# Patient Record
Sex: Female | Born: 1945 | Race: White | Hispanic: No | Smoking: Never smoker
Health system: Southern US, Community
[De-identification: ages and names within clinical notes are randomized; demographics above are authoritative.]

## PROBLEM LIST (undated history)

## (undated) DIAGNOSIS — E7800 Pure hypercholesterolemia, unspecified: Secondary | ICD-10-CM

## (undated) DIAGNOSIS — N63 Unspecified lump in unspecified breast: Secondary | ICD-10-CM

## (undated) DIAGNOSIS — R591 Generalized enlarged lymph nodes: Secondary | ICD-10-CM

## (undated) DIAGNOSIS — E876 Hypokalemia: Secondary | ICD-10-CM

## (undated) DIAGNOSIS — E538 Deficiency of other specified B group vitamins: Secondary | ICD-10-CM

## (undated) DIAGNOSIS — F419 Anxiety disorder, unspecified: Secondary | ICD-10-CM

## (undated) DIAGNOSIS — M25569 Pain in unspecified knee: Secondary | ICD-10-CM

## (undated) DIAGNOSIS — D649 Anemia, unspecified: Secondary | ICD-10-CM

## (undated) DIAGNOSIS — E041 Nontoxic single thyroid nodule: Secondary | ICD-10-CM

## (undated) DIAGNOSIS — E039 Hypothyroidism, unspecified: Secondary | ICD-10-CM

## (undated) DIAGNOSIS — R946 Abnormal results of thyroid function studies: Secondary | ICD-10-CM

## (undated) DIAGNOSIS — N6320 Unspecified lump in the left breast, unspecified quadrant: Secondary | ICD-10-CM

## (undated) DIAGNOSIS — L309 Dermatitis, unspecified: Secondary | ICD-10-CM

## (undated) DIAGNOSIS — M1990 Unspecified osteoarthritis, unspecified site: Secondary | ICD-10-CM

## (undated) DIAGNOSIS — Z780 Asymptomatic menopausal state: Secondary | ICD-10-CM

## (undated) DIAGNOSIS — K7469 Other cirrhosis of liver: Secondary | ICD-10-CM

## (undated) DIAGNOSIS — Z853 Personal history of malignant neoplasm of breast: Secondary | ICD-10-CM

## (undated) DIAGNOSIS — I10 Essential (primary) hypertension: Secondary | ICD-10-CM

## (undated) DIAGNOSIS — C8800 Waldenstrom macroglobulinemia not having achieved remission: Secondary | ICD-10-CM

## (undated) DIAGNOSIS — T4145XA Adverse effect of unspecified anesthetic, initial encounter: Secondary | ICD-10-CM

## (undated) DIAGNOSIS — G4700 Insomnia, unspecified: Secondary | ICD-10-CM

## (undated) DIAGNOSIS — R188 Other ascites: Secondary | ICD-10-CM

## (undated) DIAGNOSIS — M1711 Unilateral primary osteoarthritis, right knee: Secondary | ICD-10-CM

## (undated) DIAGNOSIS — M545 Low back pain, unspecified: Secondary | ICD-10-CM

## (undated) DIAGNOSIS — D472 Monoclonal gammopathy: Secondary | ICD-10-CM

## (undated) DIAGNOSIS — M75120 Complete rotator cuff tear or rupture of unspecified shoulder, not specified as traumatic: Secondary | ICD-10-CM

## (undated) DIAGNOSIS — K8020 Calculus of gallbladder without cholecystitis without obstruction: Secondary | ICD-10-CM

## (undated) DIAGNOSIS — D72829 Elevated white blood cell count, unspecified: Secondary | ICD-10-CM

## (undated) DIAGNOSIS — G8929 Other chronic pain: Secondary | ICD-10-CM

## (undated) DIAGNOSIS — E875 Hyperkalemia: Secondary | ICD-10-CM

## (undated) DIAGNOSIS — R413 Other amnesia: Secondary | ICD-10-CM

## (undated) DIAGNOSIS — R0689 Other abnormalities of breathing: Secondary | ICD-10-CM

## (undated) DIAGNOSIS — R7989 Other specified abnormal findings of blood chemistry: Secondary | ICD-10-CM

## (undated) DIAGNOSIS — R9431 Abnormal electrocardiogram [ECG] [EKG]: Secondary | ICD-10-CM

## (undated) DIAGNOSIS — S32040A Wedge compression fracture of fourth lumbar vertebra, initial encounter for closed fracture: Secondary | ICD-10-CM

## (undated) DIAGNOSIS — T8859XA Other complications of anesthesia, initial encounter: Secondary | ICD-10-CM

## (undated) DIAGNOSIS — N289 Disorder of kidney and ureter, unspecified: Secondary | ICD-10-CM

## (undated) DIAGNOSIS — M549 Dorsalgia, unspecified: Secondary | ICD-10-CM

## (undated) DIAGNOSIS — D7589 Other specified diseases of blood and blood-forming organs: Secondary | ICD-10-CM

## (undated) DIAGNOSIS — R945 Abnormal results of liver function studies: Secondary | ICD-10-CM

## (undated) DIAGNOSIS — R609 Edema, unspecified: Secondary | ICD-10-CM

## (undated) DIAGNOSIS — K7030 Alcoholic cirrhosis of liver without ascites: Secondary | ICD-10-CM

## (undated) HISTORY — DX: Other amnesia: R41.3

## (undated) HISTORY — DX: Other cirrhosis of liver: K74.69

## (undated) HISTORY — DX: Calculus of gallbladder without cholecystitis without obstruction: K80.20

## (undated) HISTORY — DX: Elevated white blood cell count, unspecified: D72.829

## (undated) HISTORY — DX: Insomnia, unspecified: G47.00

## (undated) HISTORY — DX: Anemia, unspecified: D64.9

## (undated) HISTORY — DX: Essential (primary) hypertension: I10

## (undated) HISTORY — DX: Dermatitis, unspecified: L30.9

## (undated) HISTORY — DX: Hyperkalemia: E87.5

## (undated) HISTORY — PX: JOINT REPLACEMENT: SHX530

## (undated) HISTORY — DX: Complete rotator cuff tear or rupture of unspecified shoulder, not specified as traumatic: M75.120

## (undated) HISTORY — PX: TONSILLECTOMY: SUR1361

## (undated) HISTORY — PX: SKIN CANCER EXCISION: SHX779

## (undated) HISTORY — DX: Disorder of kidney and ureter, unspecified: N28.9

## (undated) HISTORY — DX: Low back pain, unspecified: M54.50

## (undated) HISTORY — DX: Waldenstrom macroglobulinemia: C88.0

## (undated) HISTORY — DX: Unspecified lump in the left breast, unspecified quadrant: N63.20

## (undated) HISTORY — DX: Unspecified lump in unspecified breast: N63.0

## (undated) HISTORY — DX: Monoclonal gammopathy: D47.2

## (undated) HISTORY — DX: Asymptomatic menopausal state: Z78.0

## (undated) HISTORY — DX: Unspecified osteoarthritis, unspecified site: M19.90

## (undated) HISTORY — DX: Abnormal electrocardiogram (ECG) (EKG): R94.31

## (undated) HISTORY — DX: Wedge compression fracture of fourth lumbar vertebra, initial encounter for closed fracture: S32.040A

## (undated) HISTORY — DX: Pure hypercholesterolemia, unspecified: E78.00

## (undated) HISTORY — DX: Hypothyroidism, unspecified: E03.9

## (undated) HISTORY — DX: Deficiency of other specified B group vitamins: E53.8

## (undated) HISTORY — DX: Other chronic pain: G89.29

## (undated) HISTORY — DX: Abnormal results of liver function studies: R94.5

## (undated) HISTORY — DX: Alcoholic cirrhosis of liver without ascites: K70.30

## (undated) HISTORY — DX: Pain in unspecified knee: M25.569

## (undated) HISTORY — DX: Abnormal results of thyroid function studies: R94.6

## (undated) HISTORY — DX: Generalized enlarged lymph nodes: R59.1

## (undated) HISTORY — DX: Other ascites: R18.8

## (undated) HISTORY — DX: Other abnormalities of breathing: R06.89

## (undated) HISTORY — DX: Nontoxic single thyroid nodule: E04.1

## (undated) HISTORY — DX: Unilateral primary osteoarthritis, right knee: M17.11

## (undated) HISTORY — DX: Low back pain: M54.5

## (undated) HISTORY — PX: OTHER SURGICAL HISTORY: SHX169

## (undated) HISTORY — DX: Waldenstrom macroglobulinemia not having achieved remission: C88.00

## (undated) HISTORY — DX: Edema, unspecified: R60.9

## (undated) HISTORY — DX: Other specified abnormal findings of blood chemistry: R79.89

## (undated) HISTORY — DX: Other specified diseases of blood and blood-forming organs: D75.89

## (undated) HISTORY — DX: Dorsalgia, unspecified: M54.9

## (undated) HISTORY — DX: Anxiety disorder, unspecified: F41.9

## (undated) HISTORY — DX: Hypokalemia: E87.6

## (undated) HISTORY — DX: Personal history of malignant neoplasm of breast: Z85.3

---

## 2004-12-24 HISTORY — PX: EYE SURGERY: SHX253

## 2012-12-24 HISTORY — PX: BREAST SURGERY: SHX581

## 2015-09-24 HISTORY — PX: LESION REMOVAL: SHX5196

## 2015-09-27 LAB — CBC AND DIFFERENTIAL
HEMATOCRIT: 34 % — AB (ref 36–46)
HEMOGLOBIN: 11.4 g/dL — AB (ref 12.0–16.0)
Platelets: 176 10*3/uL (ref 150–399)
WBC: 6.5 10^3/mL

## 2015-09-27 LAB — HEPATIC FUNCTION PANEL
ALT: 22 U/L (ref 7–35)
AST: 41 U/L — AB (ref 13–35)
Alkaline Phosphatase: 94 U/L (ref 25–125)
Bilirubin, Total: 1 mg/dL

## 2015-09-27 LAB — BASIC METABOLIC PANEL
BUN: 8 mg/dL (ref 4–21)
Creatinine: 0.5 mg/dL (ref 0.5–1.1)
Glucose: 98 mg/dL
POTASSIUM: 4 mmol/L (ref 3.4–5.3)
SODIUM: 134 mmol/L — AB (ref 137–147)

## 2015-09-27 LAB — POCT ERYTHROCYTE SEDIMENTATION RATE, NON-AUTOMATED: Sed Rate: 97 mm

## 2015-11-29 LAB — BASIC METABOLIC PANEL
BUN: 35 mg/dL — AB (ref 4–21)
Creatinine: 0.6 mg/dL (ref 0.5–1.1)
Glucose: 104 mg/dL
Potassium: 4.4 mmol/L (ref 3.4–5.3)
Sodium: 134 mmol/L — AB (ref 137–147)

## 2015-11-29 LAB — HEPATIC FUNCTION PANEL
ALT: 21 U/L (ref 7–35)
AST: 27 U/L (ref 13–35)
Alkaline Phosphatase: 107 U/L (ref 25–125)
Bilirubin, Total: 1.1 mg/dL

## 2015-11-29 LAB — CBC AND DIFFERENTIAL
HCT: 41 % (ref 36–46)
HEMOGLOBIN: 13.5 g/dL (ref 12.0–16.0)
PLATELETS: 143 10*3/uL — AB (ref 150–399)
WBC: 7.6 10*3/mL

## 2016-01-17 DIAGNOSIS — M545 Low back pain: Secondary | ICD-10-CM | POA: Diagnosis not present

## 2016-01-23 DIAGNOSIS — M545 Low back pain: Secondary | ICD-10-CM | POA: Diagnosis not present

## 2016-01-23 DIAGNOSIS — G8929 Other chronic pain: Secondary | ICD-10-CM | POA: Diagnosis not present

## 2016-01-23 DIAGNOSIS — E039 Hypothyroidism, unspecified: Secondary | ICD-10-CM | POA: Diagnosis not present

## 2016-01-23 LAB — TSH: TSH: 0.1 u[IU]/mL — AB (ref 0.41–5.90)

## 2016-01-25 DIAGNOSIS — M545 Low back pain: Secondary | ICD-10-CM | POA: Diagnosis not present

## 2016-01-25 DIAGNOSIS — D519 Vitamin B12 deficiency anemia, unspecified: Secondary | ICD-10-CM | POA: Diagnosis not present

## 2016-01-25 DIAGNOSIS — K7581 Nonalcoholic steatohepatitis (NASH): Secondary | ICD-10-CM | POA: Diagnosis not present

## 2016-01-25 DIAGNOSIS — Z853 Personal history of malignant neoplasm of breast: Secondary | ICD-10-CM | POA: Diagnosis not present

## 2016-01-27 DIAGNOSIS — D519 Vitamin B12 deficiency anemia, unspecified: Secondary | ICD-10-CM | POA: Diagnosis not present

## 2016-01-27 DIAGNOSIS — M545 Low back pain: Secondary | ICD-10-CM | POA: Diagnosis not present

## 2016-01-27 DIAGNOSIS — K7581 Nonalcoholic steatohepatitis (NASH): Secondary | ICD-10-CM | POA: Diagnosis not present

## 2016-01-27 DIAGNOSIS — Z853 Personal history of malignant neoplasm of breast: Secondary | ICD-10-CM | POA: Diagnosis not present

## 2016-01-30 DIAGNOSIS — D519 Vitamin B12 deficiency anemia, unspecified: Secondary | ICD-10-CM | POA: Diagnosis not present

## 2016-01-30 DIAGNOSIS — K7581 Nonalcoholic steatohepatitis (NASH): Secondary | ICD-10-CM | POA: Diagnosis not present

## 2016-01-30 DIAGNOSIS — Z853 Personal history of malignant neoplasm of breast: Secondary | ICD-10-CM | POA: Diagnosis not present

## 2016-01-30 DIAGNOSIS — M545 Low back pain: Secondary | ICD-10-CM | POA: Diagnosis not present

## 2016-02-01 DIAGNOSIS — D519 Vitamin B12 deficiency anemia, unspecified: Secondary | ICD-10-CM | POA: Diagnosis not present

## 2016-02-01 DIAGNOSIS — M545 Low back pain: Secondary | ICD-10-CM | POA: Diagnosis not present

## 2016-02-01 DIAGNOSIS — K7581 Nonalcoholic steatohepatitis (NASH): Secondary | ICD-10-CM | POA: Diagnosis not present

## 2016-02-01 DIAGNOSIS — Z853 Personal history of malignant neoplasm of breast: Secondary | ICD-10-CM | POA: Diagnosis not present

## 2016-02-10 DIAGNOSIS — M545 Low back pain: Secondary | ICD-10-CM | POA: Diagnosis not present

## 2016-02-10 DIAGNOSIS — K7581 Nonalcoholic steatohepatitis (NASH): Secondary | ICD-10-CM | POA: Diagnosis not present

## 2016-02-10 DIAGNOSIS — Z853 Personal history of malignant neoplasm of breast: Secondary | ICD-10-CM | POA: Diagnosis not present

## 2016-02-10 DIAGNOSIS — D519 Vitamin B12 deficiency anemia, unspecified: Secondary | ICD-10-CM | POA: Diagnosis not present

## 2016-02-15 DIAGNOSIS — D519 Vitamin B12 deficiency anemia, unspecified: Secondary | ICD-10-CM | POA: Diagnosis not present

## 2016-02-15 DIAGNOSIS — M545 Low back pain: Secondary | ICD-10-CM | POA: Diagnosis not present

## 2016-02-15 DIAGNOSIS — Z853 Personal history of malignant neoplasm of breast: Secondary | ICD-10-CM | POA: Diagnosis not present

## 2016-02-15 DIAGNOSIS — K7581 Nonalcoholic steatohepatitis (NASH): Secondary | ICD-10-CM | POA: Diagnosis not present

## 2016-02-17 DIAGNOSIS — D519 Vitamin B12 deficiency anemia, unspecified: Secondary | ICD-10-CM | POA: Diagnosis not present

## 2016-02-17 DIAGNOSIS — Z853 Personal history of malignant neoplasm of breast: Secondary | ICD-10-CM | POA: Diagnosis not present

## 2016-02-17 DIAGNOSIS — K7581 Nonalcoholic steatohepatitis (NASH): Secondary | ICD-10-CM | POA: Diagnosis not present

## 2016-02-17 DIAGNOSIS — M545 Low back pain: Secondary | ICD-10-CM | POA: Diagnosis not present

## 2016-02-20 DIAGNOSIS — K7581 Nonalcoholic steatohepatitis (NASH): Secondary | ICD-10-CM | POA: Diagnosis not present

## 2016-02-20 DIAGNOSIS — D519 Vitamin B12 deficiency anemia, unspecified: Secondary | ICD-10-CM | POA: Diagnosis not present

## 2016-02-20 DIAGNOSIS — M545 Low back pain: Secondary | ICD-10-CM | POA: Diagnosis not present

## 2016-02-20 DIAGNOSIS — Z853 Personal history of malignant neoplasm of breast: Secondary | ICD-10-CM | POA: Diagnosis not present

## 2016-02-22 DIAGNOSIS — M545 Low back pain: Secondary | ICD-10-CM | POA: Diagnosis not present

## 2016-02-22 DIAGNOSIS — D519 Vitamin B12 deficiency anemia, unspecified: Secondary | ICD-10-CM | POA: Diagnosis not present

## 2016-02-22 DIAGNOSIS — K7581 Nonalcoholic steatohepatitis (NASH): Secondary | ICD-10-CM | POA: Diagnosis not present

## 2016-02-22 DIAGNOSIS — Z853 Personal history of malignant neoplasm of breast: Secondary | ICD-10-CM | POA: Diagnosis not present

## 2016-02-27 DIAGNOSIS — E039 Hypothyroidism, unspecified: Secondary | ICD-10-CM | POA: Diagnosis not present

## 2016-02-27 DIAGNOSIS — R413 Other amnesia: Secondary | ICD-10-CM | POA: Diagnosis not present

## 2016-02-29 DIAGNOSIS — K7581 Nonalcoholic steatohepatitis (NASH): Secondary | ICD-10-CM | POA: Diagnosis not present

## 2016-02-29 DIAGNOSIS — D519 Vitamin B12 deficiency anemia, unspecified: Secondary | ICD-10-CM | POA: Diagnosis not present

## 2016-02-29 DIAGNOSIS — M545 Low back pain: Secondary | ICD-10-CM | POA: Diagnosis not present

## 2016-02-29 DIAGNOSIS — Z853 Personal history of malignant neoplasm of breast: Secondary | ICD-10-CM | POA: Diagnosis not present

## 2016-03-12 DIAGNOSIS — K766 Portal hypertension: Secondary | ICD-10-CM | POA: Diagnosis not present

## 2016-03-12 DIAGNOSIS — K7031 Alcoholic cirrhosis of liver with ascites: Secondary | ICD-10-CM | POA: Diagnosis not present

## 2016-04-03 DIAGNOSIS — K824 Cholesterolosis of gallbladder: Secondary | ICD-10-CM | POA: Diagnosis not present

## 2016-04-03 DIAGNOSIS — K7469 Other cirrhosis of liver: Secondary | ICD-10-CM | POA: Diagnosis not present

## 2016-04-03 DIAGNOSIS — K8020 Calculus of gallbladder without cholecystitis without obstruction: Secondary | ICD-10-CM | POA: Diagnosis not present

## 2016-04-03 DIAGNOSIS — R188 Other ascites: Secondary | ICD-10-CM | POA: Diagnosis not present

## 2016-04-10 DIAGNOSIS — Z853 Personal history of malignant neoplasm of breast: Secondary | ICD-10-CM | POA: Diagnosis not present

## 2016-04-10 DIAGNOSIS — C880 Waldenstrom macroglobulinemia: Secondary | ICD-10-CM | POA: Diagnosis not present

## 2016-04-10 DIAGNOSIS — E039 Hypothyroidism, unspecified: Secondary | ICD-10-CM | POA: Diagnosis not present

## 2016-04-10 DIAGNOSIS — R188 Other ascites: Secondary | ICD-10-CM | POA: Diagnosis not present

## 2016-04-10 LAB — BASIC METABOLIC PANEL
BUN: 23 mg/dL — AB (ref 4–21)
Creatinine: 0.7 mg/dL (ref 0.5–1.1)
GLUCOSE: 98 mg/dL
POTASSIUM: 4.2 mmol/L (ref 3.4–5.3)
Sodium: 137 mmol/L (ref 137–147)

## 2016-04-10 LAB — CBC AND DIFFERENTIAL
HEMATOCRIT: 38 % (ref 36–46)
Hemoglobin: 12.8 g/dL (ref 12.0–16.0)
Platelets: 157 10*3/uL (ref 150–399)
WBC: 6.1 10*3/mL

## 2016-04-10 LAB — POCT ERYTHROCYTE SEDIMENTATION RATE, NON-AUTOMATED: SED RATE: 81 mm

## 2016-04-30 DIAGNOSIS — N63 Unspecified lump in breast: Secondary | ICD-10-CM | POA: Diagnosis not present

## 2016-04-30 DIAGNOSIS — Z853 Personal history of malignant neoplasm of breast: Secondary | ICD-10-CM | POA: Diagnosis not present

## 2016-04-30 DIAGNOSIS — Z9889 Other specified postprocedural states: Secondary | ICD-10-CM | POA: Diagnosis not present

## 2016-04-30 DIAGNOSIS — Z923 Personal history of irradiation: Secondary | ICD-10-CM | POA: Diagnosis not present

## 2016-04-30 LAB — HM MAMMOGRAPHY

## 2016-07-02 DIAGNOSIS — R609 Edema, unspecified: Secondary | ICD-10-CM | POA: Diagnosis not present

## 2016-07-02 DIAGNOSIS — R188 Other ascites: Secondary | ICD-10-CM | POA: Diagnosis not present

## 2016-07-02 DIAGNOSIS — K766 Portal hypertension: Secondary | ICD-10-CM | POA: Diagnosis not present

## 2016-07-02 DIAGNOSIS — K7030 Alcoholic cirrhosis of liver without ascites: Secondary | ICD-10-CM | POA: Diagnosis not present

## 2016-07-02 HISTORY — DX: Alcoholic cirrhosis of liver without ascites: K70.30

## 2016-07-10 DIAGNOSIS — C880 Waldenstrom macroglobulinemia: Secondary | ICD-10-CM | POA: Diagnosis not present

## 2016-07-10 DIAGNOSIS — D472 Monoclonal gammopathy: Secondary | ICD-10-CM | POA: Diagnosis not present

## 2016-07-10 DIAGNOSIS — R188 Other ascites: Secondary | ICD-10-CM | POA: Diagnosis not present

## 2016-07-10 DIAGNOSIS — M549 Dorsalgia, unspecified: Secondary | ICD-10-CM | POA: Diagnosis not present

## 2016-07-10 DIAGNOSIS — Z853 Personal history of malignant neoplasm of breast: Secondary | ICD-10-CM | POA: Diagnosis not present

## 2016-07-10 LAB — POCT ERYTHROCYTE SEDIMENTATION RATE, NON-AUTOMATED: Sed Rate: 83 mm

## 2016-07-10 LAB — CBC AND DIFFERENTIAL
HCT: 38 % (ref 36–46)
Hemoglobin: 13 g/dL (ref 12.0–16.0)
Platelets: 162 10*3/uL (ref 150–399)
WBC: 7.3 10*3/mL

## 2016-08-01 DIAGNOSIS — E538 Deficiency of other specified B group vitamins: Secondary | ICD-10-CM | POA: Diagnosis not present

## 2016-08-01 DIAGNOSIS — M25511 Pain in right shoulder: Secondary | ICD-10-CM | POA: Diagnosis not present

## 2016-08-07 DIAGNOSIS — Z780 Asymptomatic menopausal state: Secondary | ICD-10-CM | POA: Diagnosis not present

## 2016-08-07 LAB — HM DEXA SCAN

## 2016-08-09 DIAGNOSIS — M25511 Pain in right shoulder: Secondary | ICD-10-CM | POA: Diagnosis not present

## 2016-08-09 DIAGNOSIS — M75121 Complete rotator cuff tear or rupture of right shoulder, not specified as traumatic: Secondary | ICD-10-CM | POA: Diagnosis not present

## 2016-08-09 DIAGNOSIS — M1711 Unilateral primary osteoarthritis, right knee: Secondary | ICD-10-CM | POA: Diagnosis not present

## 2016-08-30 DIAGNOSIS — M1711 Unilateral primary osteoarthritis, right knee: Secondary | ICD-10-CM | POA: Diagnosis not present

## 2016-09-05 DIAGNOSIS — M1711 Unilateral primary osteoarthritis, right knee: Secondary | ICD-10-CM | POA: Diagnosis present

## 2016-09-05 DIAGNOSIS — M25561 Pain in right knee: Secondary | ICD-10-CM | POA: Diagnosis not present

## 2016-09-05 DIAGNOSIS — G8918 Other acute postprocedural pain: Secondary | ICD-10-CM | POA: Diagnosis not present

## 2016-09-05 DIAGNOSIS — Z23 Encounter for immunization: Secondary | ICD-10-CM | POA: Diagnosis not present

## 2016-09-05 HISTORY — PX: TOTAL KNEE ARTHROPLASTY: SHX125

## 2016-09-08 DIAGNOSIS — Z471 Aftercare following joint replacement surgery: Secondary | ICD-10-CM | POA: Diagnosis not present

## 2016-09-08 DIAGNOSIS — M1711 Unilateral primary osteoarthritis, right knee: Secondary | ICD-10-CM | POA: Diagnosis not present

## 2016-09-08 DIAGNOSIS — D519 Vitamin B12 deficiency anemia, unspecified: Secondary | ICD-10-CM | POA: Diagnosis not present

## 2016-09-08 DIAGNOSIS — Z96651 Presence of right artificial knee joint: Secondary | ICD-10-CM | POA: Diagnosis not present

## 2016-09-08 DIAGNOSIS — Z853 Personal history of malignant neoplasm of breast: Secondary | ICD-10-CM | POA: Diagnosis not present

## 2016-09-08 DIAGNOSIS — K7581 Nonalcoholic steatohepatitis (NASH): Secondary | ICD-10-CM | POA: Diagnosis not present

## 2016-09-10 DIAGNOSIS — M1711 Unilateral primary osteoarthritis, right knee: Secondary | ICD-10-CM | POA: Diagnosis not present

## 2016-09-10 DIAGNOSIS — Z471 Aftercare following joint replacement surgery: Secondary | ICD-10-CM | POA: Diagnosis not present

## 2016-09-10 DIAGNOSIS — Z853 Personal history of malignant neoplasm of breast: Secondary | ICD-10-CM | POA: Diagnosis not present

## 2016-09-10 DIAGNOSIS — D519 Vitamin B12 deficiency anemia, unspecified: Secondary | ICD-10-CM | POA: Diagnosis not present

## 2016-09-10 DIAGNOSIS — K7581 Nonalcoholic steatohepatitis (NASH): Secondary | ICD-10-CM | POA: Diagnosis not present

## 2016-09-10 DIAGNOSIS — Z96651 Presence of right artificial knee joint: Secondary | ICD-10-CM | POA: Diagnosis not present

## 2016-09-12 DIAGNOSIS — D519 Vitamin B12 deficiency anemia, unspecified: Secondary | ICD-10-CM | POA: Diagnosis not present

## 2016-09-12 DIAGNOSIS — Z471 Aftercare following joint replacement surgery: Secondary | ICD-10-CM | POA: Diagnosis not present

## 2016-09-12 DIAGNOSIS — Z96651 Presence of right artificial knee joint: Secondary | ICD-10-CM | POA: Diagnosis not present

## 2016-09-12 DIAGNOSIS — K7581 Nonalcoholic steatohepatitis (NASH): Secondary | ICD-10-CM | POA: Diagnosis not present

## 2016-09-12 DIAGNOSIS — M1711 Unilateral primary osteoarthritis, right knee: Secondary | ICD-10-CM | POA: Diagnosis not present

## 2016-09-12 DIAGNOSIS — Z853 Personal history of malignant neoplasm of breast: Secondary | ICD-10-CM | POA: Diagnosis not present

## 2016-09-14 DIAGNOSIS — K7581 Nonalcoholic steatohepatitis (NASH): Secondary | ICD-10-CM | POA: Diagnosis not present

## 2016-09-14 DIAGNOSIS — D519 Vitamin B12 deficiency anemia, unspecified: Secondary | ICD-10-CM | POA: Diagnosis not present

## 2016-09-14 DIAGNOSIS — Z96651 Presence of right artificial knee joint: Secondary | ICD-10-CM | POA: Diagnosis not present

## 2016-09-14 DIAGNOSIS — Z853 Personal history of malignant neoplasm of breast: Secondary | ICD-10-CM | POA: Diagnosis not present

## 2016-09-14 DIAGNOSIS — M1711 Unilateral primary osteoarthritis, right knee: Secondary | ICD-10-CM | POA: Diagnosis not present

## 2016-09-14 DIAGNOSIS — Z471 Aftercare following joint replacement surgery: Secondary | ICD-10-CM | POA: Diagnosis not present

## 2016-09-17 DIAGNOSIS — Z96651 Presence of right artificial knee joint: Secondary | ICD-10-CM | POA: Diagnosis not present

## 2016-09-17 DIAGNOSIS — Z471 Aftercare following joint replacement surgery: Secondary | ICD-10-CM | POA: Diagnosis not present

## 2016-09-17 DIAGNOSIS — K7581 Nonalcoholic steatohepatitis (NASH): Secondary | ICD-10-CM | POA: Diagnosis not present

## 2016-09-17 DIAGNOSIS — M1711 Unilateral primary osteoarthritis, right knee: Secondary | ICD-10-CM | POA: Diagnosis not present

## 2016-09-17 DIAGNOSIS — Z853 Personal history of malignant neoplasm of breast: Secondary | ICD-10-CM | POA: Diagnosis not present

## 2016-09-17 DIAGNOSIS — D519 Vitamin B12 deficiency anemia, unspecified: Secondary | ICD-10-CM | POA: Diagnosis not present

## 2016-09-19 DIAGNOSIS — D519 Vitamin B12 deficiency anemia, unspecified: Secondary | ICD-10-CM | POA: Diagnosis not present

## 2016-09-19 DIAGNOSIS — K7581 Nonalcoholic steatohepatitis (NASH): Secondary | ICD-10-CM | POA: Diagnosis not present

## 2016-09-19 DIAGNOSIS — Z471 Aftercare following joint replacement surgery: Secondary | ICD-10-CM | POA: Diagnosis not present

## 2016-09-19 DIAGNOSIS — Z96651 Presence of right artificial knee joint: Secondary | ICD-10-CM | POA: Diagnosis not present

## 2016-09-19 DIAGNOSIS — M1711 Unilateral primary osteoarthritis, right knee: Secondary | ICD-10-CM | POA: Diagnosis not present

## 2016-09-19 DIAGNOSIS — Z853 Personal history of malignant neoplasm of breast: Secondary | ICD-10-CM | POA: Diagnosis not present

## 2016-09-20 DIAGNOSIS — Z853 Personal history of malignant neoplasm of breast: Secondary | ICD-10-CM | POA: Diagnosis not present

## 2016-09-20 DIAGNOSIS — Z471 Aftercare following joint replacement surgery: Secondary | ICD-10-CM | POA: Diagnosis not present

## 2016-09-20 DIAGNOSIS — M1711 Unilateral primary osteoarthritis, right knee: Secondary | ICD-10-CM | POA: Diagnosis not present

## 2016-09-20 DIAGNOSIS — Z96651 Presence of right artificial knee joint: Secondary | ICD-10-CM | POA: Diagnosis not present

## 2016-09-20 DIAGNOSIS — D519 Vitamin B12 deficiency anemia, unspecified: Secondary | ICD-10-CM | POA: Diagnosis not present

## 2016-09-20 DIAGNOSIS — K7581 Nonalcoholic steatohepatitis (NASH): Secondary | ICD-10-CM | POA: Diagnosis not present

## 2016-09-24 DIAGNOSIS — D519 Vitamin B12 deficiency anemia, unspecified: Secondary | ICD-10-CM | POA: Diagnosis not present

## 2016-09-24 DIAGNOSIS — K7581 Nonalcoholic steatohepatitis (NASH): Secondary | ICD-10-CM | POA: Diagnosis not present

## 2016-09-24 DIAGNOSIS — M1711 Unilateral primary osteoarthritis, right knee: Secondary | ICD-10-CM | POA: Diagnosis not present

## 2016-09-24 DIAGNOSIS — Z471 Aftercare following joint replacement surgery: Secondary | ICD-10-CM | POA: Diagnosis not present

## 2016-09-24 DIAGNOSIS — Z96651 Presence of right artificial knee joint: Secondary | ICD-10-CM | POA: Diagnosis not present

## 2016-09-24 DIAGNOSIS — Z853 Personal history of malignant neoplasm of breast: Secondary | ICD-10-CM | POA: Diagnosis not present

## 2016-09-26 DIAGNOSIS — M1711 Unilateral primary osteoarthritis, right knee: Secondary | ICD-10-CM | POA: Diagnosis not present

## 2016-09-26 DIAGNOSIS — Z471 Aftercare following joint replacement surgery: Secondary | ICD-10-CM | POA: Diagnosis not present

## 2016-09-26 DIAGNOSIS — D519 Vitamin B12 deficiency anemia, unspecified: Secondary | ICD-10-CM | POA: Diagnosis not present

## 2016-09-26 DIAGNOSIS — Z96651 Presence of right artificial knee joint: Secondary | ICD-10-CM | POA: Diagnosis not present

## 2016-09-26 DIAGNOSIS — Z853 Personal history of malignant neoplasm of breast: Secondary | ICD-10-CM | POA: Diagnosis not present

## 2016-09-26 DIAGNOSIS — K7581 Nonalcoholic steatohepatitis (NASH): Secondary | ICD-10-CM | POA: Diagnosis not present

## 2016-09-28 DIAGNOSIS — K7581 Nonalcoholic steatohepatitis (NASH): Secondary | ICD-10-CM | POA: Diagnosis not present

## 2016-09-28 DIAGNOSIS — Z471 Aftercare following joint replacement surgery: Secondary | ICD-10-CM | POA: Diagnosis not present

## 2016-09-28 DIAGNOSIS — Z853 Personal history of malignant neoplasm of breast: Secondary | ICD-10-CM | POA: Diagnosis not present

## 2016-09-28 DIAGNOSIS — D519 Vitamin B12 deficiency anemia, unspecified: Secondary | ICD-10-CM | POA: Diagnosis not present

## 2016-09-28 DIAGNOSIS — Z96651 Presence of right artificial knee joint: Secondary | ICD-10-CM | POA: Diagnosis not present

## 2016-09-28 DIAGNOSIS — M1711 Unilateral primary osteoarthritis, right knee: Secondary | ICD-10-CM | POA: Diagnosis not present

## 2016-10-01 DIAGNOSIS — Z96651 Presence of right artificial knee joint: Secondary | ICD-10-CM | POA: Diagnosis not present

## 2016-10-01 DIAGNOSIS — D519 Vitamin B12 deficiency anemia, unspecified: Secondary | ICD-10-CM | POA: Diagnosis not present

## 2016-10-01 DIAGNOSIS — Z471 Aftercare following joint replacement surgery: Secondary | ICD-10-CM | POA: Diagnosis not present

## 2016-10-01 DIAGNOSIS — M1711 Unilateral primary osteoarthritis, right knee: Secondary | ICD-10-CM | POA: Diagnosis not present

## 2016-10-01 DIAGNOSIS — Z853 Personal history of malignant neoplasm of breast: Secondary | ICD-10-CM | POA: Diagnosis not present

## 2016-10-01 DIAGNOSIS — K7581 Nonalcoholic steatohepatitis (NASH): Secondary | ICD-10-CM | POA: Diagnosis not present

## 2016-10-03 DIAGNOSIS — Z96651 Presence of right artificial knee joint: Secondary | ICD-10-CM | POA: Diagnosis not present

## 2016-10-03 DIAGNOSIS — D519 Vitamin B12 deficiency anemia, unspecified: Secondary | ICD-10-CM | POA: Diagnosis not present

## 2016-10-03 DIAGNOSIS — K7581 Nonalcoholic steatohepatitis (NASH): Secondary | ICD-10-CM | POA: Diagnosis not present

## 2016-10-03 DIAGNOSIS — Z853 Personal history of malignant neoplasm of breast: Secondary | ICD-10-CM | POA: Diagnosis not present

## 2016-10-03 DIAGNOSIS — M1711 Unilateral primary osteoarthritis, right knee: Secondary | ICD-10-CM | POA: Diagnosis not present

## 2016-10-03 DIAGNOSIS — Z471 Aftercare following joint replacement surgery: Secondary | ICD-10-CM | POA: Diagnosis not present

## 2016-10-05 DIAGNOSIS — Z471 Aftercare following joint replacement surgery: Secondary | ICD-10-CM | POA: Diagnosis not present

## 2016-10-05 DIAGNOSIS — D519 Vitamin B12 deficiency anemia, unspecified: Secondary | ICD-10-CM | POA: Diagnosis not present

## 2016-10-05 DIAGNOSIS — Z853 Personal history of malignant neoplasm of breast: Secondary | ICD-10-CM | POA: Diagnosis not present

## 2016-10-05 DIAGNOSIS — K7581 Nonalcoholic steatohepatitis (NASH): Secondary | ICD-10-CM | POA: Diagnosis not present

## 2016-10-05 DIAGNOSIS — M1711 Unilateral primary osteoarthritis, right knee: Secondary | ICD-10-CM | POA: Diagnosis not present

## 2016-10-05 DIAGNOSIS — Z96651 Presence of right artificial knee joint: Secondary | ICD-10-CM | POA: Diagnosis not present

## 2016-10-11 DIAGNOSIS — M1711 Unilateral primary osteoarthritis, right knee: Secondary | ICD-10-CM | POA: Diagnosis not present

## 2016-10-11 DIAGNOSIS — D519 Vitamin B12 deficiency anemia, unspecified: Secondary | ICD-10-CM | POA: Diagnosis not present

## 2016-10-11 DIAGNOSIS — K7581 Nonalcoholic steatohepatitis (NASH): Secondary | ICD-10-CM | POA: Diagnosis not present

## 2016-10-11 DIAGNOSIS — Z96651 Presence of right artificial knee joint: Secondary | ICD-10-CM | POA: Diagnosis not present

## 2016-10-11 DIAGNOSIS — Z853 Personal history of malignant neoplasm of breast: Secondary | ICD-10-CM | POA: Diagnosis not present

## 2016-10-11 DIAGNOSIS — Z471 Aftercare following joint replacement surgery: Secondary | ICD-10-CM | POA: Diagnosis not present

## 2016-10-12 DIAGNOSIS — M1711 Unilateral primary osteoarthritis, right knee: Secondary | ICD-10-CM | POA: Diagnosis not present

## 2016-10-12 DIAGNOSIS — Z853 Personal history of malignant neoplasm of breast: Secondary | ICD-10-CM | POA: Diagnosis not present

## 2016-10-12 DIAGNOSIS — K7581 Nonalcoholic steatohepatitis (NASH): Secondary | ICD-10-CM | POA: Diagnosis not present

## 2016-10-12 DIAGNOSIS — D519 Vitamin B12 deficiency anemia, unspecified: Secondary | ICD-10-CM | POA: Diagnosis not present

## 2016-10-12 DIAGNOSIS — Z471 Aftercare following joint replacement surgery: Secondary | ICD-10-CM | POA: Diagnosis not present

## 2016-10-12 DIAGNOSIS — Z96651 Presence of right artificial knee joint: Secondary | ICD-10-CM | POA: Diagnosis not present

## 2016-10-15 DIAGNOSIS — D519 Vitamin B12 deficiency anemia, unspecified: Secondary | ICD-10-CM | POA: Diagnosis not present

## 2016-10-15 DIAGNOSIS — Z96651 Presence of right artificial knee joint: Secondary | ICD-10-CM | POA: Diagnosis not present

## 2016-10-15 DIAGNOSIS — M1711 Unilateral primary osteoarthritis, right knee: Secondary | ICD-10-CM | POA: Diagnosis not present

## 2016-10-15 DIAGNOSIS — K7581 Nonalcoholic steatohepatitis (NASH): Secondary | ICD-10-CM | POA: Diagnosis not present

## 2016-10-15 DIAGNOSIS — Z471 Aftercare following joint replacement surgery: Secondary | ICD-10-CM | POA: Diagnosis not present

## 2016-10-15 DIAGNOSIS — Z853 Personal history of malignant neoplasm of breast: Secondary | ICD-10-CM | POA: Diagnosis not present

## 2016-10-19 DIAGNOSIS — K7581 Nonalcoholic steatohepatitis (NASH): Secondary | ICD-10-CM | POA: Diagnosis not present

## 2016-10-19 DIAGNOSIS — Z96651 Presence of right artificial knee joint: Secondary | ICD-10-CM | POA: Diagnosis not present

## 2016-10-19 DIAGNOSIS — M1711 Unilateral primary osteoarthritis, right knee: Secondary | ICD-10-CM | POA: Diagnosis not present

## 2016-10-19 DIAGNOSIS — Z853 Personal history of malignant neoplasm of breast: Secondary | ICD-10-CM | POA: Diagnosis not present

## 2016-10-19 DIAGNOSIS — Z471 Aftercare following joint replacement surgery: Secondary | ICD-10-CM | POA: Diagnosis not present

## 2016-10-19 DIAGNOSIS — D519 Vitamin B12 deficiency anemia, unspecified: Secondary | ICD-10-CM | POA: Diagnosis not present

## 2016-11-09 DIAGNOSIS — K7030 Alcoholic cirrhosis of liver without ascites: Secondary | ICD-10-CM | POA: Diagnosis not present

## 2016-11-09 DIAGNOSIS — N200 Calculus of kidney: Secondary | ICD-10-CM | POA: Diagnosis not present

## 2016-11-23 DIAGNOSIS — E039 Hypothyroidism, unspecified: Secondary | ICD-10-CM | POA: Diagnosis not present

## 2016-11-30 DIAGNOSIS — Z96651 Presence of right artificial knee joint: Secondary | ICD-10-CM | POA: Diagnosis not present

## 2016-11-30 DIAGNOSIS — M25511 Pain in right shoulder: Secondary | ICD-10-CM | POA: Diagnosis not present

## 2016-12-07 DIAGNOSIS — D7589 Other specified diseases of blood and blood-forming organs: Secondary | ICD-10-CM | POA: Diagnosis not present

## 2016-12-07 DIAGNOSIS — C880 Waldenstrom macroglobulinemia: Secondary | ICD-10-CM | POA: Diagnosis not present

## 2016-12-07 DIAGNOSIS — K7469 Other cirrhosis of liver: Secondary | ICD-10-CM | POA: Diagnosis not present

## 2016-12-07 DIAGNOSIS — D72829 Elevated white blood cell count, unspecified: Secondary | ICD-10-CM | POA: Diagnosis not present

## 2016-12-07 DIAGNOSIS — Z853 Personal history of malignant neoplasm of breast: Secondary | ICD-10-CM | POA: Diagnosis not present

## 2016-12-07 LAB — HEPATIC FUNCTION PANEL
ALK PHOS: 103 U/L (ref 25–125)
ALT: 51 U/L — AB (ref 7–35)
AST: 33 U/L (ref 13–35)
Bilirubin, Total: 0.5 mg/dL

## 2016-12-07 LAB — CBC AND DIFFERENTIAL
HCT: 44 % (ref 36–46)
Hemoglobin: 14.7 g/dL (ref 12.0–16.0)
Platelets: 276 10*3/uL (ref 150–399)
WBC: 15.4 10^3/mL

## 2016-12-07 LAB — BASIC METABOLIC PANEL
BUN: 18 mg/dL (ref 4–21)
CREATININE: 0.7 mg/dL (ref 0.5–1.1)
Glucose: 111 mg/dL
Potassium: 5 mmol/L (ref 3.4–5.3)
SODIUM: 134 mmol/L — AB (ref 137–147)

## 2016-12-07 LAB — TSH: TSH: 1.5 u[IU]/mL (ref 0.41–5.90)

## 2016-12-10 DIAGNOSIS — Z0000 Encounter for general adult medical examination without abnormal findings: Secondary | ICD-10-CM | POA: Diagnosis not present

## 2017-01-04 DIAGNOSIS — K7030 Alcoholic cirrhosis of liver without ascites: Secondary | ICD-10-CM | POA: Diagnosis not present

## 2017-01-04 DIAGNOSIS — R188 Other ascites: Secondary | ICD-10-CM | POA: Diagnosis not present

## 2017-01-04 DIAGNOSIS — R609 Edema, unspecified: Secondary | ICD-10-CM | POA: Diagnosis not present

## 2017-01-04 DIAGNOSIS — K766 Portal hypertension: Secondary | ICD-10-CM | POA: Diagnosis not present

## 2017-01-14 DIAGNOSIS — M1711 Unilateral primary osteoarthritis, right knee: Secondary | ICD-10-CM | POA: Diagnosis not present

## 2017-01-14 DIAGNOSIS — M25661 Stiffness of right knee, not elsewhere classified: Secondary | ICD-10-CM | POA: Diagnosis not present

## 2017-01-14 DIAGNOSIS — Z9889 Other specified postprocedural states: Secondary | ICD-10-CM | POA: Diagnosis not present

## 2017-01-14 DIAGNOSIS — R262 Difficulty in walking, not elsewhere classified: Secondary | ICD-10-CM | POA: Diagnosis not present

## 2017-01-18 DIAGNOSIS — M1711 Unilateral primary osteoarthritis, right knee: Secondary | ICD-10-CM | POA: Diagnosis not present

## 2017-01-18 DIAGNOSIS — M75121 Complete rotator cuff tear or rupture of right shoulder, not specified as traumatic: Secondary | ICD-10-CM | POA: Diagnosis not present

## 2017-01-18 DIAGNOSIS — Z96651 Presence of right artificial knee joint: Secondary | ICD-10-CM | POA: Diagnosis not present

## 2017-04-22 ENCOUNTER — Encounter: Payer: Self-pay | Admitting: Gastroenterology

## 2017-05-06 ENCOUNTER — Encounter: Payer: Self-pay | Admitting: Gastroenterology

## 2017-05-06 ENCOUNTER — Ambulatory Visit (INDEPENDENT_AMBULATORY_CARE_PROVIDER_SITE_OTHER): Payer: Medicare Other | Admitting: Gastroenterology

## 2017-05-06 ENCOUNTER — Encounter (INDEPENDENT_AMBULATORY_CARE_PROVIDER_SITE_OTHER): Payer: Self-pay

## 2017-05-06 ENCOUNTER — Other Ambulatory Visit (INDEPENDENT_AMBULATORY_CARE_PROVIDER_SITE_OTHER): Payer: Medicare Other

## 2017-05-06 VITALS — BP 102/86 | HR 80 | Ht <= 58 in | Wt 189.4 lb

## 2017-05-06 DIAGNOSIS — K7031 Alcoholic cirrhosis of liver with ascites: Secondary | ICD-10-CM | POA: Diagnosis not present

## 2017-05-06 DIAGNOSIS — I8500 Esophageal varices without bleeding: Secondary | ICD-10-CM

## 2017-05-06 LAB — COMPREHENSIVE METABOLIC PANEL
ALT: 26 U/L (ref 0–35)
AST: 30 U/L (ref 0–37)
Albumin: 3.8 g/dL (ref 3.5–5.2)
Alkaline Phosphatase: 90 U/L (ref 39–117)
BUN: 26 mg/dL — AB (ref 6–23)
CO2: 29 mEq/L (ref 19–32)
Calcium: 9.8 mg/dL (ref 8.4–10.5)
Chloride: 99 mEq/L (ref 96–112)
Creatinine, Ser: 0.93 mg/dL (ref 0.40–1.20)
GFR: 62.92 mL/min (ref >60.00)
GLUCOSE: 91 mg/dL (ref 70–99)
POTASSIUM: 4.7 meq/L (ref 3.5–5.1)
Sodium: 135 mEq/L (ref 135–145)
Total Bilirubin: 0.7 mg/dL (ref 0.2–1.2)
Total Protein: 8.1 g/dL (ref 6.0–8.3)

## 2017-05-06 LAB — CBC WITH DIFFERENTIAL/PLATELET
BASOS PCT: 1 % (ref 0.0–3.0)
Basophils Absolute: 0.1 10*3/uL (ref 0.0–0.1)
EOS PCT: 1.3 % (ref 0.0–5.0)
Eosinophils Absolute: 0.1 10*3/uL (ref 0.0–0.7)
HEMATOCRIT: 38.9 % (ref 36.0–46.0)
HEMOGLOBIN: 13.3 g/dL (ref 12.0–15.0)
Lymphocytes Relative: 30.2 % (ref 12.0–46.0)
Lymphs Abs: 3.1 10*3/uL (ref 0.7–4.0)
MCHC: 34.3 g/dL (ref 30.0–36.0)
MCV: 90.1 fl (ref 78.0–100.0)
MONOS PCT: 10.4 % (ref 3.0–12.0)
Monocytes Absolute: 1.1 10*3/uL — ABNORMAL HIGH (ref 0.1–1.0)
Neutro Abs: 5.9 10*3/uL (ref 1.4–7.7)
Neutrophils Relative %: 57.1 % (ref 43.0–77.0)
PLATELETS: 258 10*3/uL (ref 150.0–400.0)
RBC: 4.32 Mil/uL (ref 3.87–5.11)
RDW: 12.6 % (ref 11.5–15.5)
WBC: 10.2 10*3/uL (ref 4.0–10.5)

## 2017-05-06 LAB — PROTIME-INR
INR: 1.1 ratio — ABNORMAL HIGH (ref 0.8–1.0)
Prothrombin Time: 12.3 s (ref 9.6–13.1)

## 2017-05-06 NOTE — Patient Instructions (Signed)
If you are age 73 or older, your body mass index should be between 23-30. Your Body mass index is 39.75 kg/m. If this is out of the aforementioned range listed, please consider follow up with your Primary Care Provider.  If you are age 54 or younger, your body mass index should be between 19-25. Your Body mass index is 39.75 kg/m. If this is out of the aformentioned range listed, please consider follow up with your Primary Care Provider.   Your physician has requested that you go to the basement for the following lab work before leaving today:  CBC, CMET, AFP, INR  You have been scheduled for an abdominal ultrasound at [facility] (1st floor of hospital) on Wednesday, May 23rd at 9:00am. Please arrive 15 minutes prior to your appointment for registration. Make certain not to have anything to eat or drink after midnight the night of your appointment. Should you need to reschedule your appointment, please contact radiology at [phone]. This test typically takes about 30 minutes to perform.  Thank you.

## 2017-05-06 NOTE — Progress Notes (Signed)
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  She reports being diagnosed in [place] about 2 years ago or so. She was told she has fatty liver related cirrhosis, likely related to alcohol use - she reported a history of scotch, 4-5 drinks / day, she thinks ongoing for several years. She does not drink alcohol any longer. She thinks she may have had a prior liver biopsy but is not sure. She endorses having an extensive lab evaluation, we have no records to review today.  She thinks her mother had liver disease but she is not sure. No FH of [place].   History of ascites requiring paracentesis, last paracentesis was several months ago but endorses some weight gain over several months, she thinks due to retained fluid. She is taking lasix 50m q day / aldactone 1035mq day. She thinks perhaps some LE edema. She reports a history of varices taking propranolol, but only once per day. No history of jaundice. She is taking lactulose taking once daily and titrating as needed. Son present for this history / exam today, states mental status seems normal. She does take sleep aids periodically and occasional narcotics for chronic pain.  She's had a colonoscopy / endoscopy within the past year, not sure of the results..   She's had [facility] in November 2017 from what she can remember with [place]  Past Medical History:  Diagnosis Date  . Abnormal EKG   . Abnormal finding on thyroid function test   . Abnormal liver function test   . Acute edema   . 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 Anemia   . Anxiety   . Arthritis   . Ascites   . Back pain   . Breast mass   . Chronic low back pain   . Complete right rotator cuff tear   . Compression fracture of L4 lumbar vertebra (HCC)   . Dermatitis, eczematoid   . Difficulty breathing   . Gallstones   . Hepatitis   .  History of adenocarcinoma of breast   . Hypercholesterolemia   . Hyperkalemia   . Hypertension   . Hypokalemia   . Hypothyroidism   . Insomnia   . Knee pain   . Leukocytosis   . Lymphadenopathy   . Macrocytosis   . Memory loss or impairment   . Menopause   . MGUS (monoclonal gammopathy of unknown significance)   . Osteoarthritis   . Osteoarthritis of right knee   . Other cirrhosis of liver ([place]  . Renal insufficiency syndrome   . Right knee DJD   . Solitary thyroid nodule   . Unspecified lump in the left breast, unspecified quadrant   . Vitamin B 12 deficiency   . 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[facility]ation     Past Surgical History:  Procedure Laterality Date  . BREAST SURGERY  2014   Removed lymphnodes  . LESION REMOVAL  09/2015   tubular adenoma-4 subcentimeter lesions  . TOTAL KNEE ARTHROPLASTY Right 09/05/2016   Family History  Problem Relation Age of Onset  . Colon cancer Neg Hx   . Stomach cancer Neg Hx   . Rectal cancer Neg Hx   . Liver cancer Neg Hx   . Esophageal cancer Neg Hx    Social History  Substance Use Topics  . Smoking status: Never Smoker  .  Smokeless tobacco: Never Used  . Alcohol use No   No current outpatient prescriptions on file.   No current facility-administered medications for this visit.    No Known Allergies   Review of Systems: All systems reviewed and negative except where noted in HPI.   Lab Results  Component Value Date   WBC 15.4 12/07/2016   HGB 14.7 12/07/2016   HCT 44 12/07/2016   PLT 276 12/07/2016   Lab Results  Component Value Date   CREATININE 0.7 12/07/2016   BUN 18 12/07/2016   NA 134 (A) 12/07/2016   K 5.0 12/07/2016    Lab Results  Component Value Date   ALT 51 (A) 12/07/2016   AST 33 12/07/2016   ALKPHOS 103 12/07/2016     Physical Exam: BP 102/86   Pulse 80   Ht 4' 9.87" (1.47 m)   Wt 189 lb 6 oz (85.9 kg)   BMI 39.75 kg/m  Constitutional: Pleasant,well-developed, female in no acute  distress. HEENT: Normocephalic and atraumatic. Conjunctivae are normal. No scleral icterus. Neck supple.  Cardiovascular: Normal rate, regular rhythm. 2/6 SEM Pulmonary/chest: Effort normal and breath sounds normal. No wheezing, rales or rhonchi. Abdominal: Soft, protuberant / obese abdomen, nondistended, nontender. There are no masses palpable. No hepatomegaly. Extremities: no significant edema Lymphadenopathy: No cervical adenopathy noted. Neurological: Alert and oriented to person place and time. No asterixis Skin: Skin is warm and dry. No rashes noted. Psychiatric: Normal mood and affect. Behavior is normal.   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 Overall she appears stable at this time symptomatically although concerned about slowly progressive ascites. She has a soft abdomen and does not warrant paracentesis at this time. I discussed cirrhosis, natural history and risks of decompensation and HCC with the patient and her son at length today. We discussed etiologies for cirrhosis, based on her alcohol history I suspect this is very likely cause, however we will obtain records from her prior workup to determine what has been done and provide vaccinations if needed. Of note she's on a low dose of propranolol, not sure if this is due to her ascites, may adjust this over time as tolerated.  At this time recommend the following: - obtain records from her GI in [place], Dr. Driscilla Moats - in include notes, labs, and prior endoscopies - basic labs - CBC, CMET, INR - [place] screening with [place] and AFP level - pending labs may increase diuretics as tolerated for ascites - may increase propranolol dose over time if management of ascites successful - continue alcohol cessation - follow up in 6 months or sooner with changes in her status  Dupont Cellar, MD [facility]  Gastroenterology Pager [phone]

## 2017-05-07 LAB — AFP TUMOR MARKER: AFP-Tumor Marker: 2.8 ng/mL (ref <6.1)

## 2017-05-09 NOTE — Addendum Note (Signed)
Addended by: Denyse Amass on: 05/09/2017 04:41 PM   Modules accepted: Orders

## 2017-05-15 ENCOUNTER — Ambulatory Visit (HOSPITAL_COMMUNITY)
Admission: RE | Admit: 2017-05-15 | Discharge: 2017-05-15 | Disposition: A | Discharge status: Home / Self Care | Payer: Medicare Other | Source: Ambulatory Visit | Attending: Gastroenterology | Admitting: Gastroenterology

## 2017-05-15 DIAGNOSIS — K7031 Alcoholic cirrhosis of liver with ascites: Secondary | ICD-10-CM

## 2017-05-15 DIAGNOSIS — I8500 Esophageal varices without bleeding: Secondary | ICD-10-CM

## 2017-05-15 DIAGNOSIS — K769 Liver disease, unspecified: Secondary | ICD-10-CM | POA: Diagnosis not present

## 2017-05-15 DIAGNOSIS — N2889 Other specified disorders of kidney and ureter: Secondary | ICD-10-CM | POA: Insufficient documentation

## 2017-05-15 DIAGNOSIS — K8020 Calculus of gallbladder without cholecystitis without obstruction: Secondary | ICD-10-CM | POA: Insufficient documentation

## 2017-05-17 ENCOUNTER — Telehealth: Payer: Self-pay | Admitting: *Deleted

## 2017-05-17 ENCOUNTER — Encounter: Payer: Self-pay | Admitting: Internal Medicine

## 2017-05-17 ENCOUNTER — Ambulatory Visit (INDEPENDENT_AMBULATORY_CARE_PROVIDER_SITE_OTHER): Payer: Medicare Other | Admitting: Internal Medicine

## 2017-05-17 VITALS — BP 120/70 | HR 74 | Temp 97.9°F | Ht <= 58 in | Wt 189.0 lb

## 2017-05-17 DIAGNOSIS — K7030 Alcoholic cirrhosis of liver without ascites: Secondary | ICD-10-CM

## 2017-05-17 DIAGNOSIS — M7501 Adhesive capsulitis of right shoulder: Secondary | ICD-10-CM

## 2017-05-17 DIAGNOSIS — E538 Deficiency of other specified B group vitamins: Secondary | ICD-10-CM

## 2017-05-17 DIAGNOSIS — I8510 Secondary esophageal varices without bleeding: Secondary | ICD-10-CM

## 2017-05-17 DIAGNOSIS — E890 Postprocedural hypothyroidism: Secondary | ICD-10-CM

## 2017-05-17 DIAGNOSIS — F320 Major depressive disorder, single episode, mild: Secondary | ICD-10-CM | POA: Diagnosis not present

## 2017-05-17 DIAGNOSIS — F5104 Psychophysiologic insomnia: Secondary | ICD-10-CM | POA: Diagnosis not present

## 2017-05-17 DIAGNOSIS — E785 Hyperlipidemia, unspecified: Secondary | ICD-10-CM

## 2017-05-17 DIAGNOSIS — C880 Waldenstrom macroglobulinemia: Secondary | ICD-10-CM | POA: Diagnosis not present

## 2017-05-17 DIAGNOSIS — L219 Seborrheic dermatitis, unspecified: Secondary | ICD-10-CM | POA: Diagnosis not present

## 2017-05-17 DIAGNOSIS — C50912 Malignant neoplasm of unspecified site of left female breast: Secondary | ICD-10-CM

## 2017-05-17 DIAGNOSIS — Z170 Estrogen receptor positive status [ER+]: Secondary | ICD-10-CM

## 2017-05-17 DIAGNOSIS — S32040A Wedge compression fracture of fourth lumbar vertebra, initial encounter for closed fracture: Secondary | ICD-10-CM

## 2017-05-17 DIAGNOSIS — K7031 Alcoholic cirrhosis of liver with ascites: Secondary | ICD-10-CM

## 2017-05-17 LAB — TSH: TSH: 4.16 mIU/L

## 2017-05-17 MED ORDER — BUPROPION HCL ER (XL) 150 MG PO TB24: 150.0000 mg | ORAL_TABLET | Freq: Every day | ORAL | 3 refills | Status: DC

## 2017-05-17 MED ORDER — PROPRANOLOL HCL 20 MG PO TABS: 20.0000 mg | ORAL_TABLET | ORAL | 3 refills | Status: DC

## 2017-05-17 NOTE — Telephone Encounter (Signed)
Sharyon Cable, daughter called and stated that patient had an appointment this morning with you and you were going to order PT for her.  Daughter has a preference of Home care agency and wants [facility]. I did not see anything in the note or an order, so when the order is placed please request [facility].

## 2017-05-17 NOTE — Progress Notes (Signed)
Provider:  Rexene Edison. Mariea Clonts, D.O., C.M.D. Location:   [facility]  Place of Service:   clinic  Previous PCP: System, Provider Not In Patient Care Team: System, Provider Not In as PCP - General Armbruster, Renelda Loma, MD as Consulting Physician (Gastroenterology)  Extended Emergency Contact Information Primary Emergency Contact: Packman,Jason Address: [address]          [place], [place] [address] [name] of [place] Phone: [phone] Relation: Son Secondary Emergency Contact: Chittenden,Katherine Address: [address]          [place], [place] [address] [name] of [place] Phone: [phone] Mobile Phone: [phone] Relation: Spouse  Code Status: DNR, MOST forms done today and will be scanned Goals of Care: Advanced Directive information Advanced Directives 05/17/2017  Does Patient Have a Medical Advance Directive? Yes  Type of Paramedic of [place];Living will  Copy of [place] in Chart? Yes   Chief Complaint  Patient presents with  . Establish Care    new patient    HPI: Patient is a 71 y.o. female seen today to establish with [facility].  Records have been received from [place], [place] and reviewed.  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  She is moving here where her children live, Sharyon Cable and Hadessah Grennan.      She saw the GI physician and had an ultrasound Wednesday there.    Right arm loss of flexion.  She is righthanded.  Had an xray that showed she had a rotator cuff tear.  It is thought it was from using her arm more when she had her total knee replacement.  Some days she takes oxycontin 1 per day and never takes if driving.  She is getting chauffeured here.  Has not had therapy with the shoulder.   Using a bengay type rub.  She has interest in a rotator cuff surgery if an option.    Uses the muscle relaxer for her hip and back.  When walks with a cane, she has more pain.  Has a waddling gain like a duck.  Does not notice a benefit of the flexeril.    Insomnia:  On temazepam since knee surgery September of last year or just a little before.  Actually got it after the bone marrow biopsy.  Hasn't slept well to begin with--brain does not shut off.    Breast cancer left breast--lumpectomy and radiation therapy.  Due for another mammogram.  Only took anastrazole short term.  She had a partial thyroidectomy (had thyroid nodule which was benign) in between.    Waldenstrom's:  Had chemo for just a month.  Then was no longer on it after that. Things have been fairly stable.  No active treatments for at least a year.  Says she'd like to forego any further bone marrow biopsies.  MGUS was being monitored, too.    Some back pain also along with her gait issues, balance.  Had a recent haircut--getting tar shampoo and selsun blue.  On citalopram happy pill.  Two caused laissez faire issue.  She's not real motivated to get up and move, but Corene Cornea doesn't really think she is depressed.  Discussed wellbutrin option.  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  We discussed advance care planning--they requested her DNR and MOST be completed:  DNR, limited interventions, abx  to be determined, IVFs to be determined and even tube feeding to be determined, but she also said that she would not want the tube feeding if she's unable to participate in the decision to get it at that time.  She only wants to keep intervening if her mentation is good.  These plans have also been discussed with her daughter and husband (husband has dementia though).  Past Medical History:  Diagnosis Date  . Abnormal EKG   . Abnormal finding on thyroid function test   . Abnormal liver function test   . Acute edema   . Alcoholic  cirrhosis ([place]) 07/02/2016   Possible NASH overlap (MELD 13)  . Anemia   . Anxiety   . Arthritis   . Ascites   . Back pain   . Breast mass   . Chronic low back pain   . Complete right rotator cuff tear   . Compression fracture of L4 lumbar vertebra (HCC)   . Dermatitis, eczematoid   . Difficulty breathing   . Gallstones   . Hepatitis   . History of adenocarcinoma of breast   . Hypercholesterolemia   . Hyperkalemia   . Hypertension   . Hypokalemia   . Hypothyroidism   . Insomnia   . Knee pain   . Leukocytosis   . Lymphadenopathy   . Macrocytosis   . Memory loss or impairment   . Menopause   . MGUS (monoclonal gammopathy of unknown significance)   . Osteoarthritis   . Osteoarthritis of right knee   . Other cirrhosis of liver ([place])   . Renal insufficiency syndrome   . Right knee DJD   . Solitary thyroid nodule   . Unspecified lump in the left breast, unspecified quadrant   . Vitamin B 12 deficiency   . 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[place])    Past Surgical History:  Procedure Laterality Date  . BREAST SURGERY  2014   Removed lymphnodes  . LESION REMOVAL  09/2015   tubular adenoma-4 subcentimeter lesions  . TOTAL KNEE ARTHROPLASTY Right 09/05/2016    Social History   Social History  . Marital status: Married    Spouse name: N/A  . Number of children: 6  . Years of education: N/A   Occupational History  . retired    Social History Main Topics  . Smoking status: Never Smoker  . Smokeless tobacco: Never Used  . Alcohol use No     Comment: 6 or more per day in the past.   . Drug use: No  . Sexual activity: Not Currently   Other Topics Concern  . Not on file   Social History Narrative   Social History       Diet? Regular      Do you drink/eat things with caffeine? 1 cup      Marital status?                        married            What year were you married?      Do you live in a house, apartment, assisted living, condo, trailer, etc.? townhouse       Is it one or more stories? no      How many persons live in your home? two      Do you have any pets in your home? (please list) no      Highest level of education completed? High school  Current or past profession: realtor      Do you exercise?      no                                Type & how often?      Advanced Directives      Do you have a living will? yes      Do you have a DNR form?      yes                            If not, do you want to discuss one?      Do you have signed POA/HPOA for forms? yes      Functional Status      Do you have difficulty bathing or dressing yourself? no      Do you have difficulty preparing food or eating? no      Do you have difficulty managing your medications? no      Do you have difficulty managing your finances? no      Do you have difficulty affording your medications? no       reports that she has never smoked. She has never used smokeless tobacco. She reports that she does not drink alcohol or use drugs.  Functional Status Survey:  was independent before move, but now children are stepping in to help with meds and appts  Family History  Problem Relation Age of Onset  . Colon cancer Neg Hx   . Stomach cancer Neg Hx   . Rectal cancer Neg Hx   . Liver cancer Neg Hx   . Esophageal cancer Neg Hx     Health Maintenance  Topic Date Due  . Hepatitis C Screening  [birth year]  . COLONOSCOPY  12/18/1994  . DEXA SCAN  12/18/2009  . PNA vac Low Risk Adult (2 of 2 - PCV13) 06/25/2014  . INFLUENZA VACCINE  07/24/2017  . MAMMOGRAM  04/30/2018  . TETANUS/TDAP  09/01/2025    No Known Allergies  Allergies as of 05/17/2017   No Known Allergies     Medication List       Accurate as of 05/17/17  9:05 AM. Always use your most recent med list.          citalopram 10 MG tablet Commonly known as:  CELEXA Take 10 mg by mouth daily.   cyclobenzaprine 10 MG tablet Commonly known as:  FLEXERIL Take 10 mg by mouth at  bedtime. prn   furosemide 40 MG tablet Commonly known as:  LASIX Take 40 mg by mouth every morning.   LACTULOSE PO Take 15 mLs by mouth every morning.   levothyroxine 75 MCG tablet Commonly known as:  SYNTHROID, LEVOTHROID Take 75 mcg by mouth daily before breakfast.   multivitamin tablet Take 1 tablet by mouth daily.   oxyCODONE 5 MG immediate release tablet Commonly known as:  Oxy IR/ROXICODONE Take 5 mg by mouth as needed for severe pain.   potassium citrate 10 MEQ (1080 MG) SR tablet Commonly known as:  UROCIT-K Take 10 mEq by mouth every morning.   propranolol 20 MG tablet Commonly known as:  INDERAL Take 20 mg by mouth every morning.   spironolactone 100 MG tablet Commonly known as:  ALDACTONE Take 100 mg by mouth every morning.   temazepam 30 MG capsule Commonly known as:  RESTORIL  Take 30 mg by mouth at bedtime as needed for sleep.   vitamin B-12 1000 MCG tablet Commonly known as:  CYANOCOBALAMIN Take 1,000 mcg by mouth every morning.   Vitamin D3 2000 units Tabs Take 1 tablet by mouth every morning.       Review of Systems  Constitutional: Positive for malaise/fatigue and weight loss. Negative for chills and fever.       Loss of appetite  HENT: Positive for congestion. Negative for hearing loss.   Eyes:       Has glasses  Respiratory: Positive for shortness of breath. Negative for cough, sputum production and wheezing.   Cardiovascular: Negative for chest pain, palpitations and leg swelling.  Gastrointestinal: Negative for abdominal pain, blood in stool, constipation, diarrhea, heartburn, melena, nausea and vomiting.       Some dysphagia  Genitourinary: Negative for dysuria.  Musculoskeletal: Positive for joint pain and myalgias.       Osteoporosis  Skin: Positive for itching and rash.       scalp  Neurological: Positive for tingling and weakness. Negative for dizziness and loss of consciousness.       Hands  Endo/Heme/Allergies: Bruises/bleeds  easily.       H/o breast cancer, waldenstrom's and mgus  Psychiatric/Behavioral: Positive for depression. Negative for hallucinations and memory loss. The patient has insomnia. The patient is not nervous/anxious.     Vitals:   05/17/17 0856  Weight: 189 lb (85.7 kg)  Height: 4' 10"  (1.473 m)   Body mass index is 39.5 kg/m. Physical Exam  Constitutional: She is oriented to person, place, and time. She appears well-developed and well-nourished. No distress.  obese  HENT:  Head: Normocephalic and atraumatic.  Right Ear: External ear normal.  Left Ear: External ear normal.  Nose: Nose normal.  Mouth/Throat: Oropharynx is clear and moist. No oropharyngeal exudate.  Eyes: Conjunctivae and EOM are normal. Pupils are equal, round, and reactive to light.  Neck: Normal range of motion. Neck supple. No JVD present.  Cardiovascular: Normal rate, regular rhythm, normal heart sounds and intact distal pulses.   Pulmonary/Chest: Effort normal and breath sounds normal. No respiratory distress.  Abdominal: Soft. Bowel sounds are normal. She exhibits no distension and no mass. There is no tenderness. There is no rebound and no guarding. No hernia.  Some ascites, but not tense, nontender  Musculoskeletal: Normal range of motion. She exhibits no edema or tenderness.  Lymphadenopathy:    She has no cervical adenopathy.  Neurological: She is alert and oriented to person, place, and time. She displays normal reflexes. No cranial nerve deficit. Coordination normal.  Skin: Skin is warm and dry. Capillary refill takes less than 2 seconds.  Few petechiae  Psychiatric: She has a normal mood and affect. Her behavior is normal. Judgment and thought content normal.    Labs reviewed: Basic Metabolic Panel:  Recent Labs  12/07/16 05/06/17 1604  NA 134* 135  K 5.0 4.7  CL  --  99  CO2  --  29  GLUCOSE  --  91  BUN 18 26*  CREATININE 0.7 0.93  CALCIUM  --  9.8   Liver Function Tests:  Recent Labs   12/07/16 05/06/17 1604  AST 33 30  ALT 51* 26  ALKPHOS 103 90  BILITOT  --  0.7  PROT  --  8.1  ALBUMIN  --  3.8   No results for input(s): LIPASE, AMYLASE in the last 8760 hours. No results for input(s): AMMONIA in  the last 8760 hours. CBC:  Recent Labs  07/10/16 12/07/16 05/06/17 1604  WBC 7.3 15.4 10.2  NEUTROABS  --   --  5.9  HGB 13.0 14.7 13.3  HCT 38 44 38.9  MCV  --   --  90.1  PLT 162 276 258.0   Cardiac Enzymes: No results for input(s): CKTOTAL, CKMB, CKMBINDEX, TROPONINI in the last 8760 hours. BNP: Invalid input(s): POCBNP No results found for: HGBA1C Lab Results  Component Value Date   TSH 1.50 12/07/2016   No results found for: VITAMINB12 No results found for: FOLATE No results found for: IRON, TIBC, FERRITIN  Imaging and Procedures noted on new patient packet: Reviewed and abstracted from records  Assessment/Plan 1. Waldenstrom's macroglobulinemia ([place]) - will check labs, has been stable recently, she and her son in law would like to avoid hematology referral if possible -pt does not want anymore bone marrows done - Protein electrophoresis, serum - Beta 2 microglobulin, serum - Protein Electrophoresis,Random Urn  2. Esophageal varices in alcoholic cirrhosis (HCC) - cont propranolol, no bleeding issues and no longer drinking - propranolol (INDERAL) 20 MG tablet; Take 1 tablet (20 mg total) by mouth every morning.  Dispense: 90 tablet; Refill: 3  3. Hyperlipidemia, unspecified hyperlipidemia type - not on meds, check for a one time value--not historically very high - Lipid panel  4. Postoperative hypothyroidism - had an adjustment in medication at one of her last appts so will check TSH  5. B12 deficiency -takes oral b12, f/u lab: - Vitamin B12  6. Malignant neoplasm of left breast in female, estrogen receptor positive, unspecified site of breast ([place]) - due for mammo, ordered - MM Digital Diagnostic Bilat; Future  7. Alcoholic cirrhosis  of liver with ascites ([place]) - stopped drinking, felt to be from mix of nash and alcohol - has had 3-4 paracenteses last with 5L removal, already seeing GI as this was most active issue -taking meds as directed  8. Depression, major, single episode, mild ([place]) - will add wellbutrin to see if it gives her more get up and go--if she does well,might taper off celexa -wellbutrin is hepatic metabolized so will monitor carefully  9. Seborrheic dermatitis of scalp - cont with selsun blue and tar shampoo--big problem is pt cannot reach the area herself with her frozen shoulder so her daughter has been helping  10. Psychophysiological insomnia -ongoing, may improve if depression treated, but can also come from elevated ammonia from cirrhosis (no signs of asterixis or cognitive effects today though)  11. Closed compression fracture of fourth lumbar vertebra, initial encounter ([place]) - has some chronic pain from this also, will need to address osteoporosis at future visit  12. Adhesive capsulitis of right shoulder - not a good surgical candidate with her liver and hematologic disease, but this is her biggest complaint so recommendation is therapy for it, tylenol, topicals, heat for pain - Ambulatory referral to [place] for PT, OT due to this and her poor balance and unsteady gait - AMB referral to orthopedics  Labs/tests ordered:  Orders Placed This Encounter  Procedures  . HM DEXA SCAN    This external order was created through the Results Console.  Marland Kitchen MM Digital Diagnostic Bilat    Standing Status:   Future    Standing Expiration Date:   07/17/2018    Order Specific Question:   Reason for Exam (SYMPTOM  OR DIAGNOSIS REQUIRED)    Answer:   h/o left breast cancer s/p lumpectomy and  XRT    Order Specific Question:   Preferred imaging location?    Answer:   [facility]  . Protein electrophoresis, serum  . Beta 2 microglobulin, serum  . Lipid panel  . TSH  . Vitamin B12  . Protein  Electrophoresis,Random Urn  . Ambulatory referral to Home Health    Referral Priority:   Routine    Referral Type:   Home Health Care    Referral Reason:   Specialty Services Required    Requested Specialty:   [place]    Number of Visits Requested:   1  . AMB referral to orthopedics    Referral Priority:   Routine    Referral Type:   Consultation    Number of Visits Requested:   1    3 mos med mgt  Fabian Coca L. Chayne Baumgart, D.O. [facility] [address] [name], [place] [address] Cell Phone (Mon-Fri 8am-5pm):  [phone] On Call:  [phone] & follow prompts after 5pm & weekends Office Phone:  [phone] Office Fax:  [phone]

## 2017-05-18 LAB — LIPID PANEL
Cholesterol: 190 mg/dL (ref <200)
HDL: 74 mg/dL (ref >50)
LDL Cholesterol: 100 mg/dL — ABNORMAL HIGH (ref <100)
Total CHOL/HDL Ratio: 2.6 Ratio (ref <5.0)
Triglycerides: 81 mg/dL (ref <150)
VLDL: 16 mg/dL (ref <30)

## 2017-05-18 LAB — VITAMIN B12: Vitamin B-12: 1000 pg/mL (ref 200–1100)

## 2017-05-21 ENCOUNTER — Other Ambulatory Visit: Payer: Self-pay

## 2017-05-21 DIAGNOSIS — K7030 Alcoholic cirrhosis of liver without ascites: Secondary | ICD-10-CM

## 2017-05-21 NOTE — Telephone Encounter (Signed)
I think you couldn't see it b/c note wasn't closed, but it is now and I sent a note to Earlie Server b/c I don't know how to change the referral after I place it.

## 2017-05-21 NOTE — Progress Notes (Signed)
done

## 2017-05-22 LAB — PROTEIN ELECTROPHORESIS, SERUM
Abnormal Protein Band1: 1.2 g/dL
Albumin ELP: 3.6 g/dL — ABNORMAL LOW (ref 3.8–4.8)
Alpha-1-Globulin: 0.3 g/dL (ref 0.2–0.3)
Alpha-2-Globulin: 0.9 g/dL (ref 0.5–0.9)
Beta 2: 0.3 g/dL (ref 0.2–0.5)
Beta Globulin: 0.4 g/dL (ref 0.4–0.6)
Gamma Globulin: 1.9 g/dL — ABNORMAL HIGH (ref 0.8–1.7)
Total Protein, Serum Electrophoresis: 7.4 g/dL (ref 6.1–8.1)

## 2017-05-23 ENCOUNTER — Encounter: Payer: Self-pay | Admitting: *Deleted

## 2017-05-23 DIAGNOSIS — C50912 Malignant neoplasm of unspecified site of left female breast: Secondary | ICD-10-CM | POA: Diagnosis not present

## 2017-05-23 DIAGNOSIS — M545 Low back pain: Secondary | ICD-10-CM | POA: Diagnosis not present

## 2017-05-23 DIAGNOSIS — M7501 Adhesive capsulitis of right shoulder: Secondary | ICD-10-CM | POA: Diagnosis not present

## 2017-05-23 DIAGNOSIS — R2689 Other abnormalities of gait and mobility: Secondary | ICD-10-CM | POA: Diagnosis not present

## 2017-05-23 DIAGNOSIS — E890 Postprocedural hypothyroidism: Secondary | ICD-10-CM | POA: Diagnosis not present

## 2017-05-23 DIAGNOSIS — F320 Major depressive disorder, single episode, mild: Secondary | ICD-10-CM | POA: Diagnosis not present

## 2017-05-23 DIAGNOSIS — Z170 Estrogen receptor positive status [ER+]: Secondary | ICD-10-CM | POA: Diagnosis not present

## 2017-05-23 DIAGNOSIS — K7030 Alcoholic cirrhosis of liver without ascites: Secondary | ICD-10-CM | POA: Diagnosis not present

## 2017-05-23 DIAGNOSIS — C880 Waldenstrom macroglobulinemia: Secondary | ICD-10-CM | POA: Diagnosis not present

## 2017-05-23 DIAGNOSIS — Z9181 History of falling: Secondary | ICD-10-CM | POA: Diagnosis not present

## 2017-05-24 LAB — PROTEIN ELECTROPHORESIS,RANDOM URN
Creatinine, Urine: 108 mg/dL (ref 20–320)
Protein Creatinine Ratio: 74 mg/g creat (ref 21–161)
Total Protein, Urine: 8 mg/dL (ref 5–24)

## 2017-05-27 DIAGNOSIS — R2689 Other abnormalities of gait and mobility: Secondary | ICD-10-CM | POA: Diagnosis not present

## 2017-05-27 DIAGNOSIS — F320 Major depressive disorder, single episode, mild: Secondary | ICD-10-CM | POA: Diagnosis not present

## 2017-05-27 DIAGNOSIS — K7030 Alcoholic cirrhosis of liver without ascites: Secondary | ICD-10-CM | POA: Diagnosis not present

## 2017-05-27 DIAGNOSIS — C880 Waldenstrom macroglobulinemia: Secondary | ICD-10-CM | POA: Diagnosis not present

## 2017-05-27 DIAGNOSIS — M7501 Adhesive capsulitis of right shoulder: Secondary | ICD-10-CM | POA: Diagnosis not present

## 2017-05-27 DIAGNOSIS — M545 Low back pain: Secondary | ICD-10-CM | POA: Diagnosis not present

## 2017-05-29 ENCOUNTER — Other Ambulatory Visit: Payer: Self-pay | Admitting: *Deleted

## 2017-05-29 DIAGNOSIS — M7501 Adhesive capsulitis of right shoulder: Secondary | ICD-10-CM | POA: Diagnosis not present

## 2017-05-29 DIAGNOSIS — K7030 Alcoholic cirrhosis of liver without ascites: Secondary | ICD-10-CM | POA: Diagnosis not present

## 2017-05-29 DIAGNOSIS — F320 Major depressive disorder, single episode, mild: Secondary | ICD-10-CM | POA: Diagnosis not present

## 2017-05-29 DIAGNOSIS — R2689 Other abnormalities of gait and mobility: Secondary | ICD-10-CM | POA: Diagnosis not present

## 2017-05-29 DIAGNOSIS — C880 Waldenstrom macroglobulinemia: Secondary | ICD-10-CM | POA: Diagnosis not present

## 2017-05-29 DIAGNOSIS — M545 Low back pain: Secondary | ICD-10-CM | POA: Diagnosis not present

## 2017-05-29 MED ORDER — ZOSTER VAC RECOMB ADJUVANTED 50 MCG/0.5ML IM SUSR: 0.5000 mL | Freq: Once | INTRAMUSCULAR | 1 refills | Status: AC

## 2017-05-29 NOTE — Telephone Encounter (Signed)
Daughter, Sharyon Cable requested to be faxed to pharmacy.

## 2017-05-30 LAB — BETA 2 MICROGLOBULIN, SERUM: Beta-2 Microglobulin: 3.09 mg/L — ABNORMAL HIGH (ref <=2.51)

## 2017-06-03 ENCOUNTER — Encounter: Payer: Self-pay | Admitting: *Deleted

## 2017-06-03 DIAGNOSIS — F320 Major depressive disorder, single episode, mild: Secondary | ICD-10-CM | POA: Diagnosis not present

## 2017-06-03 DIAGNOSIS — M545 Low back pain: Secondary | ICD-10-CM | POA: Diagnosis not present

## 2017-06-03 DIAGNOSIS — M7501 Adhesive capsulitis of right shoulder: Secondary | ICD-10-CM | POA: Diagnosis not present

## 2017-06-03 DIAGNOSIS — R2689 Other abnormalities of gait and mobility: Secondary | ICD-10-CM | POA: Diagnosis not present

## 2017-06-03 DIAGNOSIS — C880 Waldenstrom macroglobulinemia: Secondary | ICD-10-CM | POA: Diagnosis not present

## 2017-06-03 DIAGNOSIS — K7030 Alcoholic cirrhosis of liver without ascites: Secondary | ICD-10-CM | POA: Diagnosis not present

## 2017-06-04 DIAGNOSIS — F320 Major depressive disorder, single episode, mild: Secondary | ICD-10-CM | POA: Diagnosis not present

## 2017-06-04 DIAGNOSIS — M7501 Adhesive capsulitis of right shoulder: Secondary | ICD-10-CM | POA: Diagnosis not present

## 2017-06-04 DIAGNOSIS — M545 Low back pain: Secondary | ICD-10-CM | POA: Diagnosis not present

## 2017-06-04 DIAGNOSIS — K7030 Alcoholic cirrhosis of liver without ascites: Secondary | ICD-10-CM | POA: Diagnosis not present

## 2017-06-04 DIAGNOSIS — C880 Waldenstrom macroglobulinemia: Secondary | ICD-10-CM | POA: Diagnosis not present

## 2017-06-04 DIAGNOSIS — R2689 Other abnormalities of gait and mobility: Secondary | ICD-10-CM | POA: Diagnosis not present

## 2017-06-07 DIAGNOSIS — F320 Major depressive disorder, single episode, mild: Secondary | ICD-10-CM | POA: Diagnosis not present

## 2017-06-07 DIAGNOSIS — K7030 Alcoholic cirrhosis of liver without ascites: Secondary | ICD-10-CM | POA: Diagnosis not present

## 2017-06-07 DIAGNOSIS — R2689 Other abnormalities of gait and mobility: Secondary | ICD-10-CM | POA: Diagnosis not present

## 2017-06-07 DIAGNOSIS — M545 Low back pain: Secondary | ICD-10-CM | POA: Diagnosis not present

## 2017-06-07 DIAGNOSIS — C880 Waldenstrom macroglobulinemia: Secondary | ICD-10-CM | POA: Diagnosis not present

## 2017-06-07 DIAGNOSIS — M7501 Adhesive capsulitis of right shoulder: Secondary | ICD-10-CM | POA: Diagnosis not present

## 2017-06-10 DIAGNOSIS — M545 Low back pain: Secondary | ICD-10-CM | POA: Diagnosis not present

## 2017-06-10 DIAGNOSIS — M7501 Adhesive capsulitis of right shoulder: Secondary | ICD-10-CM | POA: Diagnosis not present

## 2017-06-10 DIAGNOSIS — C880 Waldenstrom macroglobulinemia: Secondary | ICD-10-CM | POA: Diagnosis not present

## 2017-06-10 DIAGNOSIS — F320 Major depressive disorder, single episode, mild: Secondary | ICD-10-CM | POA: Diagnosis not present

## 2017-06-10 DIAGNOSIS — R2689 Other abnormalities of gait and mobility: Secondary | ICD-10-CM | POA: Diagnosis not present

## 2017-06-10 DIAGNOSIS — K7030 Alcoholic cirrhosis of liver without ascites: Secondary | ICD-10-CM | POA: Diagnosis not present

## 2017-06-11 DIAGNOSIS — R2689 Other abnormalities of gait and mobility: Secondary | ICD-10-CM | POA: Diagnosis not present

## 2017-06-11 DIAGNOSIS — M545 Low back pain: Secondary | ICD-10-CM | POA: Diagnosis not present

## 2017-06-11 DIAGNOSIS — M7501 Adhesive capsulitis of right shoulder: Secondary | ICD-10-CM | POA: Diagnosis not present

## 2017-06-11 DIAGNOSIS — K7030 Alcoholic cirrhosis of liver without ascites: Secondary | ICD-10-CM | POA: Diagnosis not present

## 2017-06-11 DIAGNOSIS — F320 Major depressive disorder, single episode, mild: Secondary | ICD-10-CM | POA: Diagnosis not present

## 2017-06-11 DIAGNOSIS — C880 Waldenstrom macroglobulinemia: Secondary | ICD-10-CM | POA: Diagnosis not present

## 2017-06-12 DIAGNOSIS — R2689 Other abnormalities of gait and mobility: Secondary | ICD-10-CM | POA: Diagnosis not present

## 2017-06-12 DIAGNOSIS — M545 Low back pain: Secondary | ICD-10-CM | POA: Diagnosis not present

## 2017-06-12 DIAGNOSIS — M7501 Adhesive capsulitis of right shoulder: Secondary | ICD-10-CM | POA: Diagnosis not present

## 2017-06-12 DIAGNOSIS — F320 Major depressive disorder, single episode, mild: Secondary | ICD-10-CM | POA: Diagnosis not present

## 2017-06-12 DIAGNOSIS — K7030 Alcoholic cirrhosis of liver without ascites: Secondary | ICD-10-CM | POA: Diagnosis not present

## 2017-06-12 DIAGNOSIS — C880 Waldenstrom macroglobulinemia: Secondary | ICD-10-CM | POA: Diagnosis not present

## 2017-06-13 DIAGNOSIS — M545 Low back pain: Secondary | ICD-10-CM | POA: Diagnosis not present

## 2017-06-13 DIAGNOSIS — M7501 Adhesive capsulitis of right shoulder: Secondary | ICD-10-CM | POA: Diagnosis not present

## 2017-06-13 DIAGNOSIS — K7030 Alcoholic cirrhosis of liver without ascites: Secondary | ICD-10-CM | POA: Diagnosis not present

## 2017-06-13 DIAGNOSIS — R2689 Other abnormalities of gait and mobility: Secondary | ICD-10-CM | POA: Diagnosis not present

## 2017-06-13 DIAGNOSIS — C880 Waldenstrom macroglobulinemia: Secondary | ICD-10-CM | POA: Diagnosis not present

## 2017-06-13 DIAGNOSIS — F320 Major depressive disorder, single episode, mild: Secondary | ICD-10-CM | POA: Diagnosis not present

## 2017-06-17 DIAGNOSIS — M7501 Adhesive capsulitis of right shoulder: Secondary | ICD-10-CM | POA: Diagnosis not present

## 2017-06-17 DIAGNOSIS — M545 Low back pain: Secondary | ICD-10-CM | POA: Diagnosis not present

## 2017-06-17 DIAGNOSIS — K7030 Alcoholic cirrhosis of liver without ascites: Secondary | ICD-10-CM | POA: Diagnosis not present

## 2017-06-17 DIAGNOSIS — F320 Major depressive disorder, single episode, mild: Secondary | ICD-10-CM | POA: Diagnosis not present

## 2017-06-17 DIAGNOSIS — C880 Waldenstrom macroglobulinemia: Secondary | ICD-10-CM | POA: Diagnosis not present

## 2017-06-17 DIAGNOSIS — R2689 Other abnormalities of gait and mobility: Secondary | ICD-10-CM | POA: Diagnosis not present

## 2017-06-18 DIAGNOSIS — R2689 Other abnormalities of gait and mobility: Secondary | ICD-10-CM | POA: Diagnosis not present

## 2017-06-18 DIAGNOSIS — M545 Low back pain: Secondary | ICD-10-CM | POA: Diagnosis not present

## 2017-06-18 DIAGNOSIS — C880 Waldenstrom macroglobulinemia: Secondary | ICD-10-CM | POA: Diagnosis not present

## 2017-06-18 DIAGNOSIS — F320 Major depressive disorder, single episode, mild: Secondary | ICD-10-CM | POA: Diagnosis not present

## 2017-06-18 DIAGNOSIS — K7030 Alcoholic cirrhosis of liver without ascites: Secondary | ICD-10-CM | POA: Diagnosis not present

## 2017-06-18 DIAGNOSIS — M7501 Adhesive capsulitis of right shoulder: Secondary | ICD-10-CM | POA: Diagnosis not present

## 2017-06-19 ENCOUNTER — Telehealth: Payer: Self-pay | Admitting: *Deleted

## 2017-06-19 ENCOUNTER — Other Ambulatory Visit: Payer: Self-pay | Admitting: Internal Medicine

## 2017-06-19 DIAGNOSIS — C880 Waldenstrom macroglobulinemia: Secondary | ICD-10-CM | POA: Diagnosis not present

## 2017-06-19 DIAGNOSIS — M7501 Adhesive capsulitis of right shoulder: Secondary | ICD-10-CM | POA: Diagnosis not present

## 2017-06-19 DIAGNOSIS — R2689 Other abnormalities of gait and mobility: Secondary | ICD-10-CM | POA: Diagnosis not present

## 2017-06-19 DIAGNOSIS — K7030 Alcoholic cirrhosis of liver without ascites: Secondary | ICD-10-CM | POA: Diagnosis not present

## 2017-06-19 DIAGNOSIS — M545 Low back pain: Secondary | ICD-10-CM | POA: Diagnosis not present

## 2017-06-19 DIAGNOSIS — F320 Major depressive disorder, single episode, mild: Secondary | ICD-10-CM | POA: Diagnosis not present

## 2017-06-19 NOTE — Telephone Encounter (Signed)
Christina [name], daughter called and stated that you had told patient to finish up her sleeping pill and pain medication and they are almost finished with it. Wants to know what you want her to take now due to the fatty liver disease.  Daughter is wondering for the sleeping pill if patient could try Melatonin first before prescribing something. Please Advise.

## 2017-06-19 NOTE — Telephone Encounter (Signed)
What do you want to prescribe for the pain? Please Advise.

## 2017-06-19 NOTE — Telephone Encounter (Signed)
She is limited in choices for pain and we cannot use high doses with her liver disease.  Has she ever used tramadol?  I'd suggest that next 31m po bid prn pain.

## 2017-06-19 NOTE — Telephone Encounter (Signed)
I agree with melatonin.  She can start with 53m and take 15mat the most about 1 hr before she wants to be asleep each night.

## 2017-06-20 DIAGNOSIS — F320 Major depressive disorder, single episode, mild: Secondary | ICD-10-CM | POA: Diagnosis not present

## 2017-06-20 DIAGNOSIS — M545 Low back pain: Secondary | ICD-10-CM | POA: Diagnosis not present

## 2017-06-20 DIAGNOSIS — K7030 Alcoholic cirrhosis of liver without ascites: Secondary | ICD-10-CM | POA: Diagnosis not present

## 2017-06-20 DIAGNOSIS — R2689 Other abnormalities of gait and mobility: Secondary | ICD-10-CM | POA: Diagnosis not present

## 2017-06-20 DIAGNOSIS — C880 Waldenstrom macroglobulinemia: Secondary | ICD-10-CM | POA: Diagnosis not present

## 2017-06-20 DIAGNOSIS — M7501 Adhesive capsulitis of right shoulder: Secondary | ICD-10-CM | POA: Diagnosis not present

## 2017-06-20 MED ORDER — TRAMADOL HCL 50 MG PO TABS: ORAL_TABLET | ORAL | 1 refills | Status: DC

## 2017-06-20 MED ORDER — MELATONIN 10 MG PO TABS: ORAL_TABLET | ORAL | 0 refills | Status: DC

## 2017-06-20 NOTE — Telephone Encounter (Signed)
Patient daughter, Jackelyn Poling notified and agreed. Rx faxed to pharmacy and medication list updated.

## 2017-06-23 DIAGNOSIS — K7030 Alcoholic cirrhosis of liver without ascites: Secondary | ICD-10-CM | POA: Diagnosis not present

## 2017-06-23 DIAGNOSIS — C880 Waldenstrom macroglobulinemia: Secondary | ICD-10-CM | POA: Diagnosis not present

## 2017-06-23 DIAGNOSIS — M7501 Adhesive capsulitis of right shoulder: Secondary | ICD-10-CM | POA: Diagnosis not present

## 2017-06-23 DIAGNOSIS — R2689 Other abnormalities of gait and mobility: Secondary | ICD-10-CM | POA: Diagnosis not present

## 2017-06-23 DIAGNOSIS — F320 Major depressive disorder, single episode, mild: Secondary | ICD-10-CM | POA: Diagnosis not present

## 2017-06-23 DIAGNOSIS — M545 Low back pain: Secondary | ICD-10-CM | POA: Diagnosis not present

## 2017-06-24 DIAGNOSIS — K7030 Alcoholic cirrhosis of liver without ascites: Secondary | ICD-10-CM | POA: Diagnosis not present

## 2017-06-24 DIAGNOSIS — R2689 Other abnormalities of gait and mobility: Secondary | ICD-10-CM | POA: Diagnosis not present

## 2017-06-24 DIAGNOSIS — M545 Low back pain: Secondary | ICD-10-CM | POA: Diagnosis not present

## 2017-06-24 DIAGNOSIS — M7501 Adhesive capsulitis of right shoulder: Secondary | ICD-10-CM | POA: Diagnosis not present

## 2017-06-24 DIAGNOSIS — F320 Major depressive disorder, single episode, mild: Secondary | ICD-10-CM | POA: Diagnosis not present

## 2017-06-24 DIAGNOSIS — C880 Waldenstrom macroglobulinemia: Secondary | ICD-10-CM | POA: Diagnosis not present

## 2017-06-28 DIAGNOSIS — M7501 Adhesive capsulitis of right shoulder: Secondary | ICD-10-CM | POA: Diagnosis not present

## 2017-06-28 DIAGNOSIS — C880 Waldenstrom macroglobulinemia: Secondary | ICD-10-CM | POA: Diagnosis not present

## 2017-06-28 DIAGNOSIS — K7030 Alcoholic cirrhosis of liver without ascites: Secondary | ICD-10-CM | POA: Diagnosis not present

## 2017-06-28 DIAGNOSIS — M545 Low back pain: Secondary | ICD-10-CM | POA: Diagnosis not present

## 2017-06-28 DIAGNOSIS — F320 Major depressive disorder, single episode, mild: Secondary | ICD-10-CM | POA: Diagnosis not present

## 2017-06-28 DIAGNOSIS — R2689 Other abnormalities of gait and mobility: Secondary | ICD-10-CM | POA: Diagnosis not present

## 2017-07-02 DIAGNOSIS — F320 Major depressive disorder, single episode, mild: Secondary | ICD-10-CM | POA: Diagnosis not present

## 2017-07-02 DIAGNOSIS — M545 Low back pain: Secondary | ICD-10-CM | POA: Diagnosis not present

## 2017-07-02 DIAGNOSIS — R2689 Other abnormalities of gait and mobility: Secondary | ICD-10-CM | POA: Diagnosis not present

## 2017-07-02 DIAGNOSIS — C880 Waldenstrom macroglobulinemia: Secondary | ICD-10-CM | POA: Diagnosis not present

## 2017-07-02 DIAGNOSIS — M7501 Adhesive capsulitis of right shoulder: Secondary | ICD-10-CM | POA: Diagnosis not present

## 2017-07-02 DIAGNOSIS — K7030 Alcoholic cirrhosis of liver without ascites: Secondary | ICD-10-CM | POA: Diagnosis not present

## 2017-07-05 DIAGNOSIS — M7501 Adhesive capsulitis of right shoulder: Secondary | ICD-10-CM | POA: Diagnosis not present

## 2017-07-05 DIAGNOSIS — M545 Low back pain: Secondary | ICD-10-CM | POA: Diagnosis not present

## 2017-07-05 DIAGNOSIS — Z961 Presence of intraocular lens: Secondary | ICD-10-CM | POA: Diagnosis not present

## 2017-07-05 DIAGNOSIS — R2689 Other abnormalities of gait and mobility: Secondary | ICD-10-CM | POA: Diagnosis not present

## 2017-07-05 DIAGNOSIS — F320 Major depressive disorder, single episode, mild: Secondary | ICD-10-CM | POA: Diagnosis not present

## 2017-07-05 DIAGNOSIS — C880 Waldenstrom macroglobulinemia: Secondary | ICD-10-CM | POA: Diagnosis not present

## 2017-07-05 DIAGNOSIS — K7030 Alcoholic cirrhosis of liver without ascites: Secondary | ICD-10-CM | POA: Diagnosis not present

## 2017-07-05 DIAGNOSIS — H5203 Hypermetropia, bilateral: Secondary | ICD-10-CM | POA: Diagnosis not present

## 2017-07-09 DIAGNOSIS — R2689 Other abnormalities of gait and mobility: Secondary | ICD-10-CM | POA: Diagnosis not present

## 2017-07-09 DIAGNOSIS — M545 Low back pain: Secondary | ICD-10-CM | POA: Diagnosis not present

## 2017-07-09 DIAGNOSIS — F320 Major depressive disorder, single episode, mild: Secondary | ICD-10-CM | POA: Diagnosis not present

## 2017-07-09 DIAGNOSIS — C880 Waldenstrom macroglobulinemia: Secondary | ICD-10-CM | POA: Diagnosis not present

## 2017-07-09 DIAGNOSIS — K7030 Alcoholic cirrhosis of liver without ascites: Secondary | ICD-10-CM | POA: Diagnosis not present

## 2017-07-09 DIAGNOSIS — M7501 Adhesive capsulitis of right shoulder: Secondary | ICD-10-CM | POA: Diagnosis not present

## 2017-07-11 DIAGNOSIS — C880 Waldenstrom macroglobulinemia: Secondary | ICD-10-CM | POA: Diagnosis not present

## 2017-07-11 DIAGNOSIS — R2689 Other abnormalities of gait and mobility: Secondary | ICD-10-CM | POA: Diagnosis not present

## 2017-07-11 DIAGNOSIS — F320 Major depressive disorder, single episode, mild: Secondary | ICD-10-CM | POA: Diagnosis not present

## 2017-07-11 DIAGNOSIS — M545 Low back pain: Secondary | ICD-10-CM | POA: Diagnosis not present

## 2017-07-11 DIAGNOSIS — K7030 Alcoholic cirrhosis of liver without ascites: Secondary | ICD-10-CM | POA: Diagnosis not present

## 2017-07-11 DIAGNOSIS — M7501 Adhesive capsulitis of right shoulder: Secondary | ICD-10-CM | POA: Diagnosis not present

## 2017-07-15 DIAGNOSIS — C880 Waldenstrom macroglobulinemia: Secondary | ICD-10-CM | POA: Diagnosis not present

## 2017-07-15 DIAGNOSIS — R2689 Other abnormalities of gait and mobility: Secondary | ICD-10-CM | POA: Diagnosis not present

## 2017-07-15 DIAGNOSIS — M7501 Adhesive capsulitis of right shoulder: Secondary | ICD-10-CM | POA: Diagnosis not present

## 2017-07-15 DIAGNOSIS — M545 Low back pain: Secondary | ICD-10-CM | POA: Diagnosis not present

## 2017-07-15 DIAGNOSIS — K7030 Alcoholic cirrhosis of liver without ascites: Secondary | ICD-10-CM | POA: Diagnosis not present

## 2017-07-15 DIAGNOSIS — F320 Major depressive disorder, single episode, mild: Secondary | ICD-10-CM | POA: Diagnosis not present

## 2017-07-18 DIAGNOSIS — C880 Waldenstrom macroglobulinemia: Secondary | ICD-10-CM | POA: Diagnosis not present

## 2017-07-18 DIAGNOSIS — R2689 Other abnormalities of gait and mobility: Secondary | ICD-10-CM | POA: Diagnosis not present

## 2017-07-18 DIAGNOSIS — F320 Major depressive disorder, single episode, mild: Secondary | ICD-10-CM | POA: Diagnosis not present

## 2017-07-18 DIAGNOSIS — M545 Low back pain: Secondary | ICD-10-CM | POA: Diagnosis not present

## 2017-07-18 DIAGNOSIS — M7501 Adhesive capsulitis of right shoulder: Secondary | ICD-10-CM | POA: Diagnosis not present

## 2017-07-18 DIAGNOSIS — K7030 Alcoholic cirrhosis of liver without ascites: Secondary | ICD-10-CM | POA: Diagnosis not present

## 2017-07-22 DIAGNOSIS — M545 Low back pain: Secondary | ICD-10-CM | POA: Diagnosis not present

## 2017-07-22 DIAGNOSIS — R2689 Other abnormalities of gait and mobility: Secondary | ICD-10-CM | POA: Diagnosis not present

## 2017-07-22 DIAGNOSIS — Z9181 History of falling: Secondary | ICD-10-CM | POA: Diagnosis not present

## 2017-07-22 DIAGNOSIS — K7030 Alcoholic cirrhosis of liver without ascites: Secondary | ICD-10-CM | POA: Diagnosis not present

## 2017-07-22 DIAGNOSIS — F320 Major depressive disorder, single episode, mild: Secondary | ICD-10-CM | POA: Diagnosis not present

## 2017-07-22 DIAGNOSIS — C880 Waldenstrom macroglobulinemia: Secondary | ICD-10-CM | POA: Diagnosis not present

## 2017-07-22 DIAGNOSIS — M7501 Adhesive capsulitis of right shoulder: Secondary | ICD-10-CM | POA: Diagnosis not present

## 2017-07-22 DIAGNOSIS — Z853 Personal history of malignant neoplasm of breast: Secondary | ICD-10-CM | POA: Diagnosis not present

## 2017-07-23 DIAGNOSIS — R2689 Other abnormalities of gait and mobility: Secondary | ICD-10-CM | POA: Diagnosis not present

## 2017-07-23 DIAGNOSIS — F320 Major depressive disorder, single episode, mild: Secondary | ICD-10-CM | POA: Diagnosis not present

## 2017-07-23 DIAGNOSIS — C880 Waldenstrom macroglobulinemia: Secondary | ICD-10-CM | POA: Diagnosis not present

## 2017-07-23 DIAGNOSIS — M7501 Adhesive capsulitis of right shoulder: Secondary | ICD-10-CM | POA: Diagnosis not present

## 2017-07-23 DIAGNOSIS — K7030 Alcoholic cirrhosis of liver without ascites: Secondary | ICD-10-CM | POA: Diagnosis not present

## 2017-07-23 DIAGNOSIS — M545 Low back pain: Secondary | ICD-10-CM | POA: Diagnosis not present

## 2017-07-26 DIAGNOSIS — M545 Low back pain: Secondary | ICD-10-CM | POA: Diagnosis not present

## 2017-07-26 DIAGNOSIS — K7030 Alcoholic cirrhosis of liver without ascites: Secondary | ICD-10-CM | POA: Diagnosis not present

## 2017-07-26 DIAGNOSIS — C880 Waldenstrom macroglobulinemia: Secondary | ICD-10-CM | POA: Diagnosis not present

## 2017-07-26 DIAGNOSIS — F320 Major depressive disorder, single episode, mild: Secondary | ICD-10-CM | POA: Diagnosis not present

## 2017-07-26 DIAGNOSIS — M7501 Adhesive capsulitis of right shoulder: Secondary | ICD-10-CM | POA: Diagnosis not present

## 2017-07-26 DIAGNOSIS — R2689 Other abnormalities of gait and mobility: Secondary | ICD-10-CM | POA: Diagnosis not present

## 2017-07-30 DIAGNOSIS — M545 Low back pain: Secondary | ICD-10-CM | POA: Diagnosis not present

## 2017-07-30 DIAGNOSIS — R2689 Other abnormalities of gait and mobility: Secondary | ICD-10-CM | POA: Diagnosis not present

## 2017-07-30 DIAGNOSIS — C880 Waldenstrom macroglobulinemia: Secondary | ICD-10-CM | POA: Diagnosis not present

## 2017-07-30 DIAGNOSIS — M7501 Adhesive capsulitis of right shoulder: Secondary | ICD-10-CM | POA: Diagnosis not present

## 2017-07-30 DIAGNOSIS — K7030 Alcoholic cirrhosis of liver without ascites: Secondary | ICD-10-CM | POA: Diagnosis not present

## 2017-07-30 DIAGNOSIS — F320 Major depressive disorder, single episode, mild: Secondary | ICD-10-CM | POA: Diagnosis not present

## 2017-08-02 DIAGNOSIS — M545 Low back pain: Secondary | ICD-10-CM | POA: Diagnosis not present

## 2017-08-02 DIAGNOSIS — R2689 Other abnormalities of gait and mobility: Secondary | ICD-10-CM | POA: Diagnosis not present

## 2017-08-02 DIAGNOSIS — M7501 Adhesive capsulitis of right shoulder: Secondary | ICD-10-CM | POA: Diagnosis not present

## 2017-08-02 DIAGNOSIS — C880 Waldenstrom macroglobulinemia: Secondary | ICD-10-CM | POA: Diagnosis not present

## 2017-08-02 DIAGNOSIS — K7030 Alcoholic cirrhosis of liver without ascites: Secondary | ICD-10-CM | POA: Diagnosis not present

## 2017-08-02 DIAGNOSIS — F320 Major depressive disorder, single episode, mild: Secondary | ICD-10-CM | POA: Diagnosis not present

## 2017-08-07 DIAGNOSIS — C880 Waldenstrom macroglobulinemia: Secondary | ICD-10-CM | POA: Diagnosis not present

## 2017-08-07 DIAGNOSIS — M545 Low back pain: Secondary | ICD-10-CM | POA: Diagnosis not present

## 2017-08-07 DIAGNOSIS — R2689 Other abnormalities of gait and mobility: Secondary | ICD-10-CM | POA: Diagnosis not present

## 2017-08-07 DIAGNOSIS — F320 Major depressive disorder, single episode, mild: Secondary | ICD-10-CM | POA: Diagnosis not present

## 2017-08-07 DIAGNOSIS — K7030 Alcoholic cirrhosis of liver without ascites: Secondary | ICD-10-CM | POA: Diagnosis not present

## 2017-08-07 DIAGNOSIS — M7501 Adhesive capsulitis of right shoulder: Secondary | ICD-10-CM | POA: Diagnosis not present

## 2017-08-16 ENCOUNTER — Other Ambulatory Visit: Payer: Self-pay | Admitting: Internal Medicine

## 2017-09-09 ENCOUNTER — Ambulatory Visit (INDEPENDENT_AMBULATORY_CARE_PROVIDER_SITE_OTHER): Payer: Medicare Other | Admitting: Internal Medicine

## 2017-09-09 ENCOUNTER — Encounter: Payer: Self-pay | Admitting: Internal Medicine

## 2017-09-09 VITALS — BP 120/70 | HR 72 | Temp 97.8°F | Ht <= 58 in | Wt 178.0 lb

## 2017-09-09 DIAGNOSIS — F5104 Psychophysiologic insomnia: Secondary | ICD-10-CM | POA: Diagnosis not present

## 2017-09-09 DIAGNOSIS — M25511 Pain in right shoulder: Secondary | ICD-10-CM

## 2017-09-09 DIAGNOSIS — Z853 Personal history of malignant neoplasm of breast: Secondary | ICD-10-CM | POA: Diagnosis not present

## 2017-09-09 DIAGNOSIS — I8510 Secondary esophageal varices without bleeding: Secondary | ICD-10-CM | POA: Insufficient documentation

## 2017-09-09 DIAGNOSIS — G8929 Other chronic pain: Secondary | ICD-10-CM

## 2017-09-09 DIAGNOSIS — S32040A Wedge compression fracture of fourth lumbar vertebra, initial encounter for closed fracture: Secondary | ICD-10-CM

## 2017-09-09 DIAGNOSIS — K7031 Alcoholic cirrhosis of liver with ascites: Secondary | ICD-10-CM | POA: Insufficient documentation

## 2017-09-09 DIAGNOSIS — C880 Waldenstrom macroglobulinemia: Secondary | ICD-10-CM | POA: Diagnosis not present

## 2017-09-09 DIAGNOSIS — L219 Seborrheic dermatitis, unspecified: Secondary | ICD-10-CM | POA: Diagnosis not present

## 2017-09-09 DIAGNOSIS — E538 Deficiency of other specified B group vitamins: Secondary | ICD-10-CM | POA: Diagnosis not present

## 2017-09-09 DIAGNOSIS — E785 Hyperlipidemia, unspecified: Secondary | ICD-10-CM

## 2017-09-09 DIAGNOSIS — E890 Postprocedural hypothyroidism: Secondary | ICD-10-CM | POA: Diagnosis not present

## 2017-09-09 DIAGNOSIS — Z23 Encounter for immunization: Secondary | ICD-10-CM | POA: Diagnosis not present

## 2017-09-09 DIAGNOSIS — K7030 Alcoholic cirrhosis of liver without ascites: Secondary | ICD-10-CM

## 2017-09-09 DIAGNOSIS — F320 Major depressive disorder, single episode, mild: Secondary | ICD-10-CM | POA: Diagnosis not present

## 2017-09-09 LAB — CBC WITH DIFFERENTIAL/PLATELET
Basophils Absolute: 48 cells/uL (ref 0–200)
Basophils Relative: 0.5 %
Eosinophils Absolute: 114 cells/uL (ref 15–500)
Eosinophils Relative: 1.2 %
HCT: 39.4 % (ref 35.0–45.0)
Hemoglobin: 13.4 g/dL (ref 11.7–15.5)
Lymphs Abs: 2632 cells/uL (ref 850–3900)
MCH: 30 pg (ref 27.0–33.0)
MCHC: 34 g/dL (ref 32.0–36.0)
MCV: 88.3 fL (ref 80.0–100.0)
MPV: 10.4 fL (ref 7.5–12.5)
Monocytes Relative: 14.3 %
Neutro Abs: 5349 cells/uL (ref 1500–7800)
Neutrophils Relative %: 56.3 %
Platelets: 264 10*3/uL (ref 140–400)
RBC: 4.46 10*6/uL (ref 3.80–5.10)
RDW: 12.8 % (ref 11.0–15.0)
Total Lymphocyte: 27.7 %
WBC mixed population: 1359 cells/uL — ABNORMAL HIGH (ref 200–950)
WBC: 9.5 10*3/uL (ref 3.8–10.8)

## 2017-09-09 LAB — BASIC METABOLIC PANEL
BUN/Creatinine Ratio: 18 (calc) (ref 6–22)
BUN: 20 mg/dL (ref 7–25)
CO2: 30 mmol/L (ref 20–32)
Calcium: 9.5 mg/dL (ref 8.6–10.4)
Chloride: 99 mmol/L (ref 98–110)
Creat: 1.09 mg/dL — ABNORMAL HIGH (ref 0.60–0.93)
Glucose, Bld: 91 mg/dL (ref 65–139)
Potassium: 4.2 mmol/L (ref 3.5–5.3)
Sodium: 136 mmol/L (ref 135–146)

## 2017-09-09 LAB — HEPATIC FUNCTION PANEL
AG Ratio: 1.1 (calc) (ref 1.0–2.5)
ALT: 17 U/L (ref 6–29)
AST: 22 U/L (ref 10–35)
Albumin: 3.7 g/dL (ref 3.6–5.1)
Alkaline phosphatase (APISO): 81 U/L (ref 33–130)
Bilirubin, Direct: 0.2 mg/dL (ref 0.0–0.2)
Globulin: 3.5 g/dL (calc) (ref 1.9–3.7)
Indirect Bilirubin: 0.3 mg/dL (calc) (ref 0.2–1.2)
Total Bilirubin: 0.5 mg/dL (ref 0.2–1.2)
Total Protein: 7.2 g/dL (ref 6.1–8.1)

## 2017-09-09 LAB — TSH: TSH: 5.93 mIU/L — ABNORMAL HIGH (ref 0.40–4.50)

## 2017-09-09 NOTE — Progress Notes (Signed)
Location:  [facility] clinic Provider:  Solana Coggin L. Mariea Clonts, D.O., C.M.D.  Code Status: DNR Goals of Care:  Advanced Directives 09/09/2017  Does Patient Have a Medical Advance Directive? Yes  Type of Advance Directive Out of facility DNR (pink MOST or yellow form)  Copy of [place] in Chart? -  Pre-existing out of facility DNR order (yellow form or pink MOST form) Yellow form placed in chart (order not valid for inpatient use);Pink MOST form placed in chart (order not valid for inpatient use)   Chief Complaint  Patient presents with  . Medical Management of Chronic Issues    52mh follow-up    HPI: Patient is a 71y.o. female seen today for medical management of chronic diseases.    Has lost a little bit of weight.  Only thing still bothers her is her shoulder.  Uses tramadol for pain.  Hurts when she rolls on it at night.    1.5oz lactulose three times a day was "too much".  She was taking just one dose daily.  Is taking 1oz once a day.  Still having a bm at least 3x per day.  She is requiring a lot of reminders and sometimes is forgetful.  DJackelyn Polingkeeps her on track.    She's unable to tell if she has more energy.  She is getting up and doing more.  JCorene Corneasays she's doing more, but it's not clear if she's forced, but pt is in a better state.  She has good and bad days as far as handling her husband's illness.    Past Medical History:  Diagnosis Date  . Abnormal EKG   . Abnormal finding on thyroid function test   . Abnormal liver function test   . Acute edema   . Alcoholic cirrhosis ([place] 096/43/8381  Possible NASH overlap (MELD 13)  . Anemia   . Anxiety   . Arthritis   . Ascites   . Back pain   . Breast mass   . Chronic low back pain   . Complete right rotator cuff tear   . Compression fracture of L4 lumbar vertebra (HCC)   . Dermatitis, eczematoid   . Difficulty breathing   . Gallstones   . Hepatitis   . History of adenocarcinoma of breast   .  Hypercholesterolemia   . Hyperkalemia   . Hypertension   . Hypokalemia   . Hypothyroidism   . Insomnia   . Knee pain   . Leukocytosis   . Lymphadenopathy   . Macrocytosis   . Memory loss or impairment   . Menopause   . MGUS (monoclonal gammopathy of unknown significance)   . Osteoarthritis   . Osteoarthritis of right knee   . Other cirrhosis of liver ([place]   . Renal insufficiency syndrome   . Right knee DJD   . Solitary thyroid nodule   . Unspecified lump in the left breast, unspecified quadrant   . Vitamin B 12 deficiency   . 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[place]     Past Surgical History:  Procedure Laterality Date  . BREAST SURGERY  2014   Removed lymphnodes  . LESION REMOVAL  09/2015   tubular adenoma-4 subcentimeter lesions  . TOTAL KNEE ARTHROPLASTY Right 09/05/2016    No Known Allergies  Outpatient Encounter Prescriptions as of 09/09/2017  Medication Sig  . buPROPion (WELLBUTRIN XL) 150 MG 24 hr tablet TAKE ONE TABLET BY MOUTH DAILY  . Cholecalciferol (VITAMIN D3) 2000 units TABS Take 1  tablet by mouth every morning.  . citalopram (CELEXA) 10 MG tablet Take 10 mg by mouth daily.  . furosemide (LASIX) 40 MG tablet Take 40 mg by mouth every morning.  Marland Kitchen LACTULOSE PO Take 15 mLs by mouth every morning.  Marland Kitchen levothyroxine (SYNTHROID, LEVOTHROID) 75 MCG tablet Take 75 mcg by mouth daily before breakfast.  . Melatonin 10 MG TABS Take one tablet by mouth once daily at bedtime for rest  . Multiple Vitamin (MULTIVITAMIN) tablet Take 1 tablet by mouth daily.  . potassium citrate (UROCIT-K) 10 MEQ (1080 MG) SR tablet Take 10 mEq by mouth every morning.  . propranolol (INDERAL) 20 MG tablet Take 1 tablet (20 mg total) by mouth every morning.  Marland Kitchen spironolactone (ALDACTONE) 100 MG tablet Take 100 mg by mouth every morning.  . traMADol (ULTRAM) 50 MG tablet Take one tablet by mouth twice daily as needed for pain  . vitamin B-12 (CYANOCOBALAMIN) 1000 MCG tablet Take 1,000 mcg by  mouth every morning.   No facility-administered encounter medications on file as of 09/09/2017.     Review of Systems:  Review of Systems  Constitutional: Positive for malaise/fatigue. Negative for chills and fever.  HENT: Negative for congestion.   Eyes: Negative for blurred vision.  Respiratory: Negative for shortness of breath.   Cardiovascular: Negative for chest pain, palpitations and leg swelling.  Gastrointestinal: Positive for diarrhea. Negative for abdominal pain, blood in stool, constipation and melena.  Genitourinary: Negative for dysuria.  Musculoskeletal: Positive for back pain and joint pain. Negative for falls.       Right shoulder pain  Skin: Negative for itching and rash.  Neurological: Negative for dizziness, loss of consciousness and weakness.  Psychiatric/Behavioral: Positive for depression and memory loss. The patient is nervous/anxious.     Health Maintenance  Topic Date Due  . Hepatitis C Screening  Jan 25, 1946  . PNA vac Low Risk Adult (2 of 2 - PCV13) 06/25/2014  . INFLUENZA VACCINE  07/24/2017  . MAMMOGRAM  04/30/2018  . TETANUS/TDAP  09/01/2025  . COLONOSCOPY  10/19/2025  . DEXA SCAN  Completed    Physical Exam: Vitals:   09/09/17 1442  BP: 120/70  Pulse: 72  Temp: 97.8 F (36.6 C)  TempSrc: Oral  SpO2: 98%  Weight: 178 lb (80.7 kg)  Height: 4' 10"  (1.473 m)   Body mass index is 37.2 kg/m. Physical Exam  Constitutional: She is oriented to person, place, and time. She appears well-developed. No distress.  HENT:  Head: Normocephalic and atraumatic.  Eyes: Pupils are equal, round, and reactive to light. EOM are normal.  Neck: Neck supple. No JVD present.  Cardiovascular: Normal rate, regular rhythm, normal heart sounds and intact distal pulses.   Pulmonary/Chest: Effort normal and breath sounds normal. No respiratory distress. She exhibits tenderness. She exhibits no mass, no laceration, no deformity and no retraction. Right breast exhibits  tenderness. Right breast exhibits no inverted nipple, no mass, no nipple discharge and no skin change. Left breast exhibits no inverted nipple, no mass, no nipple discharge, no skin change and no tenderness.  Scarring on left from lumpectomy, XRT for breast cancer; tenderness is on right upper chest wall toward axilla and clavicle, but no palpable masses on the right side  Abdominal: Soft. Bowel sounds are normal. She exhibits no distension. There is no tenderness.  No ascites  Musculoskeletal: She exhibits tenderness.  Decreased ROM and pain of right shoulder  Lymphadenopathy:    She has no cervical adenopathy.  Neurological:  She is alert and oriented to person, place, and time.  Skin: Capillary refill takes less than 2 seconds. There is pallor.  Psychiatric:  Tearful when talking about her husband    Labs reviewed: Basic Metabolic Panel:  Recent Labs  12/07/16 05/06/17 1604 05/17/17 1033  NA 134* 135  --   K 5.0 4.7  --   CL  --  99  --   CO2  --  29  --   GLUCOSE  --  91  --   BUN 18 26*  --   CREATININE 0.7 0.93  --   CALCIUM  --  9.8  --   TSH 1.50  --  4.16   Liver Function Tests:  Recent Labs  12/07/16 05/06/17 1604  AST 33 30  ALT 51* 26  ALKPHOS 103 90  BILITOT  --  0.7  PROT  --  8.1  ALBUMIN  --  3.8   No results for input(s): LIPASE, AMYLASE in the last 8760 hours. No results for input(s): AMMONIA in the last 8760 hours. CBC:  Recent Labs  12/07/16 05/06/17 1604  WBC 15.4 10.2  NEUTROABS  --  5.9  HGB 14.7 13.3  HCT 44 38.9  MCV  --  90.1  PLT 276 258.0   Lipid Panel:  Recent Labs  05/17/17 1033  CHOL 190  HDL 74  LDLCALC 100*  TRIG 81  CHOLHDL 2.6   Assessment/Plan 1. Waldenstrom's macroglobulinemia ([place]) - f/u labs for stability - CBC with Differential/Platelet  2. Esophageal varices in alcoholic cirrhosis ([place]) -noted, continue propranolol and aldactone for portal htn mgt  3. Hyperlipidemia, unspecified hyperlipidemia  type -not currently on medication for this, monitor -last LDL was 100  4. Postoperative hypothyroidism -cont levothyroxine 46mg daily and f/u tsh - TSH  5. B12 deficiency -cont B12 supplementation daily and monitor  6. History of breast cancer -now with right upper chest tenderness so will obtain diagnostic mammogram b/l (prior left breast ca s/p lumpectomy and XRT) - MM Digital Diagnostic Bilat; Future  7. Alcoholic cirrhosis of liver with ascites ([place] - f/u labs to determine dose of lactulose--pt says she's taking 1oz daily and still having 3 loose bms per day (there's question about her adherence) - Basic metabolic panel - Hepatic function panel - Ammonia  8. Depression, major, single episode, mild (HCC) -cont celexa 163mpo daily, but add wellbutrin therapy to hopefully help with motivation and energy as she is dealing with her husband's severe illness and progressive dementia  9. Seborrheic dermatitis of scalp -cont with tgel and tar shampoo, but must use faithfully and apparently she is not per family  10. Psychophysiological insomnia -due to depression, but sometimes can be due to some degree of encephalopathy, so will check ammonia level -cont melatonin therapy  11. Closed compression fracture of fourth lumbar vertebra, [place] encounter ([place]-with chronic low back pain, using tramadol when severe -also can use heat or topicals for pain like salonpas  12. Chronic right shoulder pain - historically, she had a similar pain in her upper pectoralis area when she  - MM Digital Diagnostic Bilat; Future - Ambulatory referral to Orthopedic Surgery  13. Need for influenza vaccination - Flu vaccine HIGH DOSE PF (Fluzone High dose) given  Labs/tests ordered:   Orders Placed This Encounter  Procedures  . MM Digital Diagnostic Bilat    Standing Status:   Future    Standing Expiration Date:   11/09/2018    Order Specific Question:  Reason for Exam (SYMPTOM  OR DIAGNOSIS  REQUIRED)    Answer:   right breast tenderness, h/o left breast cancer    Order Specific Question:   Preferred imaging location?    Answer:   [facility]  . Flu vaccine HIGH DOSE PF (Fluzone High dose)  . CBC with Differential/Platelet  . TSH  . Basic metabolic panel    Order Specific Question:   Has the patient fasted?    Answer:   Yes  . Hepatic function panel  . Ammonia  . Ambulatory referral to Orthopedic Surgery    Referral Priority:   Routine    Referral Type:   Surgical    Referral Reason:   Specialty Services Required    Requested Specialty:   Orthopedic Surgery    Number of Visits Requested:   1   Next appt: 12/30/2017 med mgt and sooner prn  Khia Dieterich L. Ansh Fauble, D.O. [facility] [address], [place] [address] Cell Phone (Mon-Fri 8am-5pm):  [phone] On Call:  [phone] & follow prompts after 5pm & weekends Office Phone:  [phone] Office Fax:  [phone]

## 2017-09-10 LAB — AMMONIA: Ammonia: 72 umol/L (ref <=72)

## 2017-09-11 ENCOUNTER — Telehealth: Payer: Self-pay | Admitting: *Deleted

## 2017-09-11 MED ORDER — LEVOTHYROXINE SODIUM 88 MCG PO TABS: ORAL_TABLET | ORAL | 0 refills | Status: DC

## 2017-09-11 NOTE — Telephone Encounter (Signed)
Lab results given to daughter. See labs dated 09/09/2017. Per Dr. Sharol Harness Levothyroxine to 29mg. Daughter confirmed patient is taking medication as directed because they fill patient's medication container.  Medication list updated and Rx faxed to pharmacy.

## 2017-09-13 ENCOUNTER — Encounter: Payer: Self-pay | Admitting: Internal Medicine

## 2017-09-20 ENCOUNTER — Other Ambulatory Visit: Payer: Self-pay | Admitting: Internal Medicine

## 2017-09-20 DIAGNOSIS — Z853 Personal history of malignant neoplasm of breast: Secondary | ICD-10-CM

## 2017-09-20 DIAGNOSIS — N644 Mastodynia: Secondary | ICD-10-CM

## 2017-09-26 ENCOUNTER — Telehealth: Payer: Self-pay | Admitting: *Deleted

## 2017-09-26 MED ORDER — LORAZEPAM 0.5 MG PO TABS: 0.5000 mg | ORAL_TABLET | Freq: Four times a day (QID) | ORAL | 0 refills | Status: DC | PRN

## 2017-09-26 NOTE — Telephone Encounter (Signed)
I'm terribly sorry about the loss of her husband.  Ativan 0.26m po q 6 hrs prn anxiety.  Try to rest after taking it to prevent falls from the medication, but certainly this is an appropriate time to take this.

## 2017-09-26 NOTE — Telephone Encounter (Signed)
Patient called and wonders if something could be prescribed for her nerves. Stated that ever since her husband died last week she has been emotional, crying and can't sleep. Would like something to calm her. Please Advise.

## 2017-09-26 NOTE — Telephone Encounter (Signed)
Patient notified and agreed. Rx printed to be faxed

## 2017-10-16 ENCOUNTER — Ambulatory Visit (INDEPENDENT_AMBULATORY_CARE_PROVIDER_SITE_OTHER): Payer: Medicare Other | Admitting: Orthopedic Surgery

## 2017-10-16 ENCOUNTER — Encounter (INDEPENDENT_AMBULATORY_CARE_PROVIDER_SITE_OTHER): Payer: Self-pay | Admitting: Orthopedic Surgery

## 2017-10-16 ENCOUNTER — Ambulatory Visit (INDEPENDENT_AMBULATORY_CARE_PROVIDER_SITE_OTHER): Payer: Medicare Other

## 2017-10-16 DIAGNOSIS — M12811 Other specific arthropathies, not elsewhere classified, right shoulder: Secondary | ICD-10-CM

## 2017-10-16 DIAGNOSIS — M25511 Pain in right shoulder: Secondary | ICD-10-CM

## 2017-10-16 DIAGNOSIS — G8929 Other chronic pain: Secondary | ICD-10-CM

## 2017-10-16 NOTE — Progress Notes (Signed)
Office Visit Note   Patient: Christina [name]           Date of Birth: July 20, 1946           MRN: [id number] Visit Date: 10/16/2017 Requested by: Gayland Curry, DO [place], [place] [address] PCP: Gayland Curry, DO  Subjective: Chief Complaint  Patient presents with  . Right Shoulder - Pain    HPI: Christina [name] is a 71 year old patient with right shoulder pain.  Been going on for a year and is getting worse.  She reports decreased range of motion and decreased strength.  The pain will wake her from sleep at night.  She ambulates with a cane in the left hand.  She does not have diabetes or heart disease.  She does have some degree of liver failure from cirrhosis.  She lives by herself.  Her son and daughter do live here in [place].  She had an injection in [place] which did not help.  She is taking Flexeril oxycodone and Ultram for her pain.              ROS: All systems reviewed are negative as they relate to the chief complaint within the history of present illness.  Patient denies  fevers or chills.   Assessment & Plan: Visit Diagnoses:  1. Chronic right shoulder pain   2. Rotator cuff arthropathy of right shoulder     Plan: Impression is right shoulder rotator cuff arthropathy.  Plan is that we talked a lot today about operative and nonoperative management of this problem.  Operative management would be reverse shoulder replacement.  Risk and benefits are discussed.  Primary risk including infection or vessel damage incomplete pain relief as well as incomplete restoration of function.  Nonetheless I think if she wants any type of relief reverse shoulder replacement is indicated.  She will consider her options.  We would need to obtain thin cut CT scan prior to surgery because it looks like she may have some posterior glenoid wear.  That would need to be quantified preoperatively for optimal clitoris care placement.  Follow-Up Instructions: Return if symptoms worsen or  fail to improve.   Orders:  Orders Placed This Encounter  Procedures  . XR Shoulder Right   No orders of the defined types were placed in this encounter.     Procedures: No procedures performed   Clinical Data: No additional findings.  Objective: Vital Signs: There were no vitals taken for this visit.  Physical Exam:   Constitutional: Patient appears well-developed HEENT:  Head: Normocephalic Eyes:EOM are normal Neck: Normal range of motion Cardiovascular: Normal rate Pulmonary/chest: Effort normal Neurologic: Patient is alert Skin: Skin is warm Psychiatric: Patient has normal mood and affect    Ortho Exam: Orthopedic exam demonstrates full active and passive range of motion of the left shoulder but on the right-hand side she has about 50 of active forward flexion and abduction.  Cuff strength is predictably week and passive range of motion of the right arm generates crepitus and pain.  Motor sensory function to the hand is intact.  Neck range of motion is full.  Specialty Comments:  No specialty comments available.  Imaging: Xr Shoulder Right  Result Date: 10/16/2017 AP and axillary lateral and outlet views right shoulder reviewed.  Rotator cuff arthropathy is present with acetabular is a patient of the undersurface of the acromion and superior migration of the humeral head.  No fracture or dislocation is present.  Visualized lung fields clear.  Bones are osteopenic.    PMFS History: Patient Active Problem List   Diagnosis Date Noted  . Closed compression fracture of L4 lumbar vertebra ([place]) 09/09/2017  . Psychophysiological insomnia 09/09/2017  . Seborrheic dermatitis of scalp 09/09/2017  . Depression, major, single episode, mild ([place]) 09/09/2017  . Alcoholic cirrhosis of liver with ascites ([place]) 09/09/2017  . History of breast cancer 09/09/2017  . B12 deficiency 09/09/2017  . Postoperative hypothyroidism 09/09/2017  . Esophageal varices in alcoholic  cirrhosis ([place]) 09/09/2017  . Waldenstrom's macroglobulinemia ([place]) 09/09/2017  . Hyperlipidemia 09/09/2017   Past Medical History:  Diagnosis Date  . Abnormal EKG   . Abnormal finding on thyroid function test   . Abnormal liver function test   . Acute edema   . Alcoholic cirrhosis ([place]) 46/56/8127   Possible NASH overlap (MELD 13)  . Anemia   . Anxiety   . Arthritis   . Ascites   . Back pain   . Breast mass   . Chronic low back pain   . Complete right rotator cuff tear   . Compression fracture of L4 lumbar vertebra (HCC)   . Dermatitis, eczematoid   . Difficulty breathing   . Gallstones   . Hepatitis   . History of adenocarcinoma of breast   . Hypercholesterolemia   . Hyperkalemia   . Hypertension   . Hypokalemia   . Hypothyroidism   . Insomnia   . Knee pain   . Leukocytosis   . Lymphadenopathy   . Macrocytosis   . Memory loss or impairment   . Menopause   . MGUS (monoclonal gammopathy of unknown significance)   . Osteoarthritis   . Osteoarthritis of right knee   . Other cirrhosis of liver ([place])   . Renal insufficiency syndrome   . Right knee DJD   . Solitary thyroid nodule   . Unspecified lump in the left breast, unspecified quadrant   . Vitamin B 12 deficiency   . 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HCC)     Family History  Problem Relation Age of Onset  . Colon cancer Neg Hx   . Stomach cancer Neg Hx   . Rectal cancer Neg Hx   . Liver cancer Neg Hx   . Esophageal cancer Neg Hx     Past Surgical History:  Procedure Laterality Date  . BREAST SURGERY  2014   Removed lymphnodes  . LESION REMOVAL  09/2015   tubular adenoma-4 subcentimeter lesions  . TOTAL KNEE ARTHROPLASTY Right 09/05/2016   Social History   Occupational History  . retired    Social History Main Topics  . Smoking status: Never Smoker  . Smokeless tobacco: Never Used  . Alcohol use No     Comment: 6 or more per day in the past.   . Drug use: No  . Sexual activity: Not Currently

## 2017-10-18 ENCOUNTER — Other Ambulatory Visit: Payer: Self-pay | Admitting: Internal Medicine

## 2017-10-24 ENCOUNTER — Telehealth: Payer: Self-pay

## 2017-10-24 ENCOUNTER — Other Ambulatory Visit: Payer: Self-pay

## 2017-10-24 DIAGNOSIS — K7031 Alcoholic cirrhosis of liver with ascites: Secondary | ICD-10-CM

## 2017-10-24 NOTE — Telephone Encounter (Signed)
Spoke to patient's son, Corene Cornea, gave him the [place] appointment information. Scheduled for [facility] on 11/15 arrive at 8:45 for 9:00 am, NPO after midnight. She will also need to get her labwork done that day too, order in Epic.

## 2017-10-24 NOTE — Telephone Encounter (Signed)
-----   Message from Doristine Counter, RN sent at 05/21/2017  4:51 PM EDT ----- Schedule for 6 month [place] RUQ, also have pt do lab work (AFP) order in [employer]

## 2017-10-24 NOTE — Addendum Note (Signed)
Addended by: Doristine Counter on: 10/24/2017 09:38 AM   Modules accepted: Orders

## 2017-11-04 ENCOUNTER — Encounter: Payer: Self-pay | Admitting: Internal Medicine

## 2017-11-04 DIAGNOSIS — Z1231 Encounter for screening mammogram for malignant neoplasm of breast: Secondary | ICD-10-CM | POA: Diagnosis not present

## 2017-11-04 DIAGNOSIS — Z853 Personal history of malignant neoplasm of breast: Secondary | ICD-10-CM | POA: Diagnosis not present

## 2017-11-04 LAB — HM MAMMOGRAPHY

## 2017-11-05 ENCOUNTER — Other Ambulatory Visit: Payer: Self-pay | Admitting: Internal Medicine

## 2017-11-05 NOTE — Telephone Encounter (Signed)
[place] Database verified and compliance confirmed

## 2017-11-05 NOTE — Telephone Encounter (Signed)
XQ'J called in

## 2017-11-07 ENCOUNTER — Ambulatory Visit (HOSPITAL_COMMUNITY)
Admission: RE | Admit: 2017-11-07 | Discharge: 2017-11-07 | Disposition: A | Discharge status: Home / Self Care | Payer: Medicare Other | Source: Ambulatory Visit | Attending: Gastroenterology | Admitting: Gastroenterology

## 2017-11-07 ENCOUNTER — Other Ambulatory Visit: Payer: Medicare Other

## 2017-11-07 DIAGNOSIS — K7031 Alcoholic cirrhosis of liver with ascites: Secondary | ICD-10-CM

## 2017-11-07 DIAGNOSIS — K8020 Calculus of gallbladder without cholecystitis without obstruction: Secondary | ICD-10-CM | POA: Insufficient documentation

## 2017-11-07 DIAGNOSIS — K7030 Alcoholic cirrhosis of liver without ascites: Secondary | ICD-10-CM | POA: Diagnosis not present

## 2017-11-07 DIAGNOSIS — K7460 Unspecified cirrhosis of liver: Secondary | ICD-10-CM | POA: Diagnosis not present

## 2017-11-08 ENCOUNTER — Telehealth: Payer: Self-pay

## 2017-11-08 LAB — AFP TUMOR MARKER: AFP-Tumor Marker: 3.2 ng/mL

## 2017-11-08 NOTE — Telephone Encounter (Signed)
Error encounter. 

## 2017-11-08 NOTE — Telephone Encounter (Deleted)
Patient was contacted by Sports Medicine and stated she did not wish to pursue referral at this time, message sent to ordering provider Mariea Clonts, Tiffany L, DO

## 2017-11-18 ENCOUNTER — Other Ambulatory Visit: Payer: Self-pay | Admitting: *Deleted

## 2017-11-18 MED ORDER — POTASSIUM CHLORIDE ER 10 MEQ PO TBCR: 10.0000 meq | EXTENDED_RELEASE_TABLET | Freq: Every day | ORAL | 0 refills | Status: DC

## 2017-11-18 MED ORDER — FUROSEMIDE 40 MG PO TABS: 40.0000 mg | ORAL_TABLET | Freq: Every morning | ORAL | 0 refills | Status: DC

## 2017-11-18 NOTE — Addendum Note (Signed)
Addended by: Despina Hidden on: 11/18/2017 03:09 PM   Modules accepted: Orders

## 2017-11-22 ENCOUNTER — Encounter: Payer: Self-pay | Admitting: *Deleted

## 2017-12-15 ENCOUNTER — Other Ambulatory Visit: Payer: Self-pay | Admitting: Internal Medicine

## 2017-12-19 ENCOUNTER — Other Ambulatory Visit: Payer: Self-pay | Admitting: *Deleted

## 2017-12-19 MED ORDER — CITALOPRAM HYDROBROMIDE 10 MG PO TABS: 10.0000 mg | ORAL_TABLET | Freq: Every day | ORAL | 3 refills | Status: DC

## 2017-12-19 NOTE — Telephone Encounter (Signed)
[place]

## 2017-12-30 ENCOUNTER — Encounter: Payer: Self-pay | Admitting: Internal Medicine

## 2017-12-30 ENCOUNTER — Ambulatory Visit (INDEPENDENT_AMBULATORY_CARE_PROVIDER_SITE_OTHER): Payer: Medicare Other | Admitting: Internal Medicine

## 2017-12-30 VITALS — BP 110/70 | HR 63 | Temp 97.8°F | Wt 180.0 lb

## 2017-12-30 DIAGNOSIS — E538 Deficiency of other specified B group vitamins: Secondary | ICD-10-CM | POA: Diagnosis not present

## 2017-12-30 DIAGNOSIS — K7030 Alcoholic cirrhosis of liver without ascites: Secondary | ICD-10-CM

## 2017-12-30 DIAGNOSIS — F5104 Psychophysiologic insomnia: Secondary | ICD-10-CM | POA: Diagnosis not present

## 2017-12-30 DIAGNOSIS — R2681 Unsteadiness on feet: Secondary | ICD-10-CM | POA: Diagnosis not present

## 2017-12-30 DIAGNOSIS — E785 Hyperlipidemia, unspecified: Secondary | ICD-10-CM | POA: Diagnosis not present

## 2017-12-30 DIAGNOSIS — C880 Waldenstrom macroglobulinemia: Secondary | ICD-10-CM

## 2017-12-30 DIAGNOSIS — E890 Postprocedural hypothyroidism: Secondary | ICD-10-CM | POA: Diagnosis not present

## 2017-12-30 DIAGNOSIS — K7031 Alcoholic cirrhosis of liver with ascites: Secondary | ICD-10-CM | POA: Diagnosis not present

## 2017-12-30 DIAGNOSIS — I8510 Secondary esophageal varices without bleeding: Secondary | ICD-10-CM | POA: Diagnosis not present

## 2017-12-30 DIAGNOSIS — Z23 Encounter for immunization: Secondary | ICD-10-CM | POA: Diagnosis not present

## 2017-12-30 DIAGNOSIS — L219 Seborrheic dermatitis, unspecified: Secondary | ICD-10-CM | POA: Diagnosis not present

## 2017-12-30 DIAGNOSIS — F339 Major depressive disorder, recurrent, unspecified: Secondary | ICD-10-CM

## 2017-12-30 MED ORDER — CITALOPRAM HYDROBROMIDE 40 MG PO TABS: 40.0000 mg | ORAL_TABLET | Freq: Every day | ORAL | 3 refills | Status: DC

## 2017-12-30 MED ORDER — KETOCONAZOLE 2 % EX SHAM: 1.0000 "application " | MEDICATED_SHAMPOO | CUTANEOUS | 3 refills | Status: DC

## 2017-12-30 MED ORDER — FUROSEMIDE 20 MG PO TABS: 20.0000 mg | ORAL_TABLET | Freq: Every morning | ORAL | 3 refills | Status: DC

## 2017-12-30 NOTE — Patient Instructions (Addendum)
Goal is to get 2000 steps daily this week, then try for 3000 steps beginning Sunday.    Increase celexa to 76m daily.    Try nizoral shampoo twice a week for scaly skin on scalp.  Decrease lasix to 216mdaily.  Monitor for weight gain, fluid retention, shortness of breath.

## 2017-12-30 NOTE — Progress Notes (Signed)
Location:  [facility] clinic Provider:  Diontay Rosencrans L. Mariea Clonts, D.O., C.M.D.  Code Status: DNR Goals of Care:  Advanced Directives 09/09/2017  Does Patient Have a Medical Advance Directive? Yes  Type of Advance Directive Out of facility DNR (pink MOST or yellow form)  Copy of [place] in Chart? -  Pre-existing out of facility DNR order (yellow form or pink MOST form) Yellow form placed in chart (order not valid for inpatient use);Pink MOST form placed in chart (order not valid for inpatient use)   Chief Complaint  Patient presents with  . Medical Management of Chronic Issues    3MTH FOLLOW-UP    HPI: Patient is a 72 y.o. female seen today for medical management of chronic diseases.    Says she is pretty good.  Put on a few lbs over Christmas.  Says she is the same as far as getting around.    Shoulder remains painful.  Wants surgery.  Uses the cane and the walker.  She is interested in some PT beforehand.  Wears a fitbit, but she has not been active enough.    Her husband passed away late last year.  Bereavement counseling makes her cry more. Her children  have gotten her tapered off ativan.  Still struggling to sleep.  Mostly has trouble getting to sleep.  Sometimes trouble staying asleep.  Tries not to nap in the daytime.    She reports that she is taking small bites but family notices, food will not go down sometimes.  Had been like this with pills.  She is on propranolol which helped her problem with swallowing pills.  Now happening with meat.  Has an appt coming up with GI.  Sees Dr. Havery Moros Thursday.  No increase in pains.  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  No nightsweats.  Gets cold real easy.    No reported confusion/encephalopathy--"nothing more than usual".    Hypothyroidism:  Had adjusted synthroid.  Needs recheck  Scalp better, but medicated shampoo not resolving it altogether.  Stays itchy even after she shampoos.  Using tar shampoo with selsun blue mixed  together.  Let's it sit for an hour.    Past Medical History:  Diagnosis Date  . Abnormal EKG   . Abnormal finding on thyroid function test   . Abnormal liver function test   . Acute edema   . Alcoholic cirrhosis ([place]) 14/48/1856   Possible NASH overlap (MELD 13)  . Anemia   . Anxiety   . Arthritis   . Ascites   . Back pain   . Breast mass   . Chronic low back pain   . Complete right rotator cuff tear   . Compression fracture of L4 lumbar vertebra (HCC)   . Dermatitis, eczematoid   . Difficulty breathing   . Gallstones   . Hepatitis   . History of adenocarcinoma of breast   . Hypercholesterolemia   . Hyperkalemia   . Hypertension   . Hypokalemia   . Hypothyroidism   . Insomnia   . Knee pain   . Leukocytosis   . Lymphadenopathy   . Macrocytosis   . Memory loss or impairment   . Menopause   . MGUS (monoclonal gammopathy of unknown significance)   . Osteoarthritis   . Osteoarthritis of right knee   . Other cirrhosis of liver ([place])   . Renal insufficiency syndrome   . Right knee DJD   . Solitary thyroid nodule   . Unspecified lump in the  left breast, unspecified quadrant   . Vitamin B 12 deficiency   . 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[place])     Past Surgical History:  Procedure Laterality Date  . BREAST SURGERY  2014   Removed lymphnodes  . LESION REMOVAL  09/2015   tubular adenoma-4 subcentimeter lesions  . TOTAL KNEE ARTHROPLASTY Right 09/05/2016    No Known Allergies  Outpatient Encounter Medications as of 12/30/2017  Medication Sig  . buPROPion (WELLBUTRIN XL) 150 MG 24 hr tablet TAKE ONE TABLET BY MOUTH DAILY  . Cholecalciferol (VITAMIN D3) 2000 units TABS Take 1 tablet by mouth every morning.  . citalopram (CELEXA) 10 MG tablet Take 1 tablet (10 mg total) by mouth daily.  . furosemide (LASIX) 40 MG tablet Take 1 tablet (40 mg total) by mouth every morning.  Marland Kitchen LACTULOSE PO Take 15 mLs by mouth every morning.  Marland Kitchen levothyroxine (SYNTHROID, LEVOTHROID) 88 MCG  tablet TAKE 1 TABELT BY MOUTH ONCE A  DAY 30 MINUTES BEFORE BREAKFAST AND BEFORE ANY OTHER MEDICATIONS  . LORazepam (ATIVAN) 0.5 MG tablet TAKE ONE TABLET BY MOUTH EVERY 6 HOURS AS NEEDED FOR ANXIETY  . Melatonin 10 MG TABS Take one tablet by mouth once daily at bedtime for rest  . Multiple Vitamin (MULTIVITAMIN) tablet Take 1 tablet by mouth daily.  . potassium chloride (K-DUR) 10 MEQ tablet Take 1 tablet (10 mEq total) by mouth daily.  . propranolol (INDERAL) 20 MG tablet Take 1 tablet (20 mg total) by mouth every morning.  Marland Kitchen spironolactone (ALDACTONE) 100 MG tablet Take 100 mg by mouth every morning.  . traMADol (ULTRAM) 50 MG tablet TAKE ONE TABLET BY MOUTH TWICE A DAY AS NEEDED FOR PAIN  . vitamin B-12 (CYANOCOBALAMIN) 1000 MCG tablet Take 1,000 mcg by mouth every morning.   No facility-administered encounter medications on file as of 12/30/2017.     Review of Systems:  Review of Systems  Constitutional: Positive for malaise/fatigue. Negative for chills and fever.  HENT: Negative for congestion.   Eyes: Negative for blurred vision.  Respiratory: Negative for cough and shortness of breath.   Cardiovascular: Negative for chest pain, palpitations and leg swelling.  Gastrointestinal: Negative for abdominal pain, blood in stool, constipation and melena.       Takes am lactulose  Genitourinary: Negative for dysuria.  Musculoskeletal: Negative for falls.       Unsteady gait, uses cane or walker--does better with walker  Skin:       Dry scaly scalp posteriorly, better, but ongoing with otc tar shampoo  Neurological: Positive for tingling and sensory change. Negative for dizziness.  Psychiatric/Behavioral: Positive for depression and memory loss. The patient has insomnia. The patient is not nervous/anxious.     Health Maintenance  Topic Date Due  . Hepatitis C Screening  08-29-1946  . PNA vac Low Risk Adult (2 of 2 - PCV13) 06/25/2014  . MAMMOGRAM  11/05/2019  . TETANUS/TDAP  09/01/2025   . COLONOSCOPY  10/19/2025  . INFLUENZA VACCINE  Completed  . DEXA SCAN  Completed    Physical Exam: Vitals:   12/30/17 1520  Weight: 180 lb (81.6 kg)   Body mass index is 37.62 kg/m. Physical Exam  Constitutional: She is oriented to person, place, and time. She appears well-developed. No distress.  Chronically ill appearing  HENT:  Head: Normocephalic and atraumatic.  Cardiovascular: Normal rate, regular rhythm, normal heart sounds and intact distal pulses.  Pulmonary/Chest: Effort normal and breath sounds normal.  Abdominal: Bowel sounds are normal. She  exhibits no distension. There is no tenderness.  Musculoskeletal:  Unsteady waddling gait with cane; limited ROM right shoulder and tenderness  Neurological: She is alert and oriented to person, place, and time.  Skin: Skin is warm and dry. There is pallor.  Dry scaly scalp posteriorly, erythematous base improved and fewer scales, but still present  Psychiatric: She has a normal mood and affect.  Tearful when     Labs reviewed: Basic Metabolic Panel: Recent Labs    05/06/17 1604 05/17/17 1033 09/09/17 0403  NA 135  --  136  K 4.7  --  4.2  CL 99  --  99  CO2 29  --  30  GLUCOSE 91  --  91  BUN 26*  --  20  CREATININE 0.93  --  1.09*  CALCIUM 9.8  --  9.5  TSH  --  4.16 5.93*   Liver Function Tests: Recent Labs    05/06/17 1604 09/09/17 0403  AST 30 22  ALT 26 17  ALKPHOS 90  --   BILITOT 0.7 0.5  PROT 8.1 7.2  ALBUMIN 3.8  --    No results for input(s): LIPASE, AMYLASE in the last 8760 hours. Recent Labs    09/09/17 0403  AMMONIA 72   CBC: Recent Labs    05/06/17 1604 09/09/17 0403  WBC 10.2 9.5  NEUTROABS 5.9 5,349  HGB 13.3 13.4  HCT 38.9 39.4  MCV 90.1 88.3  PLT 258.0 264   Lipid Panel: Recent Labs    05/17/17 1033  CHOL 190  HDL 74  LDLCALC 100*  TRIG 81  CHOLHDL 2.6   Assessment/Plan 1. Depression, recurrent ([place]) -worse since her husband's death, family upped celexa, but  still tearful and inactive, her daughter reports that some of this is longstanding and just worse lately -also on wellbutrin -we opted to up celexa to 106m today -cont hospice bereavement counseling and additional bereavement counseling as requesting through a home care company -encouraged exercise--given step goals to go off of  2. Waldenstrom's macroglobulinemia ([place] -cont to follow labs--cbc checked today, family wanting a more palliative approach overall  3. Esophageal varices in alcoholic cirrhosis (HCC) -cont mgt per GI, keep f/u Thursday and also discuss dysphagia recently with meats--may need EGD or simply MBS  4. Psychophysiological insomnia -stop ativan due to cognitive effects and pt oversedating herself per family -cont celexa and wellbutrin -avoid daytime naps and encourage activity  5. Alcoholic cirrhosis of liver with ascites ([place] -keep f/u with GI  -cont lactulose, propranolol, aldactone, cut down on lasix due to dry appearance and no longer drinking alcohol, is hydrating decently with water and other nonalcoholic options  6. B12 deficiency -cont B12 supplement  7. Postoperative hypothyroidism -f/u tsh, cont current levothyroxine pending results  8. Hyperlipidemia, unspecified hyperlipidemia type -not on medications, given other issues, would probably avoid checking this, lately she's been eating poorly, too--lots of sweets so likely will be high -again, encourage activity  9.  Need for 13-valent pneumonia vaccine:  prevnar given  PT ordered for unsteady waddling gait and exercise regimen  Labs/tests ordered:   Orders Placed This Encounter  Procedures  . CBC with Differential/Platelet  . Hepatic function panel  . Basic metabolic panel    Order Specific Question:   Has the patient fasted?    Answer:   Yes  . TSH   Next appt:  4 mos med mgt   Rogena Deupree L. Ardean Melroy, D.O. [facility] [address],  [place] [address] Cell Phone (Mon-Fri 8am-5pm):  [phone] On Call:  [phone] & follow prompts after 5pm & weekends Office Phone:  [phone] Office Fax:  [phone]

## 2017-12-31 LAB — HEPATIC FUNCTION PANEL
AG Ratio: 0.9 (calc) — ABNORMAL LOW (ref 1.0–2.5)
ALT: 14 U/L (ref 6–29)
AST: 22 U/L (ref 10–35)
Albumin: 3.6 g/dL (ref 3.6–5.1)
Alkaline phosphatase (APISO): 68 U/L (ref 33–130)
Bilirubin, Direct: 0.2 mg/dL (ref 0.0–0.2)
Globulin: 4.1 g/dL (calc) — ABNORMAL HIGH (ref 1.9–3.7)
Indirect Bilirubin: 0.7 mg/dL (calc) (ref 0.2–1.2)
Total Bilirubin: 0.9 mg/dL (ref 0.2–1.2)
Total Protein: 7.7 g/dL (ref 6.1–8.1)

## 2017-12-31 LAB — TSH: TSH: 0.79 mIU/L (ref 0.40–4.50)

## 2017-12-31 LAB — CBC WITH DIFFERENTIAL/PLATELET
Basophils Absolute: 62 cells/uL (ref 0–200)
Basophils Relative: 0.7 %
Eosinophils Absolute: 62 cells/uL (ref 15–500)
Eosinophils Relative: 0.7 %
HCT: 37.2 % (ref 35.0–45.0)
Hemoglobin: 12.6 g/dL (ref 11.7–15.5)
Lymphs Abs: 2138 cells/uL (ref 850–3900)
MCH: 30.7 pg (ref 27.0–33.0)
MCHC: 33.9 g/dL (ref 32.0–36.0)
MCV: 90.5 fL (ref 80.0–100.0)
MPV: 10.4 fL (ref 7.5–12.5)
Monocytes Relative: 11.3 %
Neutro Abs: 5544 cells/uL (ref 1500–7800)
Neutrophils Relative %: 63 %
Platelets: 215 10*3/uL (ref 140–400)
RBC: 4.11 10*6/uL (ref 3.80–5.10)
RDW: 11.4 % (ref 11.0–15.0)
Total Lymphocyte: 24.3 %
WBC mixed population: 994 cells/uL — ABNORMAL HIGH (ref 200–950)
WBC: 8.8 10*3/uL (ref 3.8–10.8)

## 2017-12-31 LAB — BASIC METABOLIC PANEL
BUN: 14 mg/dL (ref 7–25)
CO2: 28 mmol/L (ref 20–32)
Calcium: 9.7 mg/dL (ref 8.6–10.4)
Chloride: 99 mmol/L (ref 98–110)
Creat: 0.87 mg/dL (ref 0.60–0.93)
Glucose, Bld: 90 mg/dL (ref 65–139)
Potassium: 4.4 mmol/L (ref 3.5–5.3)
Sodium: 135 mmol/L (ref 135–146)

## 2018-01-02 ENCOUNTER — Other Ambulatory Visit (INDEPENDENT_AMBULATORY_CARE_PROVIDER_SITE_OTHER): Payer: Medicare Other

## 2018-01-02 ENCOUNTER — Encounter: Payer: Self-pay | Admitting: Gastroenterology

## 2018-01-02 ENCOUNTER — Ambulatory Visit (INDEPENDENT_AMBULATORY_CARE_PROVIDER_SITE_OTHER): Payer: Medicare Other | Admitting: Gastroenterology

## 2018-01-02 VITALS — BP 118/80 | HR 76 | Ht <= 58 in | Wt 180.0 lb

## 2018-01-02 DIAGNOSIS — K7290 Hepatic failure, unspecified without coma: Secondary | ICD-10-CM

## 2018-01-02 DIAGNOSIS — Z86010 Personal history of colonic polyps: Secondary | ICD-10-CM | POA: Diagnosis not present

## 2018-01-02 DIAGNOSIS — R1310 Dysphagia, unspecified: Secondary | ICD-10-CM

## 2018-01-02 DIAGNOSIS — K7682 Hepatic encephalopathy: Secondary | ICD-10-CM

## 2018-01-02 DIAGNOSIS — K7031 Alcoholic cirrhosis of liver with ascites: Secondary | ICD-10-CM | POA: Diagnosis not present

## 2018-01-02 DIAGNOSIS — I8500 Esophageal varices without bleeding: Secondary | ICD-10-CM

## 2018-01-02 DIAGNOSIS — K7460 Unspecified cirrhosis of liver: Secondary | ICD-10-CM | POA: Diagnosis not present

## 2018-01-02 LAB — IBC PANEL
IRON: 97 ug/dL (ref 42–145)
Saturation Ratios: 36.7 % (ref 20.0–50.0)
Transferrin: 189 mg/dL — ABNORMAL LOW (ref 212.0–360.0)

## 2018-01-02 LAB — PROTIME-INR
INR: 1.2 ratio — ABNORMAL HIGH (ref 0.8–1.0)
PROTHROMBIN TIME: 12.9 s (ref 9.6–13.1)

## 2018-01-02 LAB — FERRITIN: Ferritin: 123.8 ng/mL (ref 10.0–291.0)

## 2018-01-02 NOTE — Progress Notes (Signed)
HPI :  72 y/o female here for a follow up visit for suspected alcoholic cirrhosis.   See prior notes for details of her case. Previously diagnosed in [place] a few years ago. She has a history of ascites, small varices, and hepatic encephalopathy. She previously reported history of drinking scotch, roughly 4-5 drinks a day ongoing for several years. She has not drank alcohol since her diagnosis of cirrhosis. I was able to obtain some records from her previous gastroenterologist since her last visit. She had an endoscopy in 2016 showing small esophageal varices and portal hypertensive gastritis. Was placed on propranolol at that time. Her son shows me records of her vital signs over the past several months. Her heart rate ranges from 60s to 70s. BP stable.  She currently feels well today without dizziness or lightheadedness. She had an ultrasound in November since her last visit showing changes of cirrhosis without any focal abnormality noted, and small gallstones. She denies any abdominal pains at this time. She denies any issues with ascites that are bothering her, and her edema is well controlled. She recently had her Lasix reduced to 20 mg a day due to feelings of dehydration. She does not take any NSAIDs routinely.  A new symptom she complained of today's dysphagia. She endorses feeling this in the midesophagus, usually with eating meats. She has been having to cut her food well to minimize symptoms. This usually occurs once or twice a month, and does not usually bother her unless eating bulky foods. Symptoms appear to be ongoing for 9 months. She has no dysphagia to liquids. She denies any heartburn or reflux symptoms.  Recent workup: [place] 11/07/2017 - changes of cirrhosis, small gallstones, no focal abnormality noted. AFP level 3.2  Records reviewed from [place]: 2016 - EGD - small esophageal varices, mild PHG 2016 - colonoscopy - "4 polyps removed" - no details of what type or  size  Past Medical History:  Diagnosis Date  . Abnormal EKG   . Abnormal finding on thyroid function test   . Abnormal liver function test   . Acute edema   . Alcoholic cirrhosis ([place]) 03/54/6568   Possible NASH overlap (MELD 13)  . Anemia   . Anxiety   . Arthritis   . Ascites   . Back pain   . Breast mass   . Chronic low back pain   . Complete right rotator cuff tear   . Compression fracture of L4 lumbar vertebra (HCC)   . Dermatitis, eczematoid   . Difficulty breathing   . Gallstones   . Hepatitis   . History of adenocarcinoma of breast   . Hypercholesterolemia   . Hyperkalemia   . Hypertension   . Hypokalemia   . Hypothyroidism   . Insomnia   . Knee pain   . Leukocytosis   . Lymphadenopathy   . Macrocytosis   . Memory loss or impairment   . Menopause   . MGUS (monoclonal gammopathy of unknown significance)   . Osteoarthritis   . Osteoarthritis of right knee   . Other cirrhosis of liver ([place])   . Renal insufficiency syndrome   . Right knee DJD   . Solitary thyroid nodule   . Unspecified lump in the left breast, unspecified quadrant   . Vitamin B 12 deficiency   . 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[place])      Past Surgical History:  Procedure Laterality Date  . BREAST SURGERY  2014   Removed lymphnodes  .  LESION REMOVAL  09/2015   tubular adenoma-4 subcentimeter lesions  . TOTAL KNEE ARTHROPLASTY Right 09/05/2016   Family History  Problem Relation Age of Onset  . Breast cancer Mother   . Colon cancer Neg Hx   . Stomach cancer Neg Hx   . Rectal cancer Neg Hx   . Liver cancer Neg Hx   . Esophageal cancer Neg Hx    Social History   Tobacco Use  . Smoking status: Never Smoker  . Smokeless tobacco: Never Used  Substance Use Topics  . Alcohol use: No    Comment: 6 or more per day in the past.   . Drug use: No   Current Outpatient Medications  Medication Sig Dispense Refill  . buPROPion (WELLBUTRIN XL) 150 MG 24 hr tablet TAKE ONE TABLET BY MOUTH  DAILY 30 tablet 3  . Cholecalciferol (VITAMIN D3) 2000 units TABS Take 1 tablet by mouth every morning.    . citalopram (CELEXA) 40 MG tablet Take 1 tablet (40 mg total) by mouth daily. 90 tablet 3  . furosemide (LASIX) 20 MG tablet Take 1 tablet (20 mg total) by mouth every morning. 90 tablet 3  . ibuprofen (ADVIL,MOTRIN) 200 MG tablet Take 400 mg by mouth daily as needed.    Marland Kitchen ketoconazole (NIZORAL) 2 % shampoo Apply 1 application topically 2 (two) times a week. 120 mL 3  . LACTULOSE PO Take 15 mLs by mouth every morning.    Marland Kitchen levothyroxine (SYNTHROID, LEVOTHROID) 88 MCG tablet TAKE 1 TABELT BY MOUTH ONCE A  DAY 30 MINUTES BEFORE BREAKFAST AND BEFORE ANY OTHER MEDICATIONS 90 tablet 0  . Melatonin 10 MG TABS Take one tablet by mouth once daily at bedtime for rest 30 tablet 0  . Multiple Vitamin (MULTIVITAMIN) tablet Take 1 tablet by mouth daily.    . potassium chloride (K-DUR) 10 MEQ tablet Take 1 tablet (10 mEq total) by mouth daily. 90 tablet 0  . propranolol (INDERAL) 20 MG tablet Take 1 tablet (20 mg total) by mouth every morning. 90 tablet 3  . spironolactone (ALDACTONE) 100 MG tablet Take 100 mg by mouth every morning.    . traMADol (ULTRAM) 50 MG tablet Take 25 mg by mouth daily as needed.    . vitamin B-12 (CYANOCOBALAMIN) 1000 MCG tablet Take 1,000 mcg by mouth every morning.     No current facility-administered medications for this visit.    No Known Allergies   Review of Systems: All systems reviewed and negative except where noted in HPI.   Lab Results  Component Value Date   WBC 8.8 12/30/2017   HGB 12.6 12/30/2017   HCT 37.2 12/30/2017   MCV 90.5 12/30/2017   PLT 215 12/30/2017    Lab Results  Component Value Date   CREATININE 0.87 12/30/2017   BUN 14 12/30/2017   NA 135 12/30/2017   K 4.4 12/30/2017   CL 99 12/30/2017   CO2 28 12/30/2017    Lab Results  Component Value Date   ALT 14 12/30/2017   AST 22 12/30/2017   ALKPHOS 90 05/06/2017   BILITOT 0.9  12/30/2017    Lab Results  Component Value Date   INR 1.1 (H) 05/06/2017     Physical Exam: BP 118/80 (BP Location: Left Arm, Patient Position: Sitting, Cuff Size: Normal)   Pulse 76   Ht 4' 9.25" (1.454 m) Comment: height measured without shoes  Wt 180 lb (81.6 kg)   BMI 38.61 kg/m  Constitutional: Pleasant,  female  in no acute distress. HEENT: Normocephalic and atraumatic. Conjunctivae are normal. No scleral icterus. Neck supple.  Cardiovascular: Normal rate, regular rhythm.  Pulmonary/chest: Effort normal and breath sounds normal. No wheezing, rales or rhonchi. Abdominal: Soft, protuberant, nontender. . There are no masses palpable. No hepatomegaly. Extremities: trace edema LE Lymphadenopathy: No cervical adenopathy noted. Neurological: Alert and oriented to person place and time. Skin: Skin is warm and dry. No rashes noted. Psychiatric: Normal mood and affect. Behavior is normal.   ASSESSMENT AND PLAN: 72 year old female here for reassessment following issues:  Alcoholic cirrhosis / ascites / encephalopathy / esophageal varices - overall doing pretty well since I have last seen her. Ascites well controlled on her diuretics, encephalopathy controlled on lactulose, and seems to be tolerating propranolol. With her resting HR 60-70s at highest would not increase dose. Will continue her meds at present dosing. She is due for repeat [place] screening in May. Otherwise, I could not get records of prior labs that were done in [place] for ruling out other chronic liver disease that was done for her. Will send some basic labs today for that, vaccinate to hep A / B if needed. Otherwise follow up in summertime if otherwise stable and continue alcohol abstinence. She agreed.   Dysphagia - intermittent solid food dysphagia. Discussed ddx with her, suspect ring / stricture. She had an EGD in 2016 which only showed small varices, no other concerning pathology. I discussed EGD versus barium study to  evaluate this. She and her son want to avoid any invasive procedures if possible, thus will start with barium study. If nothing too concerning on that exam she would prefer to monitor for now which is reasonable. Will let her know results.  History of colon polyps - will try to obtain colonoscopy report to clarify findings and to determine if she should have any further surveillance at some point in time.   [place] Cellar, MD [facility] Pager [phone]

## 2018-01-02 NOTE — Patient Instructions (Signed)
If you are age 74 or older, your body mass index should be between 23-30. Your Body mass index is 38.61 kg/m. If this is out of the aforementioned range listed, please consider follow up with your Primary Care Provider.  If you are age 76 or younger, your body mass index should be between 19-25. Your Body mass index is 38.61 kg/m. If this is out of the aformentioned range listed, please consider follow up with your Primary Care Provider.   Please go to the lab in the basement of our building to have lab work done as you leave today.  You have been scheduled for a Barium Esophogram at [facility] (1st floor of the hospital) on Wednesday, 01-08-18 at 1:30PM. Please arrive 15 minutes prior to your appointment for registration. Make certain not to have anything to eat or drink 3 hours prior to your test. If you need to reschedule for any reason, please contact radiology at [phone] to do so. __________________________________________________________________ A barium swallow is an examination that concentrates on views of the esophagus. This tends to be a double contrast exam (barium and two liquids which, when combined, create a gas to distend the wall of the oesophagus) or single contrast (non-ionic iodine based). The study is usually tailored to your symptoms so a good history is essential. Attention is paid during the study to the form, structure and configuration of the esophagus, looking for functional disorders (such as aspiration, dysphagia, achalasia, motility and reflux) EXAMINATION You may be asked to change into a gown, depending on the type of swallow being performed. A radiologist and radiographer will perform the procedure. The radiologist will advise you of the type of contrast selected for your procedure and direct you during the exam. You will be asked to stand, sit or lie in several different positions and to hold a small amount of fluid in your mouth before being asked to  swallow while the imaging is performed .In some instances you may be asked to swallow barium coated marshmallows to assess the motility of a solid food bolus. The exam can be recorded as a digital or video fluoroscopy procedure. POST PROCEDURE It will take 1-2 days for the barium to pass through your system. To facilitate this, it is important, unless otherwise directed, to increase your fluids for the next 24-48hrs and to resume your normal diet.  This test typically takes about 30 minutes to perform.  ____________________________________________________________________________   Please follow up with [place] this summer.  Thank you for entrusting me with your care and for [facility], Dr. [place] Cellar

## 2018-01-07 ENCOUNTER — Encounter: Payer: Self-pay | Admitting: Nurse Practitioner

## 2018-01-07 ENCOUNTER — Ambulatory Visit (INDEPENDENT_AMBULATORY_CARE_PROVIDER_SITE_OTHER): Payer: Medicare Other | Admitting: Nurse Practitioner

## 2018-01-07 VITALS — BP 110/78 | HR 62 | Temp 98.2°F | Ht <= 58 in | Wt 179.2 lb

## 2018-01-07 DIAGNOSIS — R1013 Epigastric pain: Secondary | ICD-10-CM | POA: Diagnosis not present

## 2018-01-07 DIAGNOSIS — R82998 Other abnormal findings in urine: Secondary | ICD-10-CM | POA: Diagnosis not present

## 2018-01-07 LAB — POCT URINALYSIS DIPSTICK
BILIRUBIN UA: NEGATIVE
Glucose, UA: NEGATIVE
KETONES UA: NEGATIVE
Nitrite, UA: NEGATIVE
Protein, UA: NEGATIVE
RBC UA: NEGATIVE
SPEC GRAV UA: 1.01 (ref 1.010–1.025)
UROBILINOGEN UA: 0.2 U/dL
pH, UA: 7 (ref 5.0–8.0)

## 2018-01-07 LAB — HEPATITIS B SURFACE ANTIGEN: Hepatitis B Surface Ag: NONREACTIVE

## 2018-01-07 LAB — HEPATITIS C ANTIBODY
Hepatitis C Ab: NONREACTIVE
SIGNAL TO CUT-OFF: 0.01 (ref <1.00)

## 2018-01-07 LAB — HEPATITIS A ANTIBODY, TOTAL: Hepatitis A AB,Total: NONREACTIVE

## 2018-01-07 LAB — IGG: IgG (Immunoglobin G), Serum: 577 mg/dL — ABNORMAL LOW (ref 694–1618)

## 2018-01-07 LAB — ANA: Anti Nuclear Antibody(ANA): NEGATIVE

## 2018-01-07 LAB — ANTI-SMOOTH MUSCLE ANTIBODY, IGG

## 2018-01-07 LAB — HEPATITIS B SURFACE ANTIBODY,QUALITATIVE: Hep B S Ab: NONREACTIVE

## 2018-01-07 NOTE — Progress Notes (Signed)
Careteam: Patient Care Team: Gayland Curry, DO as PCP - General (Geriatric Medicine) Armbruster, [name], MD as Consulting Physician (Gastroenterology)  Advanced Directive information    No Known Allergies  Chief Complaint  Patient presents with  . Acute Visit    Pt is being seen due 2 episodes of dark urine today. denies any urinary symptom. Pt reports severe mid abdominal pain yesterday and pt vomited about 1 hour after onset of pain. Pt does have galstones.   . Other    Daughter in room      HPI: Patient is a 72 y.o. female seen in the office today due to abdominal pain. Yesterday got a sharp abdominal pain into her back- like a belt that wrapped around- with nausea and vomiting x 2. Abdominal pain was ongoing. Today there is a discomfort but much better than yesterday.  Her son is a Marine scientist and came over and "pushed" on her but nothing reproduced pain.  No fever or chills.  No nausea or vomiting today.  Has not eaten much today- just toast- afraid of throwing up.  Has a MBS test tomorrow due to choking and trouble swallowing.  No diarrhea.  No abdominal pain.  Reports there was gallstones found on an [place] she had in the past but has never had pain associated with this before.  Dark yellow/red urine this morning, but still dark. Could not urinate the last time she tried. No dyuria- has an hx of UTI Has had 4 big glasses of water/gatorade today.  No BM.   Review of Systems:  Review of Systems  Constitutional: Positive for malaise/fatigue. Negative for chills and fever.  Eyes: Negative for blurred vision.  Respiratory: Negative for cough and shortness of breath.   Cardiovascular: Negative for chest pain, palpitations and leg swelling.  Gastrointestinal: Positive for abdominal pain. Negative for blood in stool, constipation, diarrhea, heartburn, melena, nausea and vomiting.       Takes am lactulose for regular BMs  Genitourinary: Negative for dysuria, frequency and  urgency.       Color changes to urine, very dark and red like  Musculoskeletal: Negative for falls.       Unsteady gait, uses cane or walker--does better with walker- not using assistive device today  Neurological: Negative for dizziness.  Psychiatric/Behavioral: Positive for depression and memory loss. The patient has insomnia. The patient is not nervous/anxious.     Past Medical History:  Diagnosis Date  . Abnormal EKG   . Abnormal finding on thyroid function test   . Abnormal liver function test   . Acute edema   . Alcoholic cirrhosis ([place]) 47/42/5956   Possible NASH overlap (MELD 13)  . Anemia   . Anxiety   . Arthritis   . Ascites   . Back pain   . Breast mass   . Chronic low back pain   . Complete right rotator cuff tear   . Compression fracture of L4 lumbar vertebra (HCC)   . Dermatitis, eczematoid   . Difficulty breathing   . Gallstones   . Hepatitis   . History of adenocarcinoma of breast   . Hypercholesterolemia   . Hyperkalemia   . Hypertension   . Hypokalemia   . Hypothyroidism   . Insomnia   . Knee pain   . Leukocytosis   . Lymphadenopathy   . Macrocytosis   . Memory loss or impairment   . Menopause   . MGUS (monoclonal gammopathy of unknown significance)   .  Osteoarthritis   . Osteoarthritis of right knee   . Other cirrhosis of liver ([place])   . Renal insufficiency syndrome   . Right knee DJD   . Solitary thyroid nodule   . Unspecified lump in the left breast, unspecified quadrant   . Vitamin B 12 deficiency   . 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[place])    Past Surgical History:  Procedure Laterality Date  . BREAST SURGERY  2014   Removed lymphnodes  . LESION REMOVAL  09/2015   tubular adenoma-4 subcentimeter lesions  . TOTAL KNEE ARTHROPLASTY Right 09/05/2016   Social History:   reports that  has never smoked. she has never used smokeless tobacco. She reports that she does not drink alcohol or use drugs.  Family History  Problem Relation Age of  Onset  . Breast cancer Mother   . Colon cancer Neg Hx   . Stomach cancer Neg Hx   . Rectal cancer Neg Hx   . Liver cancer Neg Hx   . Esophageal cancer Neg Hx     Medications: Patient's Medications  New Prescriptions   No medications on file  Previous Medications   BUPROPION (WELLBUTRIN XL) 150 MG 24 HR TABLET    TAKE ONE TABLET BY MOUTH DAILY   CHOLECALCIFEROL (VITAMIN D3) 2000 UNITS TABS    Take 1 tablet by mouth every morning.   CITALOPRAM (CELEXA) 40 MG TABLET    Take 1 tablet (40 mg total) by mouth daily.   FUROSEMIDE (LASIX) 20 MG TABLET    Take 1 tablet (20 mg total) by mouth every morning.   IBUPROFEN (ADVIL,MOTRIN) 200 MG TABLET    Take 400 mg by mouth daily as needed.   KETOCONAZOLE (NIZORAL) 2 % SHAMPOO    Apply 1 application topically 2 (two) times a week.   LACTULOSE PO    Take 15 mLs by mouth every morning.   LEVOTHYROXINE (SYNTHROID, LEVOTHROID) 88 MCG TABLET    TAKE 1 TABELT BY MOUTH ONCE A  DAY 30 MINUTES BEFORE BREAKFAST AND BEFORE ANY OTHER MEDICATIONS   MELATONIN 10 MG TABS    Take one tablet by mouth once daily at bedtime for rest   MULTIPLE VITAMIN (MULTIVITAMIN) TABLET    Take 1 tablet by mouth daily.   POTASSIUM CHLORIDE (K-DUR) 10 MEQ TABLET    Take 1 tablet (10 mEq total) by mouth daily.   PROPRANOLOL (INDERAL) 20 MG TABLET    Take 1 tablet (20 mg total) by mouth every morning.   SPIRONOLACTONE (ALDACTONE) 100 MG TABLET    Take 100 mg by mouth every morning.   TRAMADOL (ULTRAM) 50 MG TABLET    Take 25 mg by mouth daily as needed.   VITAMIN B-12 (CYANOCOBALAMIN) 1000 MCG TABLET    Take 1,000 mcg by mouth every morning.  Modified Medications   No medications on file  Discontinued Medications   No medications on file     Physical Exam:  Vitals:   01/07/18 1529  BP: 110/78  Pulse: 62  Temp: 98.2 F (36.8 C)  TempSrc: Oral  SpO2: 97%  Weight: 179 lb 3.2 oz (81.3 kg)  Height: 4' 9"  (1.448 m)   Body mass index is 38.78 kg/m.  Physical Exam    Constitutional: She is oriented to person, place, and time. She appears well-developed. No distress.  Chronically ill appearing  HENT:  Head: Normocephalic and atraumatic.  Cardiovascular: Normal rate, regular rhythm and normal heart sounds.  Pulmonary/Chest: Effort normal and breath sounds normal.  Abdominal:  Soft. Bowel sounds are normal. She exhibits no distension and no mass. There is no tenderness. There is no rebound and no guarding. No hernia.  Reports slight discomfort to left upper quadrants. Denies tenderness but states some discomfort   Neurological: She is alert and oriented to person, place, and time.  Skin: Skin is warm and dry. There is pallor.  Psychiatric: She has a normal mood and affect.    Labs reviewed: Basic Metabolic Panel: Recent Labs    05/06/17 1604 05/17/17 1033 09/09/17 0403 12/30/17 1613  NA 135  --  136 135  K 4.7  --  4.2 4.4  CL 99  --  99 99  CO2 29  --  30 28  GLUCOSE 91  --  91 90  BUN 26*  --  20 14  CREATININE 0.93  --  1.09* 0.87  CALCIUM 9.8  --  9.5 9.7  TSH  --  4.16 5.93* 0.79   Liver Function Tests: Recent Labs    05/06/17 1604 09/09/17 0403 12/30/17 1613  AST 30 22 22   ALT 26 17 14   ALKPHOS 90  --   --   BILITOT 0.7 0.5 0.9  PROT 8.1 7.2 7.7  ALBUMIN 3.8  --   --    No results for input(s): LIPASE, AMYLASE in the last 8760 hours. Recent Labs    09/09/17 0403  AMMONIA 72   CBC: Recent Labs    05/06/17 1604 09/09/17 0403 12/30/17 1613  WBC 10.2 9.5 8.8  NEUTROABS 5.9 5,349 5,544  HGB 13.3 13.4 12.6  HCT 38.9 39.4 37.2  MCV 90.1 88.3 90.5  PLT 258.0 264 215   Lipid Panel: Recent Labs    05/17/17 1033  CHOL 190  HDL 74  LDLCALC 100*  TRIG 81  CHOLHDL 2.6   TSH: Recent Labs    05/17/17 1033 09/09/17 0403 12/30/17 1613  TSH 4.16 5.93* 0.79   A1C: No results found for: HGBA1C   Assessment/Plan 1. Epigastric pain -nausea and vomiting has improved, bland diet and to advance as  tolerates -overall abdominal pain has improve and now with discomfort but more "internal"  - Amylase - Lipase - CBC with Differential/Platelets - CMP with eGFR([facility]) - POC Urinalysis Dipstick - US Abdomen Complete order due to hx of of cirrhosis, gallstone and hx of waldenstrom macroglobulinemia   2. Leukocytes in urine -reports blood color in urine which was atypical for her. UA negative for blood but with 3+ leukocytes, will send for culture at this time.  -increase hydration  - Culture, Urine; Future - Culture, Urine  Return/follow up precautions discussed Parvin Stetzer K. Harle Battiest  [facility] [phone] 8 am - 5 pm) [phone] (after hours)

## 2018-01-07 NOTE — Patient Instructions (Signed)
Cont to work on hydration Will get labs today Advance diet slowly as tolerates

## 2018-01-08 ENCOUNTER — Ambulatory Visit (HOSPITAL_COMMUNITY): Admission: RE | Admit: 2018-01-08 | Payer: Medicare Other | Source: Ambulatory Visit

## 2018-01-08 ENCOUNTER — Emergency Department (HOSPITAL_COMMUNITY)
Admission: EM | Admit: 2018-01-08 | Discharge: 2018-01-08 | Disposition: A | Discharge status: Home / Self Care | Payer: Medicare Other | Attending: Emergency Medicine | Admitting: Emergency Medicine

## 2018-01-08 ENCOUNTER — Emergency Department (HOSPITAL_COMMUNITY): Payer: Medicare Other

## 2018-01-08 ENCOUNTER — Encounter (HOSPITAL_COMMUNITY): Payer: Self-pay | Admitting: Emergency Medicine

## 2018-01-08 ENCOUNTER — Other Ambulatory Visit: Payer: Self-pay

## 2018-01-08 ENCOUNTER — Telehealth: Payer: Self-pay | Admitting: Gastroenterology

## 2018-01-08 DIAGNOSIS — Z96651 Presence of right artificial knee joint: Secondary | ICD-10-CM | POA: Diagnosis not present

## 2018-01-08 DIAGNOSIS — E039 Hypothyroidism, unspecified: Secondary | ICD-10-CM | POA: Diagnosis not present

## 2018-01-08 DIAGNOSIS — R1013 Epigastric pain: Secondary | ICD-10-CM | POA: Insufficient documentation

## 2018-01-08 DIAGNOSIS — Z79899 Other long term (current) drug therapy: Secondary | ICD-10-CM | POA: Insufficient documentation

## 2018-01-08 DIAGNOSIS — I10 Essential (primary) hypertension: Secondary | ICD-10-CM | POA: Diagnosis not present

## 2018-01-08 DIAGNOSIS — R1011 Right upper quadrant pain: Secondary | ICD-10-CM | POA: Insufficient documentation

## 2018-01-08 DIAGNOSIS — N200 Calculus of kidney: Secondary | ICD-10-CM | POA: Diagnosis not present

## 2018-01-08 DIAGNOSIS — R112 Nausea with vomiting, unspecified: Secondary | ICD-10-CM | POA: Diagnosis not present

## 2018-01-08 DIAGNOSIS — K8020 Calculus of gallbladder without cholecystitis without obstruction: Secondary | ICD-10-CM | POA: Diagnosis not present

## 2018-01-08 LAB — COMPLETE METABOLIC PANEL WITH GFR
AG RATIO: 1 (calc) (ref 1.0–2.5)
ALKALINE PHOSPHATASE (APISO): 169 U/L — AB (ref 33–130)
ALT: 352 U/L — ABNORMAL HIGH (ref 6–29)
AST: 368 U/L — AB (ref 10–35)
Albumin: 3.7 g/dL (ref 3.6–5.1)
BILIRUBIN TOTAL: 2.7 mg/dL — AB (ref 0.2–1.2)
BUN / CREAT RATIO: 15 (calc) (ref 6–22)
BUN: 14 mg/dL (ref 7–25)
CHLORIDE: 99 mmol/L (ref 98–110)
CO2: 29 mmol/L (ref 20–32)
Calcium: 9.6 mg/dL (ref 8.6–10.4)
Creat: 0.96 mg/dL — ABNORMAL HIGH (ref 0.60–0.93)
GFR, Est African American: 68 mL/min/{1.73_m2} (ref >=60)
GFR, Est Non African American: 59 mL/min/{1.73_m2} — ABNORMAL LOW (ref >=60)
GLOBULIN: 3.8 g/dL — AB (ref 1.9–3.7)
Glucose, Bld: 57 mg/dL — ABNORMAL LOW (ref 65–139)
POTASSIUM: 4.2 mmol/L (ref 3.5–5.3)
SODIUM: 136 mmol/L (ref 135–146)
Total Protein: 7.5 g/dL (ref 6.1–8.1)

## 2018-01-08 LAB — LACTIC ACID, PLASMA
LACTIC ACID, VENOUS: 1.2 mmol/L (ref 0.5–1.9)
Lactic Acid, Venous: 1.6 mmol/L (ref 0.5–1.9)

## 2018-01-08 LAB — CBC WITH DIFFERENTIAL/PLATELET
Basophils Absolute: 45 cells/uL (ref 0–200)
Basophils Relative: 0.5 %
EOS PCT: 0.7 %
Eosinophils Absolute: 62 cells/uL (ref 15–500)
HCT: 36.7 % (ref 35.0–45.0)
Hemoglobin: 12.4 g/dL (ref 11.7–15.5)
Lymphs Abs: 1691 cells/uL (ref 850–3900)
MCH: 30.6 pg (ref 27.0–33.0)
MCHC: 33.8 g/dL (ref 32.0–36.0)
MCV: 90.6 fL (ref 80.0–100.0)
MPV: 11.2 fL (ref 7.5–12.5)
Monocytes Relative: 13.1 %
NEUTROS PCT: 66.7 %
Neutro Abs: 5936 cells/uL (ref 1500–7800)
PLATELETS: 226 10*3/uL (ref 140–400)
RBC: 4.05 10*6/uL (ref 3.80–5.10)
RDW: 11.6 % (ref 11.0–15.0)
TOTAL LYMPHOCYTE: 19 %
WBC mixed population: 1166 cells/uL — ABNORMAL HIGH (ref 200–950)
WBC: 8.9 10*3/uL (ref 3.8–10.8)

## 2018-01-08 LAB — URINE CULTURE
MICRO NUMBER:: 90061469
Result:: NO GROWTH
SPECIMEN QUALITY: ADEQUATE

## 2018-01-08 LAB — URINALYSIS, ROUTINE W REFLEX MICROSCOPIC
BILIRUBIN URINE: NEGATIVE
Glucose, UA: NEGATIVE mg/dL
HGB URINE DIPSTICK: NEGATIVE
Ketones, ur: NEGATIVE mg/dL
Leukocytes, UA: NEGATIVE
Nitrite: NEGATIVE
PH: 6 (ref 5.0–8.0)
Protein, ur: NEGATIVE mg/dL
SPECIFIC GRAVITY, URINE: 1.04 — AB (ref 1.005–1.030)

## 2018-01-08 LAB — COMPREHENSIVE METABOLIC PANEL
ALT: 319 U/L — ABNORMAL HIGH (ref 14–54)
ANION GAP: 12 (ref 5–15)
AST: 263 U/L — ABNORMAL HIGH (ref 15–41)
Albumin: 3.1 g/dL — ABNORMAL LOW (ref 3.5–5.0)
Alkaline Phosphatase: 168 U/L — ABNORMAL HIGH (ref 38–126)
BUN: 12 mg/dL (ref 6–20)
CHLORIDE: 104 mmol/L (ref 101–111)
CO2: 21 mmol/L — AB (ref 22–32)
Calcium: 8.8 mg/dL — ABNORMAL LOW (ref 8.9–10.3)
Creatinine, Ser: 0.95 mg/dL (ref 0.44–1.00)
GFR calc non Af Amer: 58 mL/min — ABNORMAL LOW (ref >60)
GLUCOSE: 162 mg/dL — AB (ref 65–99)
Potassium: 4 mmol/L (ref 3.5–5.1)
SODIUM: 137 mmol/L (ref 135–145)
Total Bilirubin: 1.9 mg/dL — ABNORMAL HIGH (ref 0.3–1.2)
Total Protein: 7.7 g/dL (ref 6.5–8.1)

## 2018-01-08 LAB — PROTIME-INR
INR: 1.21
PROTHROMBIN TIME: 15.2 s (ref 11.4–15.2)

## 2018-01-08 LAB — CBC
HEMATOCRIT: 36.1 % (ref 36.0–46.0)
Hemoglobin: 12.4 g/dL (ref 12.0–15.0)
MCH: 31.7 pg (ref 26.0–34.0)
MCHC: 34.3 g/dL (ref 30.0–36.0)
MCV: 92.3 fL (ref 78.0–100.0)
Platelets: 194 10*3/uL (ref 150–400)
RBC: 3.91 MIL/uL (ref 3.87–5.11)
RDW: 13.1 % (ref 11.5–15.5)
WBC: 7.7 10*3/uL (ref 4.0–10.5)

## 2018-01-08 LAB — AMYLASE: AMYLASE: 61 U/L (ref 21–101)

## 2018-01-08 LAB — TROPONIN I: Troponin I: <0.03 ng/mL (ref <0.03)

## 2018-01-08 LAB — LIPASE, BLOOD: LIPASE: 68 U/L — AB (ref 11–51)

## 2018-01-08 LAB — LIPASE: Lipase: 106 U/L — ABNORMAL HIGH (ref 7–60)

## 2018-01-08 MED ORDER — IOPAMIDOL (ISOVUE-300) INJECTION 61%
INTRAVENOUS | Status: AC
  Administered 2018-01-08: 100 mL
  Filled 2018-01-08: qty 100

## 2018-01-08 MED ORDER — OXYCODONE HCL 5 MG PO TABS: 5.0000 mg | ORAL_TABLET | Freq: Four times a day (QID) | ORAL | 0 refills | Status: DC | PRN

## 2018-01-08 NOTE — Discharge Instructions (Addendum)
There was evidence on imaging that leads [place] to believe that your gallbladder may have been responsible for the pain episode you described. Your liver enzymes and lipase are improving. Unless you have been told otherwise, may take ibuprofen or naproxen for pain.  May take the oxycodone for severe pain.  Do not drive or perform other dangerous activities while taking the oxycodone.  Please use caution when taking the oxycodone as this can cause drowsiness or increase the chance of falls.   Please follow-up with the general surgeon and the gastroenterologist on this matter.  Return to the ED for fever with abdominal pain, persistent or worsening pain, persistent vomiting, or any other major concerns.

## 2018-01-08 NOTE — ED Triage Notes (Signed)
Pt in from home with c/o sharp central upper quad abdominal pain starting Monday, emesis x 1 that night. Hx of gallstones, saw doctor yesterday and they stated she had elevated liver enzymes this morning. Told to come to ED. Denies n/v/d, sob or dizziness.

## 2018-01-08 NOTE — ED Triage Notes (Signed)
PT absent from room

## 2018-01-08 NOTE — ED Provider Notes (Signed)
[place] EMERGENCY DEPARTMENT Provider Note   CSN: [id number] Arrival date & time: 01/08/18  1121     History   Chief Complaint Chief Complaint  Patient presents with  . Abdominal Pain    HPI Christina [name] is a 72 y.o. female.  HPI   Christina [name] is a 72 y.o. female, with a history of alcoholic cirrhosis, hypertension, and anemia, presenting to the ED with epigastric pain beginning 1/14.  Pain was severe, sharp, radiating around the upper abdomen to the midback "like a bear hug." This episode came on suddenly and lasted for approximately 3 hours. Accompanied by NB/NB vomiting.  She continues to have intermittent epigastric discomfort, however, states it is minor, rating it 1/10. On January 15, patient states she began to have dark urine.  Nausea continued yesterday, but has resolved today. Pain does not seem to be associated with eating, physical activity, or movement. She was seen by her PCP yesterday and labs were drawn.  Labs resulted today and showed elevated liver enzymes and elevated lipase.  She states she was hospitalized 3 years ago in [place] for ascites associated with her alcoholic hepatic cirrhosis.  She has not had any alcohol in 3 years.  Denies fever/chills, diarrhea, shortness of breath, chest pain, diaphoresis, abnormal stool color, dysuria, or any other complaints.  GI: Armbruster  Past Medical History:  Diagnosis Date  . Abnormal EKG   . Abnormal finding on thyroid function test   . Abnormal liver function test   . Acute edema   . Alcoholic cirrhosis ([place]) 40/98/1191   Possible NASH overlap (MELD 13)  . Anemia   . Anxiety   . Arthritis   . Ascites   . Back pain   . Breast mass   . Chronic low back pain   . Complete right rotator cuff tear   . Compression fracture of L4 lumbar vertebra (HCC)   . Dermatitis, eczematoid   . Difficulty breathing   . Gallstones   . Hepatitis   . History of adenocarcinoma of breast   .  Hypercholesterolemia   . Hyperkalemia   . Hypertension   . Hypokalemia   . Hypothyroidism   . Insomnia   . Knee pain   . Leukocytosis   . Lymphadenopathy   . Macrocytosis   . Memory loss or impairment   . Menopause   . MGUS (monoclonal gammopathy of unknown significance)   . Osteoarthritis   . Osteoarthritis of right knee   . Other cirrhosis of liver ([place])   . Renal insufficiency syndrome   . Right knee DJD   . Solitary thyroid nodule   . Unspecified lump in the left breast, unspecified quadrant   . Vitamin B 12 deficiency   . Waldenstrom macroglobulinemia [facility])     Patient Active Problem List   Diagnosis Date Noted  . Closed compression fracture of L4 lumbar vertebra ([place]) 09/09/2017  . Psychophysiological insomnia 09/09/2017  . Seborrheic dermatitis of scalp 09/09/2017  . Depression, major, single episode, mild ([place]) 09/09/2017  . Alcoholic cirrhosis of liver with ascites ([place]) 09/09/2017  . History of breast cancer 09/09/2017  . B12 deficiency 09/09/2017  . Postoperative hypothyroidism 09/09/2017  . 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 Waldenstrom's macroglobulinemia ([place]) 09/09/2017  . Hyperlipidemia 09/09/2017    Past Surgical History:  Procedure Laterality Date  . BREAST SURGERY  2014   Removed lymphnodes  . LESION REMOVAL  09/2015   tubular adenoma-4 subcentimeter  lesions  . TOTAL KNEE ARTHROPLASTY Right 09/05/2016    OB History    No data available       Home Medications    Prior to Admission medications   Medication Sig Start Date End Date Taking? Authorizing Provider  buPROPion (WELLBUTRIN XL) 150 MG 24 hr tablet TAKE ONE TABLET BY MOUTH DAILY Patient taking differently: TAKE 150 mg TABLET BY MOUTH DAILY 12/18/17  Yes Reed, Tiffany L, DO  Cholecalciferol (VITAMIN D3) 2000 units TABS Take 1 tablet by mouth every morning.   Yes [provider]  citalopram (CELEXA) 40 MG tablet Take 1 tablet (40 mg total) by mouth  daily. 12/30/17  Yes Reed, Tiffany L, DO  furosemide (LASIX) 20 MG tablet Take 1 tablet (20 mg total) by mouth every morning. 12/30/17  Yes Reed, Tiffany L, DO  ibuprofen (ADVIL,MOTRIN) 200 MG tablet Take 400 mg by mouth daily as needed.   Yes [provider]  ketoconazole (NIZORAL) 2 % shampoo Apply 1 application topically 2 (two) times a week. 12/30/17  Yes Reed, Tiffany L, DO  LACTULOSE PO Take 15 mLs by mouth every morning.   Yes [provider]  levothyroxine (SYNTHROID, LEVOTHROID) 88 MCG tablet TAKE 1 TABELT BY MOUTH ONCE A  DAY 30 MINUTES BEFORE BREAKFAST AND BEFORE ANY OTHER MEDICATIONS Patient taking differently: TAKE 88 mg TABELT BY MOUTH ONCE A  DAY 30 MINUTES BEFORE BREAKFAST AND BEFORE ANY OTHER MEDICATIONS 10/18/17  Yes Reed, Tiffany L, DO  Melatonin 10 MG TABS Take one tablet by mouth once daily at bedtime for rest Patient taking differently: Take 10 mg by mouth at bedtime. Take one tablet by mouth once daily at bedtime for rest 06/20/17  Yes Reed, Tiffany L, DO  Multiple Vitamin (MULTIVITAMIN) tablet Take 1 tablet by mouth daily.   Yes [provider]  potassium chloride (K-DUR) 10 MEQ tablet Take 1 tablet (10 mEq total) by mouth daily. 11/18/17  Yes Reed, Tiffany L, DO  propranolol (INDERAL) 20 MG tablet Take 1 tablet (20 mg total) by mouth every morning. 05/17/17  Yes Reed, Tiffany L, DO  spironolactone (ALDACTONE) 100 MG tablet Take 100 mg by mouth every morning.   Yes [provider]  traMADol (ULTRAM) 50 MG tablet Take 25 mg by mouth daily as needed.   Yes [provider]  vitamin B-12 (CYANOCOBALAMIN) 1000 MCG tablet Take 1,000 mcg by mouth every morning.   Yes [provider]  oxyCODONE (ROXICODONE) 5 MG immediate release tablet Take 1 tablet (5 mg total) by mouth every 6 (six) hours as needed for severe pain. 01/08/18   Esmirna Ravan, Helane Gunther, PA-C    Family History Family History  Problem Relation Age of Onset  . Breast cancer Mother     . Colon cancer Neg Hx   . Stomach cancer Neg Hx   . Rectal cancer Neg Hx   . Liver cancer Neg Hx   . Esophageal cancer Neg Hx     Social History Social History   Tobacco Use  . Smoking status: Never Smoker  . Smokeless tobacco: Never Used  Substance Use Topics  . Alcohol use: No    Comment: 6 or more per day in the past.   . Drug use: No     Allergies   Patient has no known allergies.   Review of Systems Review of Systems  Constitutional: Negative for chills, diaphoresis and fever.  Respiratory: Negative for cough and shortness of breath.   Cardiovascular: Negative  for chest pain.  Gastrointestinal: Positive for abdominal pain, nausea and vomiting. Negative for blood in stool and diarrhea.  Genitourinary: Negative for dysuria, frequency and hematuria.  Neurological: Negative for dizziness and light-headedness.  All other systems reviewed and are negative.    Physical Exam Updated Vital Signs BP 123/76 (BP Location: Right Arm)   Pulse 65   Temp 98.3 F (36.8 C) (Oral)   Ht 4' 9"  (1.448 m)   Wt 81.2 kg (179 lb)   SpO2 98%   BMI 38.74 kg/m   Physical Exam  Constitutional: She appears well-developed and well-nourished. No distress.  HENT:  Head: Normocephalic and atraumatic.  Eyes: Conjunctivae are normal.  Neck: Neck supple.  Cardiovascular: Normal rate, regular rhythm, normal heart sounds and intact distal pulses.  Pulmonary/Chest: Effort normal and breath sounds normal. No respiratory distress.  Abdominal: Soft. There is tenderness (minor) in the right upper quadrant and epigastric area. There is no guarding.  The abdominal pain was only indicated verbally by the patient.  She had no further reaction. Subsequent abdominal exams had no reaction whatsoever.  Musculoskeletal: She exhibits no edema.  Lymphadenopathy:    She has no cervical adenopathy.  Neurological: She is alert.  Skin: Skin is warm and dry. Capillary refill takes less than 2 seconds. She  is not diaphoretic.  Psychiatric: She has a normal mood and affect. Her behavior is normal.  Nursing note and vitals reviewed.    ED Treatments / Results  Labs (all labs ordered are listed, but only abnormal results are displayed) Labs Reviewed  LIPASE, BLOOD - Abnormal; Notable for the following components:      Result Value   Lipase 68 (*)    All other components within normal limits  COMPREHENSIVE METABOLIC PANEL - Abnormal; Notable for the following components:   CO2 21 (*)    Glucose, Bld 162 (*)    Calcium 8.8 (*)    Albumin 3.1 (*)    AST 263 (*)    ALT 319 (*)    Alkaline Phosphatase 168 (*)    Total Bilirubin 1.9 (*)    GFR calc non Af Amer 58 (*)    All other components within normal limits  URINALYSIS, ROUTINE W REFLEX MICROSCOPIC - Abnormal; Notable for the following components:   Specific Gravity, Urine 1.040 (*)    All other components within normal limits  CBC  PROTIME-INR  TROPONIN I  LACTIC ACID, PLASMA  LACTIC ACID, PLASMA    ALT  Date Value Ref Range Status  01/08/2018 319 (H) 14 - 54 U/L Final  01/07/2018 352 (H) 6 - 29 U/L Final  12/30/2017 14 6 - 29 U/L Final  09/09/2017 17 6 - 29 U/L Final    AST  Date Value Ref Range Status  01/08/2018 263 (H) 15 - 41 U/L Final  01/07/2018 368 (H) 10 - 35 U/L Final  12/30/2017 22 10 - 35 U/L Final  09/09/2017 22 10 - 35 U/L Final    EKG  EKG Interpretation None       Radiology Ct Abdomen Pelvis W Contrast  Result Date: 01/08/2018 CLINICAL DATA:  Generalized abdominal pain. EXAM: CT ABDOMEN AND PELVIS WITH CONTRAST TECHNIQUE: Multidetector CT imaging of the abdomen and pelvis was performed using the standard protocol following bolus administration of intravenous contrast. CONTRAST:  13m ISOVUE-300 IOPAMIDOL (ISOVUE-300) INJECTION 61% COMPARISON:  None. FINDINGS: Lower chest: No acute abnormality. Hepatobiliary: Nodular hepatic contours are noted consistent with hepatic cirrhosis. Mild  cholelithiasis is  noted. No focal hepatic abnormality is noted. Pancreas: Unremarkable. No pancreatic ductal dilatation or surrounding inflammatory changes. Spleen: Normal in size without focal abnormality. Adrenals/Urinary Tract: Small nonobstructive right renal calculus is noted. No hydronephrosis or renal obstruction is noted. Adrenal glands are unremarkable. Urinary bladder is unremarkable. Stomach/Bowel: Stomach is within normal limits. Appendix appears normal. No evidence of bowel wall thickening, distention, or inflammatory changes. Vascular/Lymphatic: Aortic atherosclerosis. No enlarged abdominal or pelvic lymph nodes. Enlarged collateral veins are seen in the splenic hilum suggesting portal hypertension. Reproductive: Uterus and bilateral adnexa are unremarkable. Other: No abnormal fluid collection is noted. Moderate size fat containing periumbilical hernia is noted. Musculoskeletal: No acute or significant osseous findings. IMPRESSION: Findings consistent with hepatic cirrhosis with collateral veins suggesting portal hypertension. Mild cholelithiasis. Small nonobstructive right renal calculus. No hydronephrosis or renal obstruction is noted. Aortic atherosclerosis. Moderate size fat containing periumbilical hernia. Electronically Signed   By: Marijo Conception, M.D.   On: 01/08/2018 13:46   US Abdomen Limited Ruq  Result Date: 01/08/2018 CLINICAL DATA:  Right upper quadrant pain EXAM: ULTRASOUND ABDOMEN LIMITED RIGHT UPPER QUADRANT COMPARISON:  CT 01/08/2018, ultrasound 06/15/2017 FINDINGS: Gallbladder: Multiple small gallstones and gallbladder sludge. Gallbladder well distended. Negative for gallbladder wall thickening. Negative sonographic Murphy sign. Common bile duct: Diameter: 3 mm Liver: Suboptimal imaging of the liver. Cirrhosis of liver based on prior CT. No liver mass or ascites. Portal vein is patent on color Doppler imaging with normal direction of blood flow towards the liver. IMPRESSION:  Gallstones in gallbladder sludge. Negative for gallbladder wall thickening or sonographic Murphy sign. No biliary dilatation Liver cirrhosis. Electronically Signed   By: Franchot Gallo M.D.   On: 01/08/2018 15:45    Procedures Procedures (including critical care time)  Medications Ordered in ED Medications  iopamidol (ISOVUE-300) 61 % injection (100 mLs  Contrast Given 01/08/18 1324)     Initial Impression / Assessment and Plan / ED Course  I have reviewed the triage vital signs and the nursing notes.  Pertinent labs & imaging results that were available during my care of the patient were reviewed by me and considered in my medical decision making (see chart for details).     Patient presents following a past episode of abdominal pain.  She only came to the ED because lab tests at her PCPs office showed elevated LFTs and lipase. Patient is nontoxic appearing, afebrile, not tachycardic, not tachypneic, maintains excellent SPO2 on room air, and is in no apparent distress.  Patient shows improvement in her LFTs and lipase.  Imaging results give evidence to show that patient's episode of pain 2 days ago may have been from biliary colic.  General surgery and GI follow-up. The patient was given instructions for home care as well as return precautions. Patient voices understanding of these instructions, accepts the plan, and is comfortable with discharge.  Findings and plan of care discussed with Raylene Miyamoto, MD. Dr. Zenia Resides personally evaluated and examined this patient.  Vitals:   01/08/18 1315 01/08/18 1545 01/08/18 1630 01/08/18 1645  BP: (!) 95/57 97/62 96/64  (!) 97/59  Pulse: 60 (!) 59 (!) 58 (!) 55  Resp: 20 15 20 18   Temp:      TempSrc:      SpO2: 97% 98% 96% 95%  Weight:      Height:         Final Clinical Impressions(s) / ED Diagnoses   Final diagnoses:  Epigastric pain  RUQ pain    ED Discharge  Orders        Ordered    oxyCODONE (ROXICODONE) 5 MG immediate release  tablet  Every 6 hours PRN     01/08/18 1651       Lorayne Bender, PA-C 01/08/18 2141

## 2018-01-08 NOTE — Telephone Encounter (Signed)
Records received from prior colonoscopy.  Done on 10/13/2015 - Dr. Adolph Pollack at [facility]. - exam was a poor prep which was aborted in the left colon, told to repeat with 2 day prep.    Christina [name] can you confirm this with the patient or her son? She was supposed to have a repeat colonoscopy with 2 day prep in 2016, but don't see that she ever had it. If she has not, she would be due for a colonoscopy now. Can you see if she should want to do this, with a 2 day prep? Thanks

## 2018-01-08 NOTE — ED Provider Notes (Signed)
Medical screening examination/treatment/procedure(s) were conducted as a shared visit with non-physician practitioner(s) and myself.  I personally evaluated the patient during the encounter.   EKG Interpretation None     pt here with right upper quadrant abd pain with elevated liver enzymes abd [place] with gb sludge Suspect biliary colic Follow up given   Lacretia Leigh, MD 01/08/18 1611

## 2018-01-09 ENCOUNTER — Telehealth: Payer: Self-pay | Admitting: Gastroenterology

## 2018-01-09 NOTE — Telephone Encounter (Signed)
Left message for patient to call back, also have some lab results I need to give to her.

## 2018-01-09 NOTE — Telephone Encounter (Signed)
Christina [name], can you please call this patient to follow up on her?  I got a note from the ED that she was seen yesterday with abdominal pain, was improving in the ER. Her LFTs bumped up 2 days ago and had downtrended yesterday. [place] repeated and showed gallstones but normal biliary tree, and CT showed normal biliary tree. I was not aware of this occurrence until today. It sounds like she likely passed a gallstone.   Can you see if she is feeling better? If so, she should continue to monitor, and would repeat LFTs Monday to ensure they are downtrending / normalizing. If she still has pain or has recurrence of symptoms please let me know.   In looking through the chart, I think the ER doc referred her to see general surgery to discuss cholecystectomy as outpatient, can you confirm with them? She is higher than average risk for cholecystectomy given her cirrhosis but she may need it given this occurrence and risk for recurrence.

## 2018-01-10 ENCOUNTER — Other Ambulatory Visit: Payer: Self-pay

## 2018-01-10 ENCOUNTER — Telehealth: Payer: Self-pay

## 2018-01-10 DIAGNOSIS — R7989 Other specified abnormal findings of blood chemistry: Secondary | ICD-10-CM

## 2018-01-10 DIAGNOSIS — R945 Abnormal results of liver function studies: Secondary | ICD-10-CM

## 2018-01-10 DIAGNOSIS — K7031 Alcoholic cirrhosis of liver with ascites: Secondary | ICD-10-CM

## 2018-01-10 NOTE — Telephone Encounter (Signed)
Patient's daughter called back, patient is feeling better and they will contact [facility] to schedule a consult to discuss cholecystectomy. Patient will come in to get labs repeated on Monday and also her first hep A and B vaccine. Patient's daughter will check on the 2016 colonoscopy if that was completed or not. If it was not, will discuss having this done/scheduled and understands that she will need a 2 day prep.

## 2018-01-10 NOTE — Telephone Encounter (Signed)
Left two messages for patient to contact our office, have several issues to discuss with her and see how she is doing. I have placed an order in the lab, did lvm for patient to go have repeat LFT's.  I have mailed a letter with lab results, follow up on old GI records and recommendations, also trying to follow up on recent ED visit. Asked again, to have patient or family to contact our office.

## 2018-01-10 NOTE — Telephone Encounter (Signed)
See other phone and result notes.

## 2018-01-13 ENCOUNTER — Other Ambulatory Visit (INDEPENDENT_AMBULATORY_CARE_PROVIDER_SITE_OTHER): Payer: Medicare Other

## 2018-01-13 ENCOUNTER — Ambulatory Visit (INDEPENDENT_AMBULATORY_CARE_PROVIDER_SITE_OTHER): Payer: Medicare Other | Admitting: Gastroenterology

## 2018-01-13 DIAGNOSIS — Z23 Encounter for immunization: Secondary | ICD-10-CM

## 2018-01-13 DIAGNOSIS — R945 Abnormal results of liver function studies: Secondary | ICD-10-CM

## 2018-01-13 DIAGNOSIS — K7031 Alcoholic cirrhosis of liver with ascites: Secondary | ICD-10-CM | POA: Diagnosis not present

## 2018-01-13 DIAGNOSIS — R7989 Other specified abnormal findings of blood chemistry: Secondary | ICD-10-CM

## 2018-01-13 DIAGNOSIS — F4321 Adjustment disorder with depressed mood: Secondary | ICD-10-CM | POA: Diagnosis not present

## 2018-01-13 LAB — HEPATIC FUNCTION PANEL
ALK PHOS: 136 U/L — AB (ref 39–117)
ALT: 98 U/L — ABNORMAL HIGH (ref 0–35)
AST: 50 U/L — AB (ref 0–37)
Albumin: 3.6 g/dL (ref 3.5–5.2)
BILIRUBIN DIRECT: 0.4 mg/dL — AB (ref 0.0–0.3)
BILIRUBIN TOTAL: 1 mg/dL (ref 0.2–1.2)
Total Protein: 7.5 g/dL (ref 6.0–8.3)

## 2018-01-14 ENCOUNTER — Other Ambulatory Visit: Payer: Self-pay

## 2018-01-14 DIAGNOSIS — R7989 Other specified abnormal findings of blood chemistry: Secondary | ICD-10-CM

## 2018-01-14 DIAGNOSIS — R945 Abnormal results of liver function studies: Principal | ICD-10-CM

## 2018-01-21 ENCOUNTER — Ambulatory Visit: Payer: Self-pay | Admitting: Surgery

## 2018-01-21 ENCOUNTER — Telehealth: Payer: Self-pay | Admitting: *Deleted

## 2018-01-21 DIAGNOSIS — R4702 Dysphasia: Secondary | ICD-10-CM | POA: Diagnosis not present

## 2018-01-21 DIAGNOSIS — Z86010 Personal history of colonic polyps: Secondary | ICD-10-CM | POA: Diagnosis not present

## 2018-01-21 DIAGNOSIS — K8010 Calculus of gallbladder with chronic cholecystitis without obstruction: Secondary | ICD-10-CM | POA: Diagnosis not present

## 2018-01-21 DIAGNOSIS — K7469 Other cirrhosis of liver: Secondary | ICD-10-CM | POA: Diagnosis not present

## 2018-01-21 NOTE — Telephone Encounter (Signed)
Patient daughter, Jackelyn Poling called and stated that patient went to [facility] today and saw Dr. Johney Maine. Stated that he is waiting for OV notes and Surgery Clearance. Stated that they were sent to the ER on the 16th due to her labs abnormal. Wanting to know if you can send the medical clearance and OV notes to his office or if you have to see her first.   Also Stated that they saw Dr. Havery Moros and her Liver enzymes are still falling. Does have a follow up with him.   Please Advise.

## 2018-01-21 NOTE — Telephone Encounter (Signed)
I just read everything to catch up on this.  It appears that both Dr. Havery Moros and Dr. Johney Maine are recommending cholecystectomy to prevent a recurrence of her severe pain she had in the ED.  Unfortunately, she has not had an EKG since coming here and there was no EKG in the ED (just cardiac enzymes).  I really need to have that before I feel comfortable sending her for surgery.  I did review her echo from 2016 which shows just grade 1 diastolic dysfunction and a normal pump function.    Please get her on the schedule for medical clearance next available with an EKG ASAP.

## 2018-01-21 NOTE — Telephone Encounter (Signed)
Patient daughter, Jackelyn Poling notified and agreed. Appointment scheduled for tomorrow with Janett Billow.

## 2018-01-22 ENCOUNTER — Encounter: Payer: Self-pay | Admitting: Nurse Practitioner

## 2018-01-22 ENCOUNTER — Ambulatory Visit (INDEPENDENT_AMBULATORY_CARE_PROVIDER_SITE_OTHER): Payer: Medicare Other | Admitting: Nurse Practitioner

## 2018-01-22 VITALS — BP 122/78 | HR 70 | Temp 98.0°F | Ht <= 58 in | Wt 181.0 lb

## 2018-01-22 DIAGNOSIS — Z01818 Encounter for other preprocedural examination: Secondary | ICD-10-CM | POA: Diagnosis not present

## 2018-01-22 DIAGNOSIS — K8010 Calculus of gallbladder with chronic cholecystitis without obstruction: Secondary | ICD-10-CM | POA: Diagnosis not present

## 2018-01-22 NOTE — Progress Notes (Signed)
Careteam: Patient Care Team: Gayland Curry, DO as PCP - General (Geriatric Medicine) Armbruster, [name], MD as Consulting Physician (Gastroenterology) [name], MD as Consulting Physician (General Surgery)  No Known Allergies  Chief Complaint  Patient presents with  . Acute Visit    Pt is being seen to obtain medical clearance for surgery. Pt is to have cholecystectomy ASAP.   . forms    A fax was received from [facility] with clearance requirements     HPI: Patient is a 72 y.o. female seen in the office today for surgical evaluation and EKG.  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  Apparently both Dr. Havery Moros and Dr. Johney Maine are recommending cholecystectomy to prevent a recurrence of her severe pain she had in the ED.  General surgery wants to do lap chol under general anesthesia.  Hx of abnormal EKG- Reports she did nuclear stress test over 10 years ago; reports this is from a murmur her physician thought she had in the past but states this came back "Normal" Has never had chest pains.  Echo done in 2016 prior to knee surgery in 2017- which revealed EF 55-60% and grade 1 dystolic dysfunction  had bilateral knee surgery, last was right knee under general anesthesia in [place] around 3614 without complicates.   No hx of smoking, no shortness of breath no cough or congestion.  No dizziness or lightheaded normally, will occasionally be lightheaded if she gets up too fast.   No more abdominal pain, slight discomfort to epigastric area- no further pain.   Dr Johney Maine requesting office notes and labs from 1/15 and office note from today. Has gotten labs from GI.   Review of Systems:  Review of Systems  Constitutional: Negative for chills, fever and malaise/fatigue.  Eyes: Negative for blurred vision.    Respiratory: Negative for cough and shortness of breath.   Cardiovascular: Negative for chest pain, palpitations and leg swelling.  Gastrointestinal: Positive for abdominal pain (improved). Negative for blood in stool, constipation, diarrhea, heartburn, melena, nausea and vomiting.       Takes am lactulose for regular BMs  Genitourinary: Negative for dysuria, frequency and urgency.  Musculoskeletal: Negative for falls.       Unsteady gait, uses cane or walker--does better with walker- not using assistive device today  Neurological: Negative for dizziness and weakness.  Psychiatric/Behavioral: Positive for depression and memory loss. The patient has insomnia. The patient is not nervous/anxious.     Past Medical History:  Diagnosis Date  . Abnormal EKG   . Abnormal finding on thyroid function test   . Abnormal liver function test   . Acute edema   . Alcoholic cirrhosis (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 Anemia   . Anxiety   . Arthritis   . Ascites   . Back pain   . Breast mass   . Chronic low back pain   . Complete right rotator cuff tear   . Compression fracture of L4 lumbar vertebra ([facility])   . Dermatitis, eczematoid   . Difficulty breathing   . Gallstones   . Hepatitis   . History of adenocarcinoma of breast   . Hypercholesterolemia   . Hyperkalemia   . Hypertension   . Hypokalemia   . Hypothyroidism   . Insomnia   . Knee pain   .  Leukocytosis   . Lymphadenopathy   . Macrocytosis   . Memory loss or impairment   . Menopause   . MGUS (monoclonal gammopathy of unknown significance)   . Osteoarthritis   . Osteoarthritis of right knee   . Other cirrhosis of liver ([place])   . Renal insufficiency syndrome   . Right knee DJD   . Solitary thyroid nodule   . Unspecified lump in the left breast, unspecified quadrant   . Vitamin B 12 deficiency   . 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[place])    Past Surgical History:  Procedure Laterality Date  . BREAST SURGERY   2014   Removed lymphnodes  . LESION REMOVAL  09/2015   tubular adenoma-4 subcentimeter lesions  . TOTAL KNEE ARTHROPLASTY Right 09/05/2016   Social History:   reports that  has never smoked. she has never used smokeless tobacco. She reports that she does not drink alcohol or use drugs.  Family History  Problem Relation Age of Onset  . Breast cancer Mother   . Colon cancer Neg Hx   . Stomach cancer Neg Hx   . Rectal cancer Neg Hx   . Liver cancer Neg Hx   . Esophageal cancer Neg Hx     Medications: Patient's Medications  New Prescriptions   No medications on file  Previous Medications   BUPROPION (WELLBUTRIN XL) 150 MG 24 HR TABLET    TAKE ONE TABLET BY MOUTH DAILY   CHOLECALCIFEROL (VITAMIN D3) 2000 UNITS TABS    Take 1 tablet by mouth every morning.   CITALOPRAM (CELEXA) 40 MG TABLET    Take 1 tablet (40 mg total) by mouth daily.   FUROSEMIDE (LASIX) 20 MG TABLET    Take 1 tablet (20 mg total) by mouth every morning.   IBUPROFEN (ADVIL,MOTRIN) 200 MG TABLET    Take 400 mg by mouth daily as needed.   KETOCONAZOLE (NIZORAL) 2 % SHAMPOO    Apply 1 application topically 2 (two) times a week.   LACTULOSE PO    Take 15 mLs by mouth every morning.   LEVOTHYROXINE (SYNTHROID, LEVOTHROID) 88 MCG TABLET    TAKE 1 TABELT BY MOUTH ONCE A  DAY 30 MINUTES BEFORE BREAKFAST AND BEFORE ANY OTHER MEDICATIONS   MELATONIN 10 MG TABS    Take one tablet by mouth once daily at bedtime for rest   MULTIPLE VITAMIN (MULTIVITAMIN) TABLET    Take 1 tablet by mouth daily.   OXYCODONE (ROXICODONE) 5 MG IMMEDIATE RELEASE TABLET    Take 1 tablet (5 mg total) by mouth every 6 (six) hours as needed for severe pain.   POTASSIUM CHLORIDE (K-DUR) 10 MEQ TABLET    Take 1 tablet (10 mEq total) by mouth daily.   PROPRANOLOL (INDERAL) 20 MG TABLET    Take 1 tablet (20 mg total) by mouth every morning.   SPIRONOLACTONE (ALDACTONE) 100 MG TABLET    Take 100 mg by mouth every morning.   TRAMADOL (ULTRAM) 50 MG TABLET     Take 25 mg by mouth daily as needed.   VITAMIN B-12 (CYANOCOBALAMIN) 1000 MCG TABLET    Take 1,000 mcg by mouth every morning.  Modified Medications   No medications on file  Discontinued Medications   No medications on file     Physical Exam:  Vitals:   01/22/18 1339  BP: 122/78  Pulse: 70  Temp: 98 F (36.7 C)  TempSrc: Oral  SpO2: 95%  Weight: 181 lb (82.1 kg)  Height: 4' 9"  (1.448  m)   Body mass index is 39.17 kg/m.  Physical Exam  Constitutional: She is oriented to person, place, and time. She appears well-developed and well-nourished. No distress.  Chronically ill appearing  HENT:  Head: Normocephalic and atraumatic.  Nose: Nose normal.  Mouth/Throat: Oropharynx is clear and moist. No oropharyngeal exudate.  Eyes: EOM are normal. Pupils are equal, round, and reactive to light.  Neck: Normal range of motion. Neck supple.  Cardiovascular: Normal rate, regular rhythm and normal heart sounds.  Pulmonary/Chest: Effort normal and breath sounds normal.  Abdominal: Soft. Bowel sounds are normal. She exhibits no distension and no mass. There is no tenderness. There is no rebound and no guarding. No hernia.  Neurological: She is alert and oriented to person, place, and time.  Skin: Skin is warm and dry. There is pallor.  Psychiatric: She has a normal mood and affect.    Labs reviewed: Basic Metabolic Panel: Recent Labs    05/17/17 1033  09/09/17 0403 12/30/17 1613 01/07/18 1602 01/08/18 1150  NA  --   --  136 135 136 137  K  --   --  4.2 4.4 4.2 4.0  CL  --   --  99 99 99 104  CO2  --   --  30 28 29  21*  GLUCOSE  --   --  91 90 57* 162*  BUN  --   --  20 14 14 12   CREATININE  --    < > 1.09* 0.87 0.96* 0.95  CALCIUM  --   --  9.5 9.7 9.6 8.8*  TSH 4.16  --  5.93* 0.79  --   --    < > = values in this interval not displayed.   Liver Function Tests: Recent Labs    05/06/17 1604  01/07/18 1602 01/08/18 1150 01/13/18 1357  AST 30   < > 368* 263* 50*  ALT  26   < > 352* 319* 98*  ALKPHOS 90  --   --  168* 136*  BILITOT 0.7   < > 2.7* 1.9* 1.0  PROT 8.1   < > 7.5 7.7 7.5  ALBUMIN 3.8  --   --  3.1* 3.6   < > = values in this interval not displayed.   Recent Labs    01/07/18 1602 01/08/18 1150  LIPASE 106* 68*  AMYLASE 61  --    Recent Labs    09/09/17 0403  AMMONIA 72   CBC: Recent Labs    09/09/17 0403 12/30/17 1613 01/07/18 1602 01/08/18 1150  WBC 9.5 8.8 8.9 7.7  NEUTROABS 5,349 5,544 5,936  --   HGB 13.4 12.6 12.4 12.4  HCT 39.4 37.2 36.7 36.1  MCV 88.3 90.5 90.6 92.3  PLT 264 215 226 194   Lipid Panel: Recent Labs    05/17/17 1033  CHOL 190  HDL 74  LDLCALC 100*  TRIG 81  CHOLHDL 2.6   TSH: Recent Labs    05/17/17 1033 09/09/17 0403 12/30/17 1613  TSH 4.16 5.93* 0.79   A1C: No results found for: HGBA1C   Assessment/Plan 1. Visit for pre-operative examination -no active symptoms today. Pt with hx of abnormal EKG, reviewed EKG and with nonspecific changes in lead III and AVF and poor R wave progression, inverted T wave in V1 and V2 but improved from previous EKG which showed inverted T wave in V1-V4. Dr Linna Darner was available for consultation on EKG.  No chest pains or shortness of breath. Spoke  with Dr Mariea Clonts pts PCP and there is nothing to stop proceeding with surgery based on findings with today's visit. -pt getting serial lab work at GI office due to elevated liver enzymes.   2. Chronic cholecystitis with calculus -causing bilary colic with elevated liver enzyme. Surgery feels like she would benefit from cholecystectomy to prevent recurrent episode and pain. Thought is to do it before another exacerbation and worsening of symptoms occurs.    Carlos American. Harle Battiest  [facility] [phone] 8 am - 5 pm) [phone] (after hours)

## 2018-01-27 ENCOUNTER — Other Ambulatory Visit (INDEPENDENT_AMBULATORY_CARE_PROVIDER_SITE_OTHER): Payer: Medicare Other

## 2018-01-27 DIAGNOSIS — F4321 Adjustment disorder with depressed mood: Secondary | ICD-10-CM | POA: Diagnosis not present

## 2018-01-27 DIAGNOSIS — R7989 Other specified abnormal findings of blood chemistry: Secondary | ICD-10-CM

## 2018-01-27 DIAGNOSIS — R945 Abnormal results of liver function studies: Secondary | ICD-10-CM

## 2018-01-27 LAB — HEPATIC FUNCTION PANEL
ALT: 19 U/L (ref 0–35)
AST: 21 U/L (ref 0–37)
Albumin: 3.5 g/dL (ref 3.5–5.2)
Alkaline Phosphatase: 85 U/L (ref 39–117)
BILIRUBIN TOTAL: 0.5 mg/dL (ref 0.2–1.2)
Bilirubin, Direct: 0.2 mg/dL (ref 0.0–0.3)
TOTAL PROTEIN: 7.7 g/dL (ref 6.0–8.3)

## 2018-01-30 NOTE — Progress Notes (Signed)
Please place orders in Epic as patient is being scheduled for a pre-op appointment! Thank you!

## 2018-01-31 ENCOUNTER — Ambulatory Visit: Payer: Self-pay | Admitting: Surgery

## 2018-01-31 NOTE — H&P (Signed)
Christina [name]  DOB: [birth year] Widowed / Language: Cleophus Molt / Race: White Female  Patient Care Team: Gayland Curry, DO as PCP - General (Geriatric Medicine) Armbruster, [name], MD as Consulting Physician (Gastroenterology) [name], MD as Consulting Physician (General Surgery)     Marland Kitchen ` Patient sent for surgical consultation at the request of Dr. Lacretia Leigh, [facility] health emergency Department.  Chief Complaint:  The patient is an obese elderly woman recently relocated from [place] Spring 2018 to live with her family. Son and daughter been helping try and get her health under better. He has a history of cirrhosis. Apparently was rather sick but was tuned up and has not had any worsening flares in the past few years. She had EGD a few years ago showing some varices. Or hernia. She was having some complaints of worsening dysphagia to solids. Saw gastroenterology. They were discussing about doing upper endoscopy versus symptomatic management. Has a history of colon polyps. Had colonoscopy attempted in 2016 done in [place]. Apparently was a poor prep and repeat was recommended. That had not happened yet. Patient usually moves her bowels 2 or 3 times a day without with the lactulose.  Patient had episode of severe epigastric pain all day after having below the night before. She had eggs in the morning and made it worse. Felt nauseated and threw up. Persistent symptoms. Went to primary care office. Apparently laboratory values were abnormal. Recommended go to emergency room. Found to have a bilirubin of 2.7. Normally it is been about 1. Ultrasound showed gallstones but no ductal dilatation. They suspect she was passing a common bile duct stone. Surgical consultation recommended.  She followed up with gastroenterology. They suspect that she may benefit from cholecystectomy and agreed with surgical consultation at some point. Considering upper and lower  endoscopy in the future.  Patient comes today with her daughter. She had a tubal ligation but no other abdominal surgery. She gets short of breath after walking a block or 2. She does not smoke. She denies ever having any heart attack or stroke or lung problems. She has had her right shoulder frozens. She does walk with a cane. No personal nor family history of GI/colon cancer, inflammatory bowel disease, irritable bowel syndrome, allergy such as Celiac Sprue, dietary/dairy problems, colitis, ulcers nor gastritis. No recent sick contacts/gastroenteritis. No travel outside the country. No changes in diet. No dysphagia to solids or liquids. No significant heartburn or reflux. No hematochezia, hematemesis, coffee ground emesis. No evidence of prior gastric/peptic ulceration.  (Review of systems as stated in this history (HPI) or in the review of systems. Otherwise all other 12 point ROS are negative)   Past Surgical History Chelsy Parrales, [place]; 01/21/2018 11:17 AM) Knee Surgery  Bilateral.  Diagnostic Studies History Alean Rinne, RMA; 01/21/2018 11:17 AM) Colonoscopy  1-5 years ago Mammogram  within last year  Allergies Alean Rinne, RMA; 01/21/2018 11:18 AM) No Known Drug Allergies [01/21/2018]: Allergies Reconciled   Medication History Esti Demello, [place]; 01/21/2018 11:20 AM) TraMADol HCl (50MG Tablet, Oral) Active. BuPROPion HCl ER (XL) (150MG Tablet ER 24HR, Oral) Active. Citalopram Hydrobromide (40MG Tablet, Oral) Active. Furosemide (20MG Tablet, Oral) Active. Ketoconazole (2% Shampoo, External) Active. Levothyroxine Sodium (88MCG Tablet, Oral) Active. Spironolactone (100MG Tablet, Oral) Active. Lactulose (10GM/15ML Solution, Oral) Active. Citalopram Hydrobromide (10MG Tablet, Oral) Active. Potassium Chloride ER (10MEQ Tablet ER, Oral) Active. Vitamin B-12 (1000MCG Tablet, Oral) Active. Medications Reconciled  Social History Ulani Degrasse, [place];  01/21/2018 11:17 AM) Alcohol use  Heavy alcohol use. Caffeine use  Coffee. No drug use  Tobacco use  Never smoker.  Family History Lillah Standre, [place]; 01/21/2018 11:17 AM) Family history unknown  First Degree Relatives   Pregnancy / Birth History Kameshia Madruga, [place]; 01/21/2018 11:17 AM) Durenda Age  5 Maternal age  59-20 Para  6  Other Problems Mardy Hoppe, [place]; 01/21/2018 11:17 AM) Alcohol Abuse  Breast Cancer  Depression  Pancreatitis  Thyroid Cancer  Thyroid Disease     Review of Systems Dorea Duff RMA; 01/21/2018 11:17 AM) General Not Present- Appetite Loss, Chills, Fatigue, Fever, Night Sweats, Weight Gain and Weight Loss. Skin Not Present- Change in Wart/Mole, Dryness, Hives, Jaundice, New Lesions, Non-Healing Wounds, Rash and Ulcer. HEENT Not Present- Earache, Hearing Loss, Hoarseness, Nose Bleed, Oral Ulcers, Ringing in the Ears, Seasonal Allergies, Sinus Pain, Sore Throat, Visual Disturbances, Wears glasses/contact lenses and Yellow Eyes. Respiratory Not Present- Bloody sputum, Chronic Cough, Difficulty Breathing, Snoring and Wheezing. Breast Not Present- Breast Mass, Breast Pain, Nipple Discharge and Skin Changes. Cardiovascular Not Present- Chest Pain, Difficulty Breathing Lying Down, Leg Cramps, Palpitations, Rapid Heart Rate, Shortness of Breath and Swelling of Extremities. Gastrointestinal Not Present- Abdominal Pain, Bloating, Bloody Stool, Change in Bowel Habits, Chronic diarrhea, Constipation, Difficulty Swallowing, Excessive gas, Gets full quickly at meals, Hemorrhoids, Indigestion, Nausea, Rectal Pain and Vomiting. Female Genitourinary Not Present- Frequency, Nocturia, Painful Urination, Pelvic Pain and Urgency. Musculoskeletal Present- Joint Pain. Not Present- Back Pain, Joint Stiffness, Muscle Pain, Muscle Weakness and Swelling of Extremities. Neurological Present- Trouble walking. Not Present- Decreased Memory, Fainting, Headaches, Numbness,  Seizures, Tingling, Tremor and Weakness. Psychiatric Present- Depression. Not Present- Anxiety, Bipolar, Change in Sleep Pattern, Fearful and Frequent crying.  Vitals Briea Mcenery RMA; 01/21/2018 11:18 AM) 01/21/2018 11:17 AM Weight: 183.2 lb Height: 57in Body Surface Area: 1.73 m Body Mass Index: 39.64 kg/m  Temp.: 98.5F  Pulse: 77 (Regular)  BP: 135/80 (Sitting, Left Arm, Standard)       Physical Exam Adin Hector MD; 01/21/2018 11:47 AM) General Mental Status-Alert. General Appearance-Not in acute distress, Not Sickly. Orientation-Oriented X3. Hydration-Well hydrated. Voice-Normal.  Integumentary Global Assessment Upon inspection and palpation of skin surfaces of the - Axillae: non-tender, no inflammation or ulceration, no drainage. and Distribution of scalp and body hair is normal. General Characteristics Temperature - normal warmth is noted.  Head and Neck Head-normocephalic, atraumatic with no lesions or palpable masses. Face Global Assessment - atraumatic, no absence of expression. Neck Global Assessment - no abnormal movements, no bruit auscultated on the right, no bruit auscultated on the left, no decreased range of motion, non-tender. Trachea-midline. Thyroid Gland Characteristics - non-tender.  Eye Eyeball - Left-Extraocular movements intact, No Nystagmus. Eyeball - Right-Extraocular movements intact, No Nystagmus. Cornea - Left-No Hazy. Cornea - Right-No Hazy. Sclera/Conjunctiva - Left-No scleral icterus, No Discharge. Sclera/Conjunctiva - Right-No scleral icterus, No Discharge. Pupil - Left-Direct reaction to light normal. Pupil - Right-Direct reaction to light normal.  ENMT Ears Pinna - Left - no drainage observed, no generalized tenderness observed. Right - no drainage observed, no generalized tenderness observed. Nose and Sinuses External Inspection of the Nose - no destructive lesion observed.  Inspection of the nares - Left - quiet respiration. Right - quiet respiration. Mouth and Throat Lips - Upper Lip - no fissures observed, no pallor noted. Lower Lip - no fissures observed, no pallor noted. Nasopharynx - no discharge present. Oral Cavity/Oropharynx - Tongue - no dryness observed. Oral Mucosa - no cyanosis observed. Hypopharynx - no evidence of  airway distress observed.  Chest and Lung Exam Inspection Movements - Normal and Symmetrical. Accessory muscles - No use of accessory muscles in breathing. Palpation Palpation of the chest reveals - Non-tender. Auscultation Breath sounds - Normal and Clear.  Cardiovascular Auscultation Rhythm - Regular. Murmurs & Other Heart Sounds - Auscultation of the heart reveals - No Murmurs and No Systolic Clicks.  Abdomen Inspection Inspection of the abdomen reveals - No Visible peristalsis and No Abnormal pulsations. Umbilicus - No Bleeding, No Urine drainage. Palpation/Percussion Palpation and Percussion of the abdomen reveal - Soft, Non Tender, No Rebound tenderness, No Rigidity (guarding) and No Cutaneous hyperesthesia. Note: Abdomen morbidly obese but soft. Mild epigastric discomfort but no Murphy sign. Not severely distended. No distasis recti. No umbilical or other anterior abdominal wall hernias   Female Genitourinary Sexual Maturity Tanner 5 - Adult hair pattern. Note: No inguinal hernias. No vaginal bleeding nor discharge   Peripheral Vascular Upper Extremity Inspection - Left - No Cyanotic nailbeds, Not Ischemic. Right - No Cyanotic nailbeds, Not Ischemic.  Neurologic Neurologic evaluation reveals -normal attention span and ability to concentrate, able to name objects and repeat phrases. Appropriate fund of knowledge , normal sensation and normal coordination. Mental Status Affect - not angry, not paranoid. Cranial Nerves-Normal Bilaterally. Gait-Normal.  Neuropsychiatric Mental status exam performed with  findings of-able to articulate well with normal speech/language, rate, volume and coherence, thought content normal with ability to perform basic computations and apply abstract reasoning and no evidence of hallucinations, delusions, obsessions or homicidal/suicidal ideation.  Musculoskeletal Global Assessment Spine, Ribs and Pelvis - no instability, subluxation or laxity. Right Upper Extremity - no instability, subluxation or laxity.  Lymphatic Head & Neck  General Head & Neck Lymphatics: Bilateral - Description - No Localized lymphadenopathy. Axillary  General Axillary Region: Bilateral - Description - No Localized lymphadenopathy. Femoral & Inguinal  Generalized Femoral & Inguinal Lymphatics: Left - Description - No Localized lymphadenopathy. Right - Description - No Localized lymphadenopathy.    Assessment & Plan Adin Hector MD; 01/21/2018 11:52 AM) OTHER CIRRHOSIS OF LIVER (K74.69) Impression: Child's A/B as best I can gather. We'll get input from Dr. Havery Moros. Consider liver biopsy at the time of cholecystectomy CHRONIC CHOLECYSTITIS WITH CALCULUS (K80.10) Impression: Rather classic story of biliary colic with bump in liver function tests. Suspicious for transitional choledocholithiasis.  I think she would benefit from cholecystectomy. Usually trying to single site approach but with her obesity, low threshold to change to a regular 4-port. Most likely overnight stay  With her cirrhosis, would like input from her gastroenterologist. She does not seem to have a high meld score. She seems to be controlled with medication. Most likely Ardine Eng a. Certainly no strong evidence of hepatic insufficiency.  I would like medical clearance for her primary care physician to make sure that she does not feel like cardiac clearance is needed. Hopefully not too likely but we will see. We were not able to reach Dr. Mariea Clonts today Current Plans I recommended obtaining preoperative medical  clearance. I am concerned about the health of the patient and the ability to tolerate the operation. Therefore, we will request clearance by medicine to better assess operative risk & see if a reevaluation, further workup, etc is needed. Also recommendations on how medications should be managed/held/restarted after surgery. You are being scheduled for surgery- Our schedulers will call you.  You should hear from our office's scheduling department within 5 working days about the location, date, and time of surgery. We try  to make accommodations for patient's preferences in scheduling surgery, but sometimes the OR schedule or the surgeon's schedule prevents [place] from making those accommodations.  If you have not heard from our office [phone]) in 5 working days, call the office and ask for your surgeon's nurse.  If you have other questions about your diagnosis, plan, or surgery, call the office and ask for your surgeon's nurse.  Written instructions provided Pt Education - Pamphlet Given - Laparoscopic Gallbladder Surgery: discussed with patient and provided information. The anatomy & physiology of hepatobiliary & pancreatic function was discussed. The pathophysiology of gallbladder dysfunction was discussed. Natural history risks without surgery was discussed. I feel the risks of no intervention will lead to serious problems that outweigh the operative risks; therefore, I recommended cholecystectomy to remove the pathology. I explained laparoscopic techniques with possible need for an open approach. Probable cholangiogram to evaluate the bilary tract was explained as well.  Risks such as bleeding, infection, abscess, leak, injury to other organs, need for further treatment, heart attack, death, and other risks were discussed. I noted a good likelihood this will help address the problem. Possibility that this will not correct all abdominal symptoms was explained. Goals of post-operative  recovery were discussed as well. We will work to minimize complications. An educational handout further explaining the pathology and treatment options was given as well. Questions were answered. The patient expresses understanding & wishes to proceed with surgery.  Pt Education - [facility] Laparosopic Post Op HCI (Marchello Rothgeb) Pt Education - [facility] Good Bowel Health (Jenner Rosier) Pt Education - Laparoscopic Cholecystectomy: gallbladder DYSPHASIA (R47.02) Impression: Uncertain etiology. No obvious giant hiatal hernia on prior EGD. We'll defer to gastrology, I instincts as she would benefit from for lower endoscopy at some point. I do think it is a separate issue from her biliary colic. PERSONAL HISTORY OF COLONIC POLYPS (Z86.010) Current Plans Instructed to schedule colonoscopywith a gastroenterologist  Adin Hector, M.D., F.A.C.S. Gastrointestinal and Minimally Invasive Surgery [facility] [address], [place], [place] [address] [phone] Main / Paging

## 2018-01-31 NOTE — H&P (View-Only) (Signed)
Christina [name]  DOB: [birth year] Widowed / Language: Christina [name] / Race: White Female  Patient Care Team: Christina Curry, DO as PCP - General (Geriatric Medicine) Christina [name], Christina Raspberry, [name] as Consulting Physician (Gastroenterology) Christina Boston, [name] as Consulting Physician (General Surgery)     Christina [name] Kitchen ` Patient sent for surgical consultation at the request of Christina [name], [facility] health emergency Department.  Chief Complaint:  The patient is an obese elderly woman recently relocated from [place] Spring 2018 to live with her family. Son and daughter been helping try and get her health under better. He has [name] history of cirrhosis. Apparently was rather sick but was tuned up and has not had any worsening flares in the past few years. She had EGD [name] few years ago showing some varices. Or hernia. She was having some complaints of worsening dysphagia to solids. Saw gastroenterology. They were discussing about doing upper endoscopy versus symptomatic management. Has [name] history of colon polyps. Had colonoscopy attempted in 2016 done in [place]. Apparently was [name] poor prep and repeat was recommended. That had not happened yet. Patient usually moves her bowels 2 or 3 times [name] day without with the lactulose.  Patient had episode of severe epigastric pain all day after having below the night before. She had eggs in the morning and made it worse. Felt nauseated and threw up. Persistent symptoms. Went to primary care office. Apparently laboratory values were abnormal. Recommended go to emergency room. Found to have [name] bilirubin of 2.7. Normally it is been about 1. Ultrasound showed gallstones but no ductal dilatation. They suspect she was passing [name] common bile duct stone. Surgical consultation recommended.  She followed up with gastroenterology. They suspect that she may benefit from cholecystectomy and agreed with surgical consultation at some point. Considering upper and lower  endoscopy in the future.  Patient comes today with her daughter. She had [name] tubal ligation but no other abdominal surgery. She gets short of breath after walking [name] block or 2. She does not smoke. She denies ever having any heart attack or stroke or lung problems. She has had her right shoulder frozens. She does walk with [name] cane. No personal nor family history of GI/colon cancer, inflammatory bowel disease, irritable bowel syndrome, allergy such as Celiac Sprue, dietary/dairy problems, colitis, ulcers nor gastritis. No recent sick contacts/gastroenteritis. No travel outside the country. No changes in diet. No dysphagia to solids or liquids. No significant heartburn or reflux. No hematochezia, hematemesis, coffee ground emesis. No evidence of prior gastric/peptic ulceration.  (Review of systems as stated in this history (HPI) or in the review of systems. Otherwise all other 12 point ROS are negative)   Past Surgical History Christina [name], [place]; 01/21/2018 11:17 AM) Knee Surgery  Bilateral.  Diagnostic Studies History Christina [name], Christina [name]; 01/21/2018 11:17 AM) Colonoscopy  1-5 years ago Mammogram  within last year  Allergies Christina [name], Christina [name]; 01/21/2018 11:18 AM) No Known Drug Allergies [01/21/2018]: Allergies Reconciled   Medication History Aayushi Solorzano, [place]; 01/21/2018 11:20 AM) TraMADol HCl (50MG Tablet, Oral) Active. BuPROPion HCl ER (XL) (150MG Tablet ER 24HR, Oral) Active. Citalopram Hydrobromide (40MG Tablet, Oral) Active. Furosemide (20MG Tablet, Oral) Active. Ketoconazole (2% Shampoo, External) Active. Levothyroxine Sodium (88MCG Tablet, Oral) Active. Spironolactone (100MG Tablet, Oral) Active. Lactulose (10GM/15ML Solution, Oral) Active. Citalopram Hydrobromide (10MG Tablet, Oral) Active. Potassium Chloride ER (10MEQ Tablet ER, Oral) Active. Vitamin B-12 (1000MCG Tablet, Oral) Active. Medications Reconciled  Social History Lundyn Coste, [place];  01/21/2018 11:17 AM) Alcohol use  Heavy alcohol use. Caffeine use  Coffee. No drug use  Tobacco use  Never smoker.  Family History Jonise Weightman, [place]; 01/21/2018 11:17 AM) Family history unknown  First Degree Relatives   Pregnancy / Birth History Aneliese Beaudry, [place]; 01/21/2018 11:17 AM) Durenda Age  5 Maternal age  32-20 Para  6  Other Problems Jaretzy Lhommedieu, [place]; 01/21/2018 11:17 AM) Alcohol Abuse  Breast Cancer  Depression  Pancreatitis  Thyroid Cancer  Thyroid Disease     Review of Systems Christina [name]; 01/21/2018 11:17 AM) General Not Present- Appetite Loss, Chills, Fatigue, Fever, Night Sweats, Weight Gain and Weight Loss. Skin Not Present- Change in Wart/Mole, Dryness, Hives, Jaundice, New Lesions, Non-Healing Wounds, Rash and Ulcer. HEENT Not Present- Earache, Hearing Loss, Hoarseness, Nose Bleed, Oral Ulcers, Ringing in the Ears, Seasonal Allergies, Sinus Pain, Sore Throat, Visual Disturbances, Wears glasses/contact lenses and Yellow Eyes. Respiratory Not Present- Bloody sputum, Chronic Cough, Difficulty Breathing, Snoring and Wheezing. Breast Not Present- Breast Mass, Breast Pain, Nipple Discharge and Skin Changes. Cardiovascular Not Present- Chest Pain, Difficulty Breathing Lying Down, Leg Cramps, Palpitations, Rapid Heart Rate, Shortness of Breath and Swelling of Extremities. Gastrointestinal Not Present- Abdominal Pain, Bloating, Bloody Stool, Change in Bowel Habits, Chronic diarrhea, Constipation, Difficulty Swallowing, Excessive gas, Gets full quickly at meals, Hemorrhoids, Indigestion, Nausea, Rectal Pain and Vomiting. Female Genitourinary Not Present- Frequency, Nocturia, Painful Urination, Pelvic Pain and Urgency. Musculoskeletal Present- Joint Pain. Not Present- Back Pain, Joint Stiffness, Muscle Pain, Muscle Weakness and Swelling of Extremities. Neurological Present- Trouble walking. Not Present- Decreased Memory, Fainting, Headaches, Numbness,  Seizures, Tingling, Tremor and Weakness. Psychiatric Present- Depression. Not Present- Anxiety, Bipolar, Change in Sleep Pattern, Fearful and Frequent crying.  Vitals Christina [name]; 01/21/2018 11:18 AM) 01/21/2018 11:17 AM Weight: 183.2 lb Height: 57in Body Surface Area: 1.73 m Body Mass Index: 39.64 kg/m  Temp.: 98.59F  Pulse: 77 (Regular)  BP: 135/80 (Sitting, Left Arm, Standard)       Physical Exam Christina [name]; 01/21/2018 11:47 AM) General Mental Status-Alert. General Appearance-Not in acute distress, Not Sickly. Orientation-Oriented X3. Hydration-Well hydrated. Voice-Normal.  Integumentary Global Assessment Upon inspection and palpation of skin surfaces of the - Axillae: non-tender, no inflammation or ulceration, no drainage. and Distribution of scalp and body hair is normal. General Characteristics Temperature - normal warmth is noted.  Head and Neck Head-normocephalic, atraumatic with no lesions or palpable masses. Face Global Assessment - atraumatic, no absence of expression. Neck Global Assessment - no abnormal movements, no bruit auscultated on the right, no bruit auscultated on the left, no decreased range of motion, non-tender. Trachea-midline. Thyroid Gland Characteristics - non-tender.  Eye Eyeball - Left-Extraocular movements intact, No Nystagmus. Eyeball - Right-Extraocular movements intact, No Nystagmus. Cornea - Left-No Hazy. Cornea - Right-No Hazy. Sclera/Conjunctiva - Left-No scleral icterus, No Discharge. Sclera/Conjunctiva - Right-No scleral icterus, No Discharge. Pupil - Left-Direct reaction to light normal. Pupil - Right-Direct reaction to light normal.  ENMT Ears Pinna - Left - no drainage observed, no generalized tenderness observed. Right - no drainage observed, no generalized tenderness observed. Nose and Sinuses External Inspection of the Nose - no destructive lesion observed.  Inspection of the nares - Left - quiet respiration. Right - quiet respiration. Mouth and Throat Lips - Upper Lip - no fissures observed, no pallor noted. Lower Lip - no fissures observed, no pallor noted. Nasopharynx - no discharge present. Oral Cavity/Oropharynx - Tongue - no dryness observed. Oral Mucosa - no cyanosis observed. Hypopharynx - no evidence of  airway distress observed.  Chest and Lung Exam Inspection Movements - Normal and Symmetrical. Accessory muscles - No use of accessory muscles in breathing. Palpation Palpation of the chest reveals - Non-tender. Auscultation Breath sounds - Normal and Clear.  Cardiovascular Auscultation Rhythm - Regular. Murmurs & Other Heart Sounds - Auscultation of the heart reveals - No Murmurs and No Systolic Clicks.  Abdomen Inspection Inspection of the abdomen reveals - No Visible peristalsis and No Abnormal pulsations. Umbilicus - No Bleeding, No Urine drainage. Palpation/Percussion Palpation and Percussion of the abdomen reveal - Soft, Non Tender, No Rebound tenderness, No Rigidity (guarding) and No Cutaneous hyperesthesia. Note: Abdomen morbidly obese but soft. Mild epigastric discomfort but no Murphy sign. Not severely distended. No distasis recti. No umbilical or other anterior abdominal wall hernias   Female Genitourinary Sexual Maturity Tanner 5 - Adult hair pattern. Note: No inguinal hernias. No vaginal bleeding nor discharge   Peripheral Vascular Upper Extremity Inspection - Left - No Cyanotic nailbeds, Not Ischemic. Right - No Cyanotic nailbeds, Not Ischemic.  Neurologic Neurologic evaluation reveals -normal attention span and ability to concentrate, able to name objects and repeat phrases. Appropriate fund of knowledge , normal sensation and normal coordination. Mental Status Affect - not angry, not paranoid. Cranial Nerves-Normal Bilaterally. Gait-Normal.  Neuropsychiatric Mental status exam performed with  findings of-able to articulate well with normal speech/language, rate, volume and coherence, thought content normal with ability to perform basic computations and apply abstract reasoning and no evidence of hallucinations, delusions, obsessions or homicidal/suicidal ideation.  Musculoskeletal Global Assessment Spine, Ribs and Pelvis - no instability, subluxation or laxity. Right Upper Extremity - no instability, subluxation or laxity.  Lymphatic Head & Neck  General Head & Neck Lymphatics: Bilateral - Description - No Localized lymphadenopathy. Axillary  General Axillary Region: Bilateral - Description - No Localized lymphadenopathy. Femoral & Inguinal  Generalized Femoral & Inguinal Lymphatics: Left - Description - No Localized lymphadenopathy. Right - Description - No Localized lymphadenopathy.    Assessment & Plan Christina [name]; 01/21/2018 11:52 AM) OTHER CIRRHOSIS OF LIVER (K74.69) Impression: Child's [name]/B as best I can gather. We'll get input from Christina [name]. Consider liver biopsy at the time of cholecystectomy CHRONIC CHOLECYSTITIS WITH CALCULUS (K80.10) Impression: Rather classic story of biliary colic with bump in liver function tests. Suspicious for transitional choledocholithiasis.  I think she would benefit from cholecystectomy. Usually trying to single site approach but with her obesity, low threshold to change to [name] regular 4-port. Most likely overnight stay  With her cirrhosis, would like input from her gastroenterologist. She does not seem to have [name] high meld score. She seems to be controlled with medication. Most likely Christina [name]. Certainly no strong evidence of hepatic insufficiency.  I would like medical clearance for her primary care physician to make sure that she does not feel like cardiac clearance is needed. Hopefully not too likely but we will see. We were not able to reach Dr. Mariea Clonts today Current Plans I recommended obtaining preoperative medical  clearance. I am concerned about the health of the patient and the ability to tolerate the operation. Therefore, we will request clearance by medicine to better assess operative risk & see if [name] reevaluation, further workup, etc is needed. Also recommendations on how medications should be managed/held/restarted after surgery. You are being scheduled for surgery- Our schedulers will call you.  You should hear from our office's scheduling department within 5 working days about the location, date, and time of surgery. We try  to make accommodations for patient's preferences in scheduling surgery, but sometimes the OR schedule or the surgeon's schedule prevents [place] from making those accommodations.  If you have not heard from our office [phone]) in 5 working days, call the office and ask for your surgeon's nurse.  If you have other questions about your diagnosis, plan, or surgery, call the office and ask for your surgeon's nurse.  Written instructions provided Pt Education - Christina [name] - Laparoscopic Gallbladder Surgery: discussed with patient and provided information. The anatomy & physiology of hepatobiliary & pancreatic function was discussed. The pathophysiology of gallbladder dysfunction was discussed. Natural history risks without surgery was discussed. I feel the risks of no intervention will lead to serious problems that outweigh the operative risks; therefore, I recommended cholecystectomy to remove the pathology. I explained laparoscopic techniques with possible need for an open approach. Probable cholangiogram to evaluate the bilary tract was explained as well.  Risks such as bleeding, infection, abscess, leak, injury to other organs, need for further treatment, heart attack, death, and other risks were discussed. I noted [name] good likelihood this will help address the problem. Possibility that this will not correct all abdominal symptoms was explained. Goals of post-operative  recovery were discussed as well. We will work to minimize complications. An educational handout further explaining the pathology and treatment options was [name] as well. Questions were answered. The patient expresses understanding & wishes to proceed with surgery.  Pt Education - [facility] Laparosopic Post Op HCI (Christina [name]) Pt Education - [facility] Good Bowel Health (Jeremian Whitby) Pt Education - Laparoscopic Cholecystectomy: gallbladder DYSPHASIA (R47.02) Impression: Uncertain etiology. No obvious giant hiatal hernia on prior EGD. We'll defer to gastrology, I instincts as she would benefit from for lower endoscopy at some point. I do think it is [name] separate issue from her biliary colic. PERSONAL HISTORY OF COLONIC POLYPS (Z86.010) Current Plans Instructed to schedule colonoscopywith [name] gastroenterologist  Christina Hector, M.D., F.[name].C.S. Gastrointestinal and Minimally Invasive Surgery [facility][name]. [address] [place], [place] [address] [phone] [address] / Paging

## 2018-02-06 ENCOUNTER — Other Ambulatory Visit: Payer: Self-pay | Admitting: Internal Medicine

## 2018-02-11 ENCOUNTER — Other Ambulatory Visit: Payer: Self-pay | Admitting: Internal Medicine

## 2018-02-11 NOTE — Progress Notes (Signed)
01/22/2018- note in Epic-Medical clearance from Sherrie Mustache, NP for Dr. Hollace Kinnier.  01/08/2018- noted in Epic- CT abdomen Pelvis with contrast

## 2018-02-11 NOTE — Patient Instructions (Addendum)
Christina [name]  02/11/2018   Your procedure is scheduled on: Wednesday 02/19/2018  Report to [facility] Main  Entrance              Report to admitting at1000 AM   Call this number if you have problems the morning of surgery [phone]   NO SOLID FOOD AFTER MIDNIGHT THE NIGHT PRIOR TO SURGERY. NOTHING BY MOUTH EXCEPT CLEAR LIQUIDS UNTIL 3 HOURS PRIOR TO [place] SURGERY. PLEASE FINISH ENSURE DRINK PER SURGEON ORDER 3 HOURS PRIOR TO SCHEDULED SURGERY TIME WHICH NEEDS TO BE COMPLETED AT 0900 am.    CLEAR LIQUID DIET   Foods Allowed                                                                     Foods Excluded  Coffee and tea, regular and decaf                             liquids that you cannot  Plain Jell-O in any flavor                                             see through such as: Fruit ices (not with fruit pulp)                                     milk, soups, orange juice  Iced Popsicles                                    All solid food Carbonated beverages, regular and diet                                    Cranberry, grape and apple juices Sports drinks like Gatorade Lightly seasoned clear broth or consume(fat free) Sugar, honey syrup  Sample Menu Breakfast                                Lunch                                     Supper Cranberry juice                    Beef broth                            Chicken broth Jell-O                                     Grape juice  Apple juice Coffee or tea                        Jell-O                                      Popsicle                                                Coffee or tea                        Coffee or tea  _____________________________________________________________________     Remember: Do not eat food or drink liquids :After Midnight.     Take these medicines the morning of surgery with A SIP OF WATER: Bupropion (Wellbutrin XL),  Citalopram (Celexa), Levothyroxine (Synthroid), Propanolol (Inderal)                                You may not have any metal on your body including hair pins and              piercings  Do not wear jewelry, make-up, lotions, powders or perfumes, deodorant             Do not wear nail polish.  Do not shave  48 hours prior to surgery.              Men may shave face and neck.   Do not bring valuables to the hospital. [place].  Contacts, dentures or bridgework may not be worn into surgery.  Leave suitcase in the car. After surgery it may be brought to your room.                  Please read over the following fact sheets you were given: _____________________________________________________________________             [facility] - Preparing for Surgery Before surgery, you can play an important role.  Because skin is not sterile, your skin needs to be as free of germs as possible.  You can reduce the number of germs on your skin by washing with CHG (chlorahexidine gluconate) soap before surgery.  CHG is an antiseptic cleaner which kills germs and bonds with the skin to continue killing germs even after washing. Please DO NOT use if you have an allergy to CHG or antibacterial soaps.  If your skin becomes reddened/irritated stop using the CHG and inform your nurse when you arrive at Short Stay. Do not shave (including legs and underarms) for at least 48 hours prior to the first CHG shower.  You may shave your face/neck. Please follow these instructions carefully:  1.  Shower with CHG Soap the night before surgery and the  morning of Surgery.  2.  If you choose to wash your hair, wash your hair first as usual with your  normal  shampoo.  3.  After you shampoo, rinse your hair and body thoroughly to remove the  shampoo.  4.  Use CHG as you would any other liquid soap.  You can apply chg directly  to the skin and wash                        Gently with a scrungie or clean washcloth.  5.  Apply the CHG Soap to your body ONLY FROM THE NECK DOWN.   Do not use on face/ open                           Wound or open sores. Avoid contact with eyes, ears mouth and genitals (private parts).                       Wash face,  Genitals (private parts) with your normal soap.             6.  Wash thoroughly, paying special attention to the area where your surgery  will be performed.  7.  Thoroughly rinse your body with warm water from the neck down.  8.  DO NOT shower/wash with your normal soap after using and rinsing off  the CHG Soap.                9.  Pat yourself dry with a clean towel.            10.  Wear clean pajamas.            11.  Place clean sheets on your bed the night of your first shower and do not  sleep with pets. Day of Surgery : Do not apply any lotions/deodorants the morning of surgery.  Please wear clean clothes to the hospital/surgery center.  FAILURE TO FOLLOW THESE INSTRUCTIONS MAY RESULT IN THE CANCELLATION OF YOUR SURGERY PATIENT SIGNATURE_________________________________  NURSE SIGNATURE__________________________________  ________________________________________________________________________

## 2018-02-12 ENCOUNTER — Encounter (HOSPITAL_COMMUNITY): Payer: Self-pay

## 2018-02-12 ENCOUNTER — Other Ambulatory Visit: Payer: Self-pay

## 2018-02-12 ENCOUNTER — Encounter (HOSPITAL_COMMUNITY)
Admission: RE | Admit: 2018-02-12 | Discharge: 2018-02-12 | Disposition: A | Discharge status: Home / Self Care | Payer: Medicare Other | Source: Ambulatory Visit | Attending: Surgery | Admitting: Surgery

## 2018-02-12 ENCOUNTER — Ambulatory Visit (INDEPENDENT_AMBULATORY_CARE_PROVIDER_SITE_OTHER): Payer: Medicare Other | Admitting: Gastroenterology

## 2018-02-12 DIAGNOSIS — K7031 Alcoholic cirrhosis of liver with ascites: Secondary | ICD-10-CM

## 2018-02-12 DIAGNOSIS — Z23 Encounter for immunization: Secondary | ICD-10-CM | POA: Diagnosis not present

## 2018-02-12 DIAGNOSIS — Z01818 Encounter for other preprocedural examination: Secondary | ICD-10-CM | POA: Diagnosis not present

## 2018-02-12 DIAGNOSIS — I10 Essential (primary) hypertension: Secondary | ICD-10-CM | POA: Insufficient documentation

## 2018-02-12 DIAGNOSIS — K7581 Nonalcoholic steatohepatitis (NASH): Secondary | ICD-10-CM | POA: Insufficient documentation

## 2018-02-12 DIAGNOSIS — R9431 Abnormal electrocardiogram [ECG] [EKG]: Secondary | ICD-10-CM | POA: Diagnosis not present

## 2018-02-12 DIAGNOSIS — K811 Chronic cholecystitis: Secondary | ICD-10-CM | POA: Insufficient documentation

## 2018-02-12 HISTORY — DX: Adverse effect of unspecified anesthetic, initial encounter: T41.45XA

## 2018-02-12 HISTORY — DX: Other complications of anesthesia, initial encounter: T88.59XA

## 2018-02-12 LAB — COMPREHENSIVE METABOLIC PANEL
ALT: 22 U/L (ref 14–54)
ANION GAP: 9 (ref 5–15)
AST: 28 U/L (ref 15–41)
Albumin: 3.4 g/dL — ABNORMAL LOW (ref 3.5–5.0)
Alkaline Phosphatase: 68 U/L (ref 38–126)
BILIRUBIN TOTAL: 0.8 mg/dL (ref 0.3–1.2)
BUN: 21 mg/dL — AB (ref 6–20)
CO2: 26 mmol/L (ref 22–32)
Calcium: 9.3 mg/dL (ref 8.9–10.3)
Chloride: 102 mmol/L (ref 101–111)
Creatinine, Ser: 0.84 mg/dL (ref 0.44–1.00)
Glucose, Bld: 90 mg/dL (ref 65–99)
POTASSIUM: 4.9 mmol/L (ref 3.5–5.1)
Sodium: 137 mmol/L (ref 135–145)
TOTAL PROTEIN: 7.4 g/dL (ref 6.5–8.1)

## 2018-02-12 LAB — CBC
HEMATOCRIT: 37.6 % (ref 36.0–46.0)
HEMOGLOBIN: 12.5 g/dL (ref 12.0–15.0)
MCH: 31.5 pg (ref 26.0–34.0)
MCHC: 33.2 g/dL (ref 30.0–36.0)
MCV: 94.7 fL (ref 78.0–100.0)
Platelets: 209 10*3/uL (ref 150–400)
RBC: 3.97 MIL/uL (ref 3.87–5.11)
RDW: 12.9 % (ref 11.5–15.5)
WBC: 7.9 10*3/uL (ref 4.0–10.5)

## 2018-02-12 NOTE — Progress Notes (Signed)
   02/12/18 1328  OBSTRUCTIVE SLEEP APNEA  Have you ever been diagnosed with sleep apnea through a sleep study? No  Do you snore loudly (loud enough to be heard through closed doors)?  0  Do you often feel tired, fatigued, or sleepy during the daytime (such as falling asleep during driving or talking to someone)? 1  Has anyone observed you stop breathing during your sleep? 0  Do you have, or are you being treated for high blood pressure? 1  BMI more than 35 kg/m2? 1  Age > 50 (1-yes) 1  Neck circumference greater than:Female 16 inches or larger, Female 17inches or larger? 1  Female Gender (Yes=1) 0  Obstructive Sleep Apnea Score 5  Score 5 or greater  Results sent to PCP

## 2018-02-17 DIAGNOSIS — F4321 Adjustment disorder with depressed mood: Secondary | ICD-10-CM | POA: Diagnosis not present

## 2018-02-19 ENCOUNTER — Ambulatory Visit (HOSPITAL_COMMUNITY): Payer: Medicare Other | Admitting: Certified Registered Nurse Anesthetist

## 2018-02-19 ENCOUNTER — Ambulatory Visit (HOSPITAL_COMMUNITY): Payer: Medicare Other

## 2018-02-19 ENCOUNTER — Other Ambulatory Visit: Payer: Self-pay

## 2018-02-19 ENCOUNTER — Observation Stay (HOSPITAL_COMMUNITY)
Admission: RE | Admit: 2018-02-19 | Discharge: 2018-02-20 | Disposition: A | Discharge status: Home / Self Care | Payer: Medicare Other | Source: Ambulatory Visit | Attending: Surgery | Admitting: Surgery

## 2018-02-19 ENCOUNTER — Encounter (HOSPITAL_COMMUNITY)
Admission: RE | Disposition: A | Discharge status: Home / Self Care | Payer: Self-pay | Source: Ambulatory Visit | Attending: Surgery

## 2018-02-19 ENCOUNTER — Encounter (HOSPITAL_COMMUNITY): Payer: Self-pay | Admitting: *Deleted

## 2018-02-19 DIAGNOSIS — E039 Hypothyroidism, unspecified: Secondary | ICD-10-CM | POA: Insufficient documentation

## 2018-02-19 DIAGNOSIS — Z79899 Other long term (current) drug therapy: Secondary | ICD-10-CM | POA: Diagnosis not present

## 2018-02-19 DIAGNOSIS — E538 Deficiency of other specified B group vitamins: Secondary | ICD-10-CM | POA: Diagnosis not present

## 2018-02-19 DIAGNOSIS — M1990 Unspecified osteoarthritis, unspecified site: Secondary | ICD-10-CM | POA: Diagnosis not present

## 2018-02-19 DIAGNOSIS — K7460 Unspecified cirrhosis of liver: Secondary | ICD-10-CM | POA: Diagnosis not present

## 2018-02-19 DIAGNOSIS — K828 Other specified diseases of gallbladder: Secondary | ICD-10-CM | POA: Diagnosis not present

## 2018-02-19 DIAGNOSIS — F419 Anxiety disorder, unspecified: Secondary | ICD-10-CM | POA: Diagnosis not present

## 2018-02-19 DIAGNOSIS — K429 Umbilical hernia without obstruction or gangrene: Secondary | ICD-10-CM | POA: Diagnosis not present

## 2018-02-19 DIAGNOSIS — E785 Hyperlipidemia, unspecified: Secondary | ICD-10-CM | POA: Diagnosis not present

## 2018-02-19 DIAGNOSIS — G4700 Insomnia, unspecified: Secondary | ICD-10-CM | POA: Insufficient documentation

## 2018-02-19 DIAGNOSIS — Z853 Personal history of malignant neoplasm of breast: Secondary | ICD-10-CM | POA: Diagnosis not present

## 2018-02-19 DIAGNOSIS — K8010 Calculus of gallbladder with chronic cholecystitis without obstruction: Principal | ICD-10-CM

## 2018-02-19 DIAGNOSIS — Z6838 Body mass index (BMI) 38.0-38.9, adult: Secondary | ICD-10-CM | POA: Insufficient documentation

## 2018-02-19 DIAGNOSIS — M1711 Unilateral primary osteoarthritis, right knee: Secondary | ICD-10-CM | POA: Diagnosis not present

## 2018-02-19 DIAGNOSIS — K420 Umbilical hernia with obstruction, without gangrene: Secondary | ICD-10-CM | POA: Diagnosis not present

## 2018-02-19 DIAGNOSIS — E7800 Pure hypercholesterolemia, unspecified: Secondary | ICD-10-CM | POA: Diagnosis not present

## 2018-02-19 DIAGNOSIS — Z96653 Presence of artificial knee joint, bilateral: Secondary | ICD-10-CM | POA: Diagnosis not present

## 2018-02-19 DIAGNOSIS — Z419 Encounter for procedure for purposes other than remedying health state, unspecified: Secondary | ICD-10-CM

## 2018-02-19 DIAGNOSIS — E876 Hypokalemia: Secondary | ICD-10-CM | POA: Insufficient documentation

## 2018-02-19 DIAGNOSIS — K8044 Calculus of bile duct with chronic cholecystitis without obstruction: Secondary | ICD-10-CM | POA: Diagnosis not present

## 2018-02-19 DIAGNOSIS — G8929 Other chronic pain: Secondary | ICD-10-CM | POA: Insufficient documentation

## 2018-02-19 DIAGNOSIS — C8800 Waldenstrom macroglobulinemia not having achieved remission: Secondary | ICD-10-CM | POA: Diagnosis present

## 2018-02-19 DIAGNOSIS — F329 Major depressive disorder, single episode, unspecified: Secondary | ICD-10-CM | POA: Diagnosis not present

## 2018-02-19 DIAGNOSIS — K7031 Alcoholic cirrhosis of liver with ascites: Secondary | ICD-10-CM | POA: Diagnosis present

## 2018-02-19 DIAGNOSIS — I10 Essential (primary) hypertension: Secondary | ICD-10-CM | POA: Insufficient documentation

## 2018-02-19 DIAGNOSIS — E875 Hyperkalemia: Secondary | ICD-10-CM | POA: Insufficient documentation

## 2018-02-19 DIAGNOSIS — K7030 Alcoholic cirrhosis of liver without ascites: Secondary | ICD-10-CM | POA: Insufficient documentation

## 2018-02-19 HISTORY — PX: LIVER BIOPSY: SHX301

## 2018-02-19 HISTORY — PX: UMBILICAL HERNIA REPAIR: SHX196

## 2018-02-19 HISTORY — PX: LAPAROSCOPIC CHOLECYSTECTOMY SINGLE SITE WITH INTRAOPERATIVE CHOLANGIOGRAM: SHX6538

## 2018-02-19 SURGERY — LAPAROSCOPIC CHOLECYSTECTOMY SINGLE SITE WITH INTRAOPERATIVE CHOLANGIOGRAM
Anesthesia: General | Site: Abdomen

## 2018-02-19 MED ORDER — BUPIVACAINE-EPINEPHRINE 0.25% -1:200000 IJ SOLN
INTRAMUSCULAR | Status: AC
  Filled 2018-02-19: qty 1

## 2018-02-19 MED ORDER — KETAMINE HCL 10 MG/ML IJ SOLN
INTRAMUSCULAR | Status: DC | PRN
  Administered 2018-02-19: 20 mg via INTRAVENOUS

## 2018-02-19 MED ORDER — ALBUMIN HUMAN 5 % IV SOLN
INTRAVENOUS | Status: AC
Start: 2018-02-19 — End: 2018-02-19
  Administered 2018-02-19: 12.5 g via INTRAVENOUS
  Filled 2018-02-19: qty 250

## 2018-02-19 MED ORDER — MELATONIN 5 MG PO TABS
10.0000 mg | ORAL_TABLET | Freq: Every evening | ORAL | Status: DC | PRN
  Filled 2018-02-19: qty 2

## 2018-02-19 MED ORDER — PROCHLORPERAZINE MALEATE 10 MG PO TABS: 10.0000 mg | ORAL_TABLET | Freq: Four times a day (QID) | ORAL | Status: DC | PRN

## 2018-02-19 MED ORDER — LIDOCAINE HCL 2 % IJ SOLN
INTRAMUSCULAR | Status: AC
  Filled 2018-02-19: qty 20

## 2018-02-19 MED ORDER — LIDOCAINE 2% (20 MG/ML) 5 ML SYRINGE
INTRAMUSCULAR | Status: AC
  Filled 2018-02-19: qty 5

## 2018-02-19 MED ORDER — SPIRONOLACTONE 100 MG PO TABS
100.0000 mg | ORAL_TABLET | Freq: Every day | ORAL | Status: DC
  Filled 2018-02-19: qty 1

## 2018-02-19 MED ORDER — DEXAMETHASONE SODIUM PHOSPHATE 10 MG/ML IJ SOLN
INTRAMUSCULAR | Status: AC
  Filled 2018-02-19: qty 1

## 2018-02-19 MED ORDER — TRAMADOL HCL 50 MG PO TABS: 50.0000 mg | ORAL_TABLET | Freq: Four times a day (QID) | ORAL | Status: DC | PRN

## 2018-02-19 MED ORDER — OXYCODONE HCL 5 MG PO TABS
5.0000 mg | ORAL_TABLET | ORAL | Status: DC | PRN
  Administered 2018-02-20: 5 mg via ORAL
  Filled 2018-02-19: qty 1
  Filled 2018-02-19: qty 2

## 2018-02-19 MED ORDER — VITAMIN D 1000 UNITS PO TABS
2000.0000 [IU] | ORAL_TABLET | Freq: Every day | ORAL | Status: DC
  Administered 2018-02-20: 2000 [IU] via ORAL
  Filled 2018-02-19: qty 2

## 2018-02-19 MED ORDER — LIDOCAINE 2% (20 MG/ML) 5 ML SYRINGE
INTRAMUSCULAR | Status: DC | PRN
  Administered 2018-02-19: 40 mg via INTRAVENOUS

## 2018-02-19 MED ORDER — ONDANSETRON HCL 4 MG/2ML IJ SOLN
INTRAMUSCULAR | Status: AC
  Filled 2018-02-19: qty 2

## 2018-02-19 MED ORDER — ADULT MULTIVITAMIN W/MINERALS CH
1.0000 | ORAL_TABLET | Freq: Every day | ORAL | Status: DC
  Administered 2018-02-20: 1 via ORAL
  Filled 2018-02-19: qty 1

## 2018-02-19 MED ORDER — HYDROMORPHONE HCL 1 MG/ML IJ SOLN
0.5000 mg | INTRAMUSCULAR | Status: DC | PRN
  Administered 2018-02-19 – 2018-02-20 (×2): 1 mg via INTRAVENOUS
  Filled 2018-02-19 (×2): qty 1

## 2018-02-19 MED ORDER — LEVOTHYROXINE SODIUM 88 MCG PO TABS
88.0000 ug | ORAL_TABLET | Freq: Every day | ORAL | Status: DC
  Administered 2018-02-20: 88 ug via ORAL
  Filled 2018-02-19: qty 1

## 2018-02-19 MED ORDER — GABAPENTIN 300 MG PO CAPS
300.0000 mg | ORAL_CAPSULE | ORAL | Status: AC
  Administered 2018-02-19: 300 mg via ORAL
  Filled 2018-02-19: qty 1

## 2018-02-19 MED ORDER — BUPROPION HCL ER (XL) 150 MG PO TB24
150.0000 mg | ORAL_TABLET | Freq: Every day | ORAL | Status: DC
  Administered 2018-02-20: 150 mg via ORAL
  Filled 2018-02-19: qty 1

## 2018-02-19 MED ORDER — ALBUMIN HUMAN 5 % IV SOLN
12.5000 g | Freq: Once | INTRAVENOUS | Status: AC
  Administered 2018-02-19: 12.5 g via INTRAVENOUS

## 2018-02-19 MED ORDER — SODIUM CHLORIDE 0.9 % IV SOLN
INTRAVENOUS | Status: DC | PRN
  Administered 2018-02-19: 25 ug/min via INTRAVENOUS

## 2018-02-19 MED ORDER — RACEPINEPHRINE HCL 2.25 % IN NEBU
INHALATION_SOLUTION | RESPIRATORY_TRACT | Status: AC
  Administered 2018-02-19: 0.5 mL via RESPIRATORY_TRACT
  Filled 2018-02-19: qty 0.5

## 2018-02-19 MED ORDER — CHLORHEXIDINE GLUCONATE CLOTH 2 % EX PADS
6.0000 | MEDICATED_PAD | Freq: Once | CUTANEOUS | Status: AC
  Administered 2018-02-19: 6 via TOPICAL

## 2018-02-19 MED ORDER — IOPAMIDOL (ISOVUE-300) INJECTION 61%
INTRAVENOUS | Status: AC
  Filled 2018-02-19: qty 50

## 2018-02-19 MED ORDER — FENTANYL CITRATE (PF) 100 MCG/2ML IJ SOLN
25.0000 ug | INTRAMUSCULAR | Status: DC | PRN
  Administered 2018-02-19: 25 ug via INTRAVENOUS

## 2018-02-19 MED ORDER — LACTULOSE 10 GM/15ML PO SOLN
10.0000 g | Freq: Every day | ORAL | Status: DC
  Administered 2018-02-20: 10 g via ORAL
  Filled 2018-02-19: qty 15

## 2018-02-19 MED ORDER — ENOXAPARIN SODIUM 40 MG/0.4ML ~~LOC~~ SOLN
40.0000 mg | SUBCUTANEOUS | Status: DC
  Administered 2018-02-20: 40 mg via SUBCUTANEOUS
  Filled 2018-02-19: qty 0.4

## 2018-02-19 MED ORDER — OXYCODONE HCL 5 MG/5ML PO SOLN
5.0000 mg | Freq: Once | ORAL | Status: DC | PRN
  Filled 2018-02-19: qty 5

## 2018-02-19 MED ORDER — SIMETHICONE 80 MG PO CHEW: 40.0000 mg | CHEWABLE_TABLET | Freq: Four times a day (QID) | ORAL | Status: DC | PRN

## 2018-02-19 MED ORDER — ACETAMINOPHEN 160 MG/5ML PO SOLN: 325.0000 mg | ORAL | Status: DC | PRN

## 2018-02-19 MED ORDER — ACETAMINOPHEN 325 MG PO TABS: 325.0000 mg | ORAL_TABLET | ORAL | Status: DC | PRN

## 2018-02-19 MED ORDER — ACETAMINOPHEN 500 MG PO TABS
1000.0000 mg | ORAL_TABLET | ORAL | Status: AC
  Administered 2018-02-19: 1000 mg via ORAL
  Filled 2018-02-19: qty 2

## 2018-02-19 MED ORDER — OXYCODONE HCL 5 MG PO TABS: 5.0000 mg | ORAL_TABLET | Freq: Once | ORAL | Status: DC | PRN

## 2018-02-19 MED ORDER — ONDANSETRON HCL 4 MG/2ML IJ SOLN
INTRAMUSCULAR | Status: DC | PRN
  Administered 2018-02-19: 4 mg via INTRAVENOUS

## 2018-02-19 MED ORDER — FENTANYL CITRATE (PF) 250 MCG/5ML IJ SOLN
INTRAMUSCULAR | Status: DC | PRN
  Administered 2018-02-19: 25 ug via INTRAVENOUS
  Administered 2018-02-19: 50 ug via INTRAVENOUS
  Administered 2018-02-19: 25 ug via INTRAVENOUS

## 2018-02-19 MED ORDER — ACETAMINOPHEN 500 MG PO TABS: 500.0000 mg | ORAL_TABLET | ORAL | Status: DC | PRN

## 2018-02-19 MED ORDER — ROCURONIUM BROMIDE 50 MG/5ML IV SOSY
PREFILLED_SYRINGE | INTRAVENOUS | Status: DC | PRN
  Administered 2018-02-19: 5 mg via INTRAVENOUS
  Administered 2018-02-19: 10 mg via INTRAVENOUS
  Administered 2018-02-19: 35 mg via INTRAVENOUS

## 2018-02-19 MED ORDER — PROPOFOL 10 MG/ML IV BOLUS
INTRAVENOUS | Status: DC | PRN
  Administered 2018-02-19: 150 mg via INTRAVENOUS

## 2018-02-19 MED ORDER — BISACODYL 10 MG RE SUPP: 10.0000 mg | Freq: Every day | RECTAL | Status: DC | PRN

## 2018-02-19 MED ORDER — 0.9 % SODIUM CHLORIDE (POUR BTL) OPTIME
TOPICAL | Status: DC | PRN
  Administered 2018-02-19: 1000 mL

## 2018-02-19 MED ORDER — PHENYLEPHRINE HCL 10 MG/ML IJ SOLN
INTRAMUSCULAR | Status: DC | PRN
  Administered 2018-02-19: 80 ug via INTRAVENOUS

## 2018-02-19 MED ORDER — ONDANSETRON 4 MG PO TBDP: 4.0000 mg | ORAL_TABLET | Freq: Four times a day (QID) | ORAL | Status: DC | PRN

## 2018-02-19 MED ORDER — ESMOLOL HCL 100 MG/10ML IV SOLN
INTRAVENOUS | Status: AC
  Filled 2018-02-19: qty 10

## 2018-02-19 MED ORDER — FENTANYL CITRATE (PF) 100 MCG/2ML IJ SOLN
INTRAMUSCULAR | Status: AC
  Filled 2018-02-19: qty 2

## 2018-02-19 MED ORDER — KETOCONAZOLE 2 % EX SHAM
1.0000 "application " | MEDICATED_SHAMPOO | CUTANEOUS | Status: DC
  Filled 2018-02-19: qty 120

## 2018-02-19 MED ORDER — MEPERIDINE HCL 50 MG/ML IJ SOLN: 6.2500 mg | INTRAMUSCULAR | Status: DC | PRN

## 2018-02-19 MED ORDER — BUPIVACAINE-EPINEPHRINE 0.25% -1:200000 IJ SOLN
INTRAMUSCULAR | Status: DC | PRN
  Administered 2018-02-19: 50 mL

## 2018-02-19 MED ORDER — LACTATED RINGERS IR SOLN
Status: DC | PRN
  Administered 2018-02-19: 2000 mL

## 2018-02-19 MED ORDER — LACTATED RINGERS IV SOLN
INTRAVENOUS | Status: DC
  Administered 2018-02-19 (×2): via INTRAVENOUS

## 2018-02-19 MED ORDER — GLYCOPYRROLATE 0.2 MG/ML IJ SOLN
INTRAMUSCULAR | Status: DC | PRN
  Administered 2018-02-19: 0.2 mg via INTRAVENOUS

## 2018-02-19 MED ORDER — HYDRALAZINE HCL 20 MG/ML IJ SOLN: 5.0000 mg | INTRAMUSCULAR | Status: DC | PRN

## 2018-02-19 MED ORDER — POTASSIUM CHLORIDE ER 10 MEQ PO TBCR
10.0000 meq | EXTENDED_RELEASE_TABLET | Freq: Every day | ORAL | Status: DC
  Filled 2018-02-19: qty 1

## 2018-02-19 MED ORDER — STERILE WATER FOR IRRIGATION IR SOLN
Status: DC | PRN
  Administered 2018-02-19: 1000 mL

## 2018-02-19 MED ORDER — LIP MEDEX EX OINT
TOPICAL_OINTMENT | CUTANEOUS | Status: AC
  Filled 2018-02-19: qty 7

## 2018-02-19 MED ORDER — PROCHLORPERAZINE EDISYLATE 5 MG/ML IJ SOLN: 5.0000 mg | Freq: Four times a day (QID) | INTRAMUSCULAR | Status: DC | PRN

## 2018-02-19 MED ORDER — OXYCODONE HCL 5 MG PO TABS: 5.0000 mg | ORAL_TABLET | Freq: Four times a day (QID) | ORAL | 0 refills | Status: DC | PRN

## 2018-02-19 MED ORDER — EPHEDRINE 5 MG/ML INJ
INTRAVENOUS | Status: AC
  Filled 2018-02-19: qty 10

## 2018-02-19 MED ORDER — IBUPROFEN 200 MG PO TABS: 400.0000 mg | ORAL_TABLET | Freq: Every day | ORAL | Status: DC | PRN

## 2018-02-19 MED ORDER — HYDROMORPHONE HCL 1 MG/ML IJ SOLN
0.2500 mg | INTRAMUSCULAR | Status: DC | PRN
  Administered 2018-02-19 (×4): 0.25 mg via INTRAVENOUS

## 2018-02-19 MED ORDER — ALBUMIN HUMAN 5 % IV SOLN
INTRAVENOUS | Status: AC
  Administered 2018-02-19: 12.5 g via INTRAVENOUS
  Filled 2018-02-19: qty 250

## 2018-02-19 MED ORDER — CITALOPRAM HYDROBROMIDE 40 MG PO TABS
40.0000 mg | ORAL_TABLET | Freq: Every day | ORAL | Status: DC
Start: 2018-02-20 — End: 2018-02-20
  Filled 2018-02-19: qty 1

## 2018-02-19 MED ORDER — SUCCINYLCHOLINE CHLORIDE 200 MG/10ML IV SOSY
PREFILLED_SYRINGE | INTRAVENOUS | Status: AC
  Filled 2018-02-19: qty 10

## 2018-02-19 MED ORDER — POLYETHYLENE GLYCOL 3350 17 G PO PACK: 17.0000 g | PACK | Freq: Every day | ORAL | Status: DC | PRN

## 2018-02-19 MED ORDER — VITAMIN D3 50 MCG (2000 UT) PO TABS: 1.0000 | ORAL_TABLET | ORAL | Status: DC

## 2018-02-19 MED ORDER — EPHEDRINE SULFATE 50 MG/ML IJ SOLN
INTRAMUSCULAR | Status: DC | PRN
  Administered 2018-02-19 (×6): 10 mg via INTRAVENOUS

## 2018-02-19 MED ORDER — KETOROLAC TROMETHAMINE 15 MG/ML IJ SOLN: 15.0000 mg | Freq: Once | INTRAMUSCULAR | Status: DC

## 2018-02-19 MED ORDER — ROCURONIUM BROMIDE 10 MG/ML (PF) SYRINGE
PREFILLED_SYRINGE | INTRAVENOUS | Status: AC
  Filled 2018-02-19: qty 5

## 2018-02-19 MED ORDER — ONDANSETRON HCL 4 MG/2ML IJ SOLN: 4.0000 mg | Freq: Once | INTRAMUSCULAR | Status: DC | PRN

## 2018-02-19 MED ORDER — IOPAMIDOL (ISOVUE-300) INJECTION 61%
INTRAVENOUS | Status: DC | PRN
  Administered 2018-02-19: 50 mL via INTRAVENOUS

## 2018-02-19 MED ORDER — PROPRANOLOL HCL 20 MG PO TABS
20.0000 mg | ORAL_TABLET | Freq: Every day | ORAL | Status: DC
  Administered 2018-02-20: 20 mg via ORAL
  Filled 2018-02-19: qty 1

## 2018-02-19 MED ORDER — SUGAMMADEX SODIUM 200 MG/2ML IV SOLN
INTRAVENOUS | Status: AC
Start: 2018-02-19 — End: ?
  Filled 2018-02-19: qty 2

## 2018-02-19 MED ORDER — HYDROMORPHONE HCL 1 MG/ML IJ SOLN
INTRAMUSCULAR | Status: AC
  Administered 2018-02-19: 0.25 mg via INTRAVENOUS
  Filled 2018-02-19: qty 1

## 2018-02-19 MED ORDER — SUGAMMADEX SODIUM 200 MG/2ML IV SOLN
INTRAVENOUS | Status: DC | PRN
  Administered 2018-02-19: 200 mg via INTRAVENOUS

## 2018-02-19 MED ORDER — PHENYLEPHRINE 40 MCG/ML (10ML) SYRINGE FOR IV PUSH (FOR BLOOD PRESSURE SUPPORT)
PREFILLED_SYRINGE | INTRAVENOUS | Status: AC
  Filled 2018-02-19: qty 10

## 2018-02-19 MED ORDER — FENTANYL CITRATE (PF) 250 MCG/5ML IJ SOLN
INTRAMUSCULAR | Status: AC
Start: 2018-02-19 — End: ?
  Filled 2018-02-19: qty 5

## 2018-02-19 MED ORDER — DIPHENHYDRAMINE HCL 12.5 MG/5ML PO ELIX: 12.5000 mg | ORAL_SOLUTION | Freq: Four times a day (QID) | ORAL | Status: DC | PRN

## 2018-02-19 MED ORDER — METHOCARBAMOL 500 MG PO TABS: 750.0000 mg | ORAL_TABLET | Freq: Four times a day (QID) | ORAL | Status: DC | PRN

## 2018-02-19 MED ORDER — METOPROLOL TARTRATE 5 MG/5ML IV SOLN: 5.0000 mg | Freq: Four times a day (QID) | INTRAVENOUS | Status: DC | PRN

## 2018-02-19 MED ORDER — ZOLPIDEM TARTRATE 5 MG PO TABS: 5.0000 mg | ORAL_TABLET | Freq: Every evening | ORAL | Status: DC | PRN

## 2018-02-19 MED ORDER — PHENYLEPHRINE HCL 10 MG/ML IJ SOLN
INTRAMUSCULAR | Status: AC
  Filled 2018-02-19: qty 1

## 2018-02-19 MED ORDER — LACTATED RINGERS IV SOLN: 1000.0000 mL | Freq: Three times a day (TID) | INTRAVENOUS | Status: DC | PRN

## 2018-02-19 MED ORDER — FUROSEMIDE 20 MG PO TABS
20.0000 mg | ORAL_TABLET | Freq: Every morning | ORAL | Status: DC
  Administered 2018-02-20: 20 mg via ORAL
  Filled 2018-02-19: qty 1

## 2018-02-19 MED ORDER — VITAMIN B-12 1000 MCG PO TABS
1000.0000 ug | ORAL_TABLET | Freq: Every day | ORAL | Status: DC
  Administered 2018-02-20: 1000 ug via ORAL
  Filled 2018-02-19: qty 1

## 2018-02-19 MED ORDER — ONE-DAILY MULTI VITAMINS PO TABS: 1.0000 | ORAL_TABLET | Freq: Every day | ORAL | Status: DC

## 2018-02-19 MED ORDER — BUPIVACAINE LIPOSOME 1.3 % IJ SUSP
20.0000 mL | Freq: Once | INTRAMUSCULAR | Status: AC
  Administered 2018-02-19: 20 mL
  Filled 2018-02-19: qty 20

## 2018-02-19 MED ORDER — DEXAMETHASONE SODIUM PHOSPHATE 10 MG/ML IJ SOLN
INTRAMUSCULAR | Status: DC | PRN
  Administered 2018-02-19: 5 mg via INTRAVENOUS

## 2018-02-19 MED ORDER — LIDOCAINE VISCOUS 2 % MT SOLN
15.0000 mL | Freq: Once | OROMUCOSAL | Status: AC
  Administered 2018-02-19: 15 mL via OROMUCOSAL
  Filled 2018-02-19: qty 15

## 2018-02-19 MED ORDER — ONDANSETRON HCL 4 MG/2ML IJ SOLN: 4.0000 mg | Freq: Four times a day (QID) | INTRAMUSCULAR | Status: DC | PRN

## 2018-02-19 MED ORDER — KETAMINE HCL 10 MG/ML IJ SOLN
INTRAMUSCULAR | Status: AC
  Filled 2018-02-19: qty 1

## 2018-02-19 MED ORDER — RACEPINEPHRINE HCL 2.25 % IN NEBU
0.5000 mL | INHALATION_SOLUTION | Freq: Once | RESPIRATORY_TRACT | Status: AC
  Administered 2018-02-19: 0.5 mL via RESPIRATORY_TRACT

## 2018-02-19 MED ORDER — PROPOFOL 10 MG/ML IV BOLUS
INTRAVENOUS | Status: AC
  Filled 2018-02-19: qty 20

## 2018-02-19 MED ORDER — GLYCOPYRROLATE 0.2 MG/ML IV SOSY
PREFILLED_SYRINGE | INTRAVENOUS | Status: AC
  Filled 2018-02-19: qty 5

## 2018-02-19 MED ORDER — DIPHENHYDRAMINE HCL 50 MG/ML IJ SOLN: 12.5000 mg | Freq: Four times a day (QID) | INTRAMUSCULAR | Status: DC | PRN

## 2018-02-19 MED ORDER — SUCCINYLCHOLINE CHLORIDE 200 MG/10ML IV SOSY
PREFILLED_SYRINGE | INTRAVENOUS | Status: DC | PRN
  Administered 2018-02-19: 100 mg via INTRAVENOUS

## 2018-02-19 SURGICAL SUPPLY — 43 items
APPLIER CLIP 5 13 M/L LIGAMAX5 (MISCELLANEOUS) ×4
CABLE HIGH FREQUENCY MONO STRZ (ELECTRODE) ×4 IMPLANT
CHLORAPREP W/TINT 26ML (MISCELLANEOUS) ×4 IMPLANT
CLIP APPLIE 5 13 M/L LIGAMAX5 (MISCELLANEOUS) ×2 IMPLANT
COVER MAYO STAND STRL (DRAPES) ×4 IMPLANT
COVER SURGICAL LIGHT HANDLE (MISCELLANEOUS) ×4 IMPLANT
DECANTER SPIKE VIAL GLASS SM (MISCELLANEOUS) ×4 IMPLANT
DRAIN CHANNEL 19F RND (DRAIN) IMPLANT
DRAPE C-ARM 42X120 X-RAY (DRAPES) ×4 IMPLANT
DRAPE WARM FLUID 44X44 (DRAPE) ×4 IMPLANT
DRSG TEGADERM 4X4.75 (GAUZE/BANDAGES/DRESSINGS) ×4 IMPLANT
DRSG TELFA 3X8 NADH (GAUZE/BANDAGES/DRESSINGS) ×4 IMPLANT
ELECT REM PT RETURN 15FT ADLT (MISCELLANEOUS) ×4 IMPLANT
ENDOLOOP SUT PDS II  0 18 (SUTURE) ×4
ENDOLOOP SUT PDS II 0 18 (SUTURE) ×4 IMPLANT
EVACUATOR SILICONE 100CC (DRAIN) IMPLANT
GAUZE SPONGE 2X2 8PLY STRL LF (GAUZE/BANDAGES/DRESSINGS) ×4 IMPLANT
GLOVE ECLIPSE 8.0 STRL XLNG CF (GLOVE) ×4 IMPLANT
GLOVE INDICATOR 8.0 STRL GRN (GLOVE) ×4 IMPLANT
GOWN STRL REUS W/TWL XL LVL3 (GOWN DISPOSABLE) ×8 IMPLANT
IRRIG SUCT STRYKERFLOW 2 WTIP (MISCELLANEOUS) ×4
IRRIGATION SUCT STRKRFLW 2 WTP (MISCELLANEOUS) ×2 IMPLANT
KIT BASIN OR (CUSTOM PROCEDURE TRAY) ×4 IMPLANT
NEEDLE BIOPSY 14X6 SOFT TISS (NEEDLE) ×4 IMPLANT
PAD POSITIONING PINK XL (MISCELLANEOUS) ×4 IMPLANT
POSITIONER SURGICAL ARM (MISCELLANEOUS) ×4 IMPLANT
POUCH RETRIEVAL ECOSAC 10 (ENDOMECHANICALS) ×2 IMPLANT
POUCH RETRIEVAL ECOSAC 10MM (ENDOMECHANICALS) ×2
POUCH SPECIMEN RETRIEVAL 10MM (ENDOMECHANICALS) IMPLANT
SCISSORS LAP 5X35 DISP (ENDOMECHANICALS) ×4 IMPLANT
SET CHOLANGIOGRAPH MIX (MISCELLANEOUS) ×4 IMPLANT
SHEARS HARMONIC ACE PLUS 36CM (ENDOMECHANICALS) ×4 IMPLANT
SLEEVE XCEL OPT CAN 5 100 (ENDOMECHANICALS) ×4 IMPLANT
SPONGE GAUZE 2X2 STER 10/PKG (GAUZE/BANDAGES/DRESSINGS) ×4
SUT MNCRL AB 4-0 PS2 18 (SUTURE) ×4 IMPLANT
SUT PDS AB 1 CT1 27 (SUTURE) ×12 IMPLANT
SYR 20CC LL (SYRINGE) ×4 IMPLANT
TOWEL OR 17X26 10 PK STRL BLUE (TOWEL DISPOSABLE) ×4 IMPLANT
TOWEL OR NON WOVEN STRL DISP B (DISPOSABLE) ×4 IMPLANT
TRAY LAPAROSCOPIC (CUSTOM PROCEDURE TRAY) ×4 IMPLANT
TROCAR BLADELESS OPT 5 100 (ENDOMECHANICALS) ×4 IMPLANT
TROCAR BLADELESS OPT 5 150 (ENDOMECHANICALS) ×4 IMPLANT
TUBING INSUF HEATED (TUBING) ×4 IMPLANT

## 2018-02-19 NOTE — Anesthesia Postprocedure Evaluation (Signed)
Anesthesia Post Note  Patient: RAFEEF [name]  Procedure(s) Performed: LAPAROSCOPIC CHOLECYSTECTOMY (N/A Abdomen) LIVER BIOPSY (Abdomen) REPAIR OF INCARCERATED UMBILICAL HERNIA (Abdomen)     Patient location during evaluation: PACU Anesthesia Type: General Level of consciousness: awake Pain management: pain level controlled Vital Signs Assessment: post-procedure vital signs reviewed and stable Respiratory status: spontaneous breathing Cardiovascular status: stable Postop Assessment: no apparent nausea or vomiting Anesthetic complications: no    Last Vitals:  Vitals:   02/19/18 1445 02/19/18 1500  BP:  (!) 94/58  Pulse: 77 74  Resp: 19 17  Temp:    SpO2: 100% 100%    Last Pain:  Vitals:   02/19/18 1500  TempSrc:   PainSc: 6    Pain Goal:                 Prescious Hurless JR,JOHN Pegge Cumberledge

## 2018-02-19 NOTE — Discharge Instructions (Signed)
LAPAROSCOPIC SURGERY: POST OP INSTRUCTIONS  ######################################################################  EAT Gradually transition to a high fiber diet with a fiber supplement over the next few weeks after discharge.  Start with a pureed / full liquid diet (see below)  WALK Walk an hour a day.  Control your pain to do that.    CONTROL PAIN Control pain so that you can walk, sleep, tolerate sneezing/coughing, go up/down stairs.  HAVE A BOWEL MOVEMENT DAILY Keep your bowels regular to avoid problems.  OK to try a laxative to override constipation.  OK to use an antidairrheal to slow down diarrhea.  Call if not better after 2 tries  CALL IF YOU HAVE PROBLEMS/CONCERNS Call if you are still struggling despite following these instructions. Call if you have concerns not answered by these instructions  ######################################################################    1. DIET: Follow a light bland diet the first 24 hours after arrival home, such as soup, liquids, crackers, etc.  Be sure to include lots of fluids daily.  Avoid fast food or heavy meals as your are more likely to get nauseated.  Eat a low fat the next few days after surgery.   2. Take your usually prescribed home medications unless otherwise directed. 3. PAIN CONTROL: a. Pain is best controlled by a usual combination of three different methods TOGETHER: i. Ice/Heat ii. Over the counter pain medication iii. Prescription pain medication b. Most patients will experience some swelling and bruising around the incisions.  Ice packs or heating pads (30-60 minutes up to 6 times a day) will help. Use ice for the first few days to help decrease swelling and bruising, then switch to heat to help relax tight/sore spots and speed recovery.  Some people prefer to use ice alone, heat alone, alternating between ice & heat.  Experiment to what works for you.  Swelling and bruising can take several weeks to resolve.   c. It is  helpful to take an over-the-counter pain medication regularly for the first few weeks.  Choose one of the following that works best for you: i. Naproxen (Aleve, etc)  Two 254m tabs twice a day ii. Ibuprofen (Advil, etc) Three 2087mtabs four times a day (every meal & bedtime) iii. Acetaminophen (Tylenol, etc) 500-65046mour times a day (every meal & bedtime) d. A  prescription for pain medication (such as oxycodone, hydrocodone, etc) should be given to you upon discharge.  Take your pain medication as prescribed.  i. If you are having problems/concerns with the prescription medicine (does not control pain, nausea, vomiting, rash, itching, etc), please call us [phone] see if we need to switch you to a different pain medicine that will work better for you and/or control your side effect better. ii. If you need a refill on your pain medication, please contact your pharmacy.  They will contact our office to request authorization. Prescriptions will not be filled after 5 pm or on week-ends. 4. Avoid getting constipated.  Between the surgery and the pain medications, it is common to experience some constipation.  Increasing fluid intake and taking a fiber supplement (such as Metamucil, Citrucel, FiberCon, MiraLax, etc) 1-2 times a day regularly will usually help prevent this problem from occurring.  A mild laxative (prune juice, Milk of Magnesia, MiraLax, etc) should be taken according to package directions if there are no bowel movements after 48 hours.   5. Watch out for diarrhea.  If you have many loose bowel movements, simplify your diet to bland foods & liquids for  a few days.  Stop any stool softeners and decrease your fiber supplement.  Switching to mild anti-diarrheal medications (Kayopectate, Pepto Bismol) can help.  If this worsens or does not improve, please call us. 6. Wash / shower every day.  You may shower over the dressings as they are waterproof.  Continue to shower over incision(s)  after the dressing is off. 7. Remove your waterproof bandages 5 days after surgery.  You may leave the incision open to air.  You may replace a dressing/Band-Aid to cover the incision for comfort if you wish.  8. ACTIVITIES as tolerated:   a. You may resume regular (light) daily activities beginning the next day--such as daily self-care, walking, climbing stairs--gradually increasing activities as tolerated.  If you can walk 30 minutes without difficulty, it is safe to try more intense activity such as jogging, treadmill, bicycling, low-impact aerobics, swimming, etc. b. Save the most intensive and strenuous activity for last such as sit-ups, heavy lifting, contact sports, etc  Refrain from any heavy lifting or straining until you are off narcotics for pain control.   c. DO NOT PUSH THROUGH PAIN.  Let pain be your guide: If it hurts to do something, don't do it.  Pain is your body warning you to avoid that activity for another week until the pain goes down. d. You may drive when you are no longer taking prescription pain medication, you can comfortably wear a seatbelt, and you can safely maneuver your car and apply brakes. e. Dennis Bast may have sexual intercourse when it is comfortable.  9. FOLLOW UP in our office a. Please call [facility] at [phone] to set up an appointment to see your surgeon in the office for a follow-up appointment approximately 2-3 weeks after your surgery. b. Make sure that you call for this appointment the day you arrive home to insure a convenient appointment time. 10. IF YOU HAVE DISABILITY OR FAMILY LEAVE FORMS, BRING THEM TO THE OFFICE FOR PROCESSING.  DO NOT GIVE THEM TO YOUR DOCTOR.   WHEN TO CALL us [phone]: 1. Poor pain control 2. Reactions / problems with new medications (rash/itching, nausea, etc)  3. Fever over 101.5 F (38.5 C) 4. Inability to urinate 5. Nausea and/or vomiting 6. Worsening swelling or bruising 7. Continued bleeding from incision. 8. Increased  pain, redness, or drainage from the incision   The clinic staff is available to answer your questions during regular business hours (8:30am-5pm).  Please dont hesitate to call and ask to speak to one of our nurses for clinical concerns.   If you have a medical emergency, go to the nearest emergency room or call 911.  A surgeon from [facility] is always on call at the [facility], [place], [place], [place], [place]  [address] ? MAIN: [phone] ? TOLL FREE: [phone] ?  FAX [phone] [email or web address]   Cholecystitis Cholecystitis is inflammation of the gallbladder. It is often called a gallbladder attack. The gallbladder is a pear-shaped organ that lies beneath the liver on the right side of the body. The gallbladder stores bile, which is a fluid that helps the body to digest fats. If bile builds up in your gallbladder, your gallbladder becomes inflamed. This condition may occur suddenly (be acute). Repeat episodes of acute cholecystitis or prolonged episodes may lead to a long-term (chronic) condition. Cholecystitis is serious and it requires treatment. What are the causes? The most common cause of this condition  is gallstones. Gallstones can block the tube (duct) that carries bile out of your gallbladder. This causes bile to build up. Other causes of this condition include:  Damage to the gallbladder due to a decrease in blood flow.  Infections in the bile ducts.  Scars or kinks in the bile ducts.  Tumors in the liver, pancreas, or gallbladder.  What increases the risk? This condition is more likely to develop in:  People who have sickle cell disease.  People who take birth control pills or use estrogen.  People who have alcoholic liver disease.  People who have liver cirrhosis.  People who have their nutrition delivered through a vein (parenteral nutrition).  People who do not eat or drink  (do fasting) for a long period of time.  People who are obese.  People who have rapid weight loss.  People who are pregnant.  People who have increased triglyceride levels.  People who have pancreatitis.  What are the signs or symptoms? Symptoms of this condition include:  Abdominal pain, especially in the upper right area of the abdomen.  Abdominal tenderness or bloating.  Nausea.  Vomiting.  Fever.  Chills.  Yellowing of the skin and the whites of the eyes (jaundice).  How is this diagnosed? This condition is diagnosed with a medical history and physical exam. You may also have other tests, including:  Imaging tests, such as: ? An ultrasound of the gallbladder. ? A CT scan of the abdomen. ? A gallbladder nuclear scan (HIDA scan). This scan allows your health care provider to see the bile moving from your liver to your gallbladder and to your small intestine. ? MRI.  Blood tests, such as: ? A complete blood count, because the white blood cell count may be higher than normal. ? Liver function tests, because some levels may be higher than normal with certain types of gallstones.  How is this treated? Treatment may include:  Fasting for a certain amount of time.  IV fluids.  Medicine to treat pain or vomiting.  Antibiotic medicine.  Surgery to remove your gallbladder (cholecystectomy). This may happen immediately or at a later time.  Follow these instructions at home: Home care will depend on your treatment. In general:  Take over-the-counter and prescription medicines only as told by your health care provider.  If you were prescribed an antibiotic medicine, take it as told by your health care provider. Do not stop taking the antibiotic even if you start to feel better.  Follow instructions from your health care provider about what to eat or drink. When you are allowed to eat, avoid eating or drinking anything that triggers your symptoms.  Keep all  follow-up visits as told by your health care provider. This is important.  Contact a health care provider if:  Your pain is not controlled with medicine.  You have a fever. Get help right away if:  Your pain moves to another part of your abdomen or to your back.  You continue to have symptoms or you develop new symptoms even with treatment. This information is not intended to replace advice given to you by your health care provider. Make sure you discuss any questions you have with your health care provider. Document Released: 12/10/2005 Document Revised: 04/19/2016 Document Reviewed: 03/23/2015 Elsevier Interactive Patient Education  2018 [employer].    Cirrhosis Cirrhosis is long-term (chronic) liver injury. The liver is your largest internal organ, and it performs many functions. The liver converts food into energy, removes toxic  material from your blood, makes important proteins, and absorbs necessary vitamins from your diet. If you have cirrhosis, it means many of your healthy liver cells have been replaced by scar tissue. This prevents blood from flowing through your liver, which makes it difficult for your liver to function. This scarring is not reversible, but treatment can prevent it from getting worse. What are the causes? Hepatitis C and long-term alcohol abuse are the most common causes of cirrhosis. Other causes include:  Nonalcoholic fatty liver disease.  Hepatitis B infection.  Autoimmune hepatitis.  Diseases that cause blockage of ducts inside the liver.  Inherited liver diseases.  Reactions to certain long-term medicines.  Parasitic infections.  Long-term exposure to certain toxins.  What increases the risk? You may have a higher risk of cirrhosis if you:  Have certain hepatitis viruses.  Abuse alcohol, especially if you are female.  Are overweight.  Share needles.  Have unprotected sex with someone who has hepatitis.  What are the signs or  symptoms? You may not have any signs and symptoms at first. Symptoms may not develop until the damage to your liver starts to get worse. Signs and symptoms of cirrhosis may include:  Tenderness in the right-upper part of your abdomen.  Weakness and tiredness (fatigue).  Loss of appetite.  Nausea.  Weight loss and muscle loss.  Itchiness.  Yellow skin and eyes (jaundice).  Buildup of fluid in the abdomen (ascites).  Swelling of the feet and ankles (edema).  Appearance of tiny blood vessels under the skin.  Mental confusion.  Easy bruising and bleeding.  How is this diagnosed? Your health care provider may suspect cirrhosis based on your symptoms and medical history, especially if you have other medical conditions or a history of alcohol abuse. Your health care provider will do a physical exam to feel your liver and check for signs of cirrhosis. Your health care provider may perform other tests, including:  Blood tests to check: ? Whether you have hepatitis B or C. ? Kidney function. ? Liver function.  Imaging tests such as: ? MRI or CT scan to look for changes seen in advanced cirrhosis. ? Ultrasound to see if normal liver tissue is being replaced by scar tissue.  A procedure using a long needle to take a sample of liver tissue (biopsy) for examination under a microscope. Liver biopsy can confirm the diagnosis of cirrhosis.  How is this treated? Treatment depends on how damaged your liver is and what caused the damage. Treatment may include treating cirrhosis symptoms or treating the underlying causes of the condition to try to slow the progression of the damage. Treatment may include:  Making lifestyle changes, such as: ? Eating a healthy diet. ? Restricting salt intake. ? Maintaining a healthy weight. ? Not abusing drugs or alcohol.  Taking medicines to: ? Treat liver infections or other infections. ? Control itching. ? Reduce fluid buildup. ? Reduce certain  blood toxins. ? Reduce risk of bleeding from enlarged blood vessels in the stomach or esophagus (varices).  If varices are causing bleeding problems, you may need treatment with a procedure that ties up the vessels causing them to fall off (band ligation).  If cirrhosis is causing your liver to fail, your health care provider may recommend a liver transplant.  Other treatments may be recommended depending on any complications of cirrhosis, such as liver-related kidney failure (hepatorenal syndrome).  Follow these instructions at home:  Take medicines only as directed by your health care provider.  Do not use drugs that are toxic to your liver. Ask your health care provider before taking any new medicines, including over-the-counter medicines.  Rest as needed.  Eat a well-balanced diet. Ask your health care provider or dietitian for more information.  You may have to follow a low-salt diet or restrict your water intake as directed.  Do not drink alcohol. This is especially important if you are taking acetaminophen.  Keep all follow-up visits as directed by your health care provider. This is important. Contact a health care provider if:  You have fatigue or weakness that is getting worse.  You develop swelling of the hands, feet, legs, or face.  You have a fever.  You develop loss of appetite.  You have nausea or vomiting.  You develop jaundice.  You develop easy bruising or bleeding. Get help right away if:  You vomit bright red blood or a material that looks like coffee grounds.  You have blood in your stools.  Your stools appear black and tarry.  You become confused.  You have chest pain or trouble breathing. This information is not intended to replace advice given to you by your health care provider. Make sure you discuss any questions you have with your health care provider. Document Released: 12/10/2005 Document Revised: 04/19/2016 Document Reviewed:  08/18/2014 Elsevier Interactive Patient Education  [employer].

## 2018-02-19 NOTE — Op Note (Signed)
02/19/2018  PATIENT:  Christina [name]  72 y.o. female  Patient Care Team: Gayland Curry, DO as PCP - General (Geriatric Medicine) Armbruster, [name], MD as Consulting Physician (Gastroenterology) [name], MD as Consulting Physician (General Surgery)  PRE-OPERATIVE DIAGNOSIS:    Chronic Calculus cholecystitis  Cirrhosis  POST-OPERATIVE DIAGNOSIS:   Chronic Calculus cholecystitis Cirrhosis  Umbilical hernia, incarcerated with omentum  PROCEDURE:    Laparoscopic cholecystectomy Reduction & primary repair of incarcerated umbilical hernia Core Liver Biopsy  SURGEON:  Adin Hector, MD, FACS.  ASSISTANT: OR Staff   ANESTHESIA:    General with endotracheal intubation Local anesthetic as a field block  EBL:  (See Anesthesia Intraoperative Record) Total I/O In: 1000 [I.V.:1000] Out: 325 [Blood:325]  Delay start of Pharmacological VTE agent (>24hrs) due to surgical blood loss or risk of bleeding:  no  DRAINS: None   SPECIMEN: Gallbladder & Core liver biopsies    DISPOSITION OF SPECIMEN:  PATHOLOGY  COUNTS:  YES  PLAN OF CARE: Admit for overnight observation  PATIENT DISPOSITION:  PACU - hemodynamically stable.  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  Intermittent episodes of biliary colic with probable chronic cholecystitis.  I recommended cholecystectomy.  Probable liver biopsy.  The anatomy & physiology of hepatobiliary & pancreatic function was discussed.  The pathophysiology of gallbladder dysfunction was discussed.  Natural history risks without surgery was discussed.   I feel the risks of no intervention will lead to serious problems that outweigh the operative risks; therefore, I recommended cholecystectomy to remove the pathology.  I explained laparoscopic techniques with possible need for an open approach.  Probable cholangiogram to evaluate the bilary tract was explained as well.    Risks such as  bleeding, infection, abscess, leak, injury to other organs, need for further treatment, heart attack, death, and other risks were discussed.  I noted a good likelihood this will help address the problem.  Possibility that this will not correct all abdominal symptoms was explained.  Goals of post-operative recovery were discussed as well.  We will work to minimize complications.  An educational handout further explaining the pathology and treatment options was given as well.  Questions were answered.  The patient expresses understanding & wishes to proceed with surgery.  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  Mild portal hypertension.  Thickened gallbladder with adhesions and thickened wall consistent with chronic calculus cholecystitis.  Small black most likely hemolytic gallstones.  Tissues very poor and friable.  Umbilical hernia 15 mm incarcerated with greater omentum.  Reduced and primarily repaired.  DESCRIPTION:   The patient was identified & brought in the operating room. The patient was positioned supine with arms tucked. SCDs were active during the entire case. The patient underwent general anesthesia without any difficulty.  The abdomen was prepped and draped in a sterile fashion. A Surgical Timeout confirmed our plan.  I made a transverse curvilinear incision through the superior umbilical fold.  I placed a 56m long port through the supraumbilical fascia using a modified Hassan cutdown technique with umbilical stalk fascial countertraction. I began carbon dioxide insufflation.  No change in end tidal CO2 measurement.   Camera inspection revealed no injury. There were no adhesions to the anterior abdominal wall supraumbilically.  I proceeded to continue with single site technique. I placed a #5 port in left upper aspect of the wound. I placed a 5 mm atraumatic grasper in the right inferior aspect of the wound.  I turned attention to the right upper  quadrant.  Gallbladder was distended and thickened with some mild adhesions.  The gallbladder fundus was elevated cephalad.  The dome of the gallbladder tore with leaking of bile but no stones.  Same thing happened upon grasping the infundibulum.  I aspirated the gallbladder to decompress it - no spillage of stones.  I freed adhesions to the ventral surface of the gallbladder off carefully.  I freed the peritoneal coverings between the gallbladder and the liver on the posteriolateral and anteriomedial walls. I alternated between Harmonic & blunt Maryland dissection to help get a good critical view of the cystic artery and cystic duct. I did further dissection to free most of the gallbladder off the liver bed to get a good critical view of the infundibulum and cystic duct. I dissected out the cystic artery; and, after getting a good 360 view, was about to ligate the anterior branch when it tore and caused brisk bleeding.  I was able to place a grasper on it.  I placed an additional port in the right midabdomen.  With this extra port, I get better isolate and ensure hemostasis.  I carefully dissected to confirm that the bleeding was coming off the anterior branch of the cystic artery.  I did ligation of the artery with with clips.  I dissected out the posterior branch of the cystic artery and placed clips on that as well.    Proceeded to take the gallbladder in a dome down positioning to help mobilize the gallbladder with less tension.  Freddrick March off the last few adhesions to the liver.  I skeletonized the omentum circumferentially and came more proximally down the cystic duct.  I placed a clip on the infundibulum. I did a partial cystic duct-otomy at the infundibulum.  Milked out an impacted cluster of tiny black gallstones out and ensured patency.  I placed a 5 [place] cholangiocatheter through a puncture site at the right subcostal ridge of the abdominal wall and directed it into the cystic duct.  However the tip  came through the mid cystic duct quite easily with minimal pressure, again consistent with very poor tissues.  Fluoroscopy confirmed this - not in the CBD.  I aborted on further cholangiography.  I carefully inspected around the cystic duct and irrigated the catheter to confirm a tiny pinhole leak in the mid cystic duct.  I removed the cholangiocatheter.   I ligated the cystic duct at the takeoff from the common bile duct using 0 PDS Endoloops x2 and clips.  Completed cystic duct transection of the infundibulum of the gallbladder.   I ensured hemostasis on the gallbladder fossa of the liver and elsewhere.  Hemostasis was good.  Because of her cirrhosis I used a 14-gauge Tru-Cut needle through a right subcostal puncture site and got 1 excellent into moderate cores from the anterior surface of the liver.  Assured hemostasis with spatula tip cautery.  I did irrigation and reinspection.  Hemostasis good liver.  Clips intact on the cystic arterial branches and cystic duct stump.  No evidence of any bile leak or bleeding.  Placed the gallbladder inside an eco-sac bag.  I inspected the rest of the abdomen & detected no injury nor bleeding elsewhere.  I removed the gallbladder out the supraumbilical fascia.   I confirmed a subtest mass at the umbilicus and freed the umbilical stalk off the fascia to reveal a moderate hernia sac incarcerated with greater omentum.  I freed the sac off  and reduce the greater omentum.  I assured hemostasis on it.  I closed the fascia at the gallbladder extraction site and umbilical hernia transversely using #1 PDS interrupted stitches, sparing a central bridge between the 2 areas to allow it to patch and cover up the repair.  I re-tacked the umbilical stalk with 0 Vicryl suture.. I closed the skin using 4-0 monocryl stitch.  Sterile dressing was applied. The patient was extubated & arrived in the PACU in stable condition..  I had discussed postoperative care with the patient in the  holding area. I discussed operative findings, updated the patient's status, discussed probable steps to recovery, and gave postoperative recommendations to the patient's family.  Recommendations were made.  Given her cirrhosis and other health issues, I will continue plan of watching her at least overnight.  Patient was disappointed and hoping to go home but family strongly agrees that she needs to stay as she has a history of some noncompliance requiring aggressive family supervision to help keep her liver and other all issues under better control.  Questions were answered.  They expressed understanding & appreciation.  Adin Hector, M.D., F.A.C.S. Gastrointestinal and Minimally Invasive Surgery [facility] [address] [place], [place] [address] [phone] Main / Paging  02/19/2018 1:53 PM

## 2018-02-19 NOTE — Anesthesia Preprocedure Evaluation (Addendum)
Anesthesia Evaluation  Patient identified by MRN, date of birth, ID band Patient awake    Reviewed: Allergy & Precautions, NPO status , Patient's Chart, lab work & pertinent test results, reviewed documented beta blocker date and time   Airway Mallampati: II       Dental  (+) Poor Dentition,    Pulmonary neg pulmonary ROS,    Pulmonary exam normal breath sounds clear to auscultation       Cardiovascular hypertension, Pt. on medications and Pt. on home beta blockers Normal cardiovascular exam Rhythm:Regular Rate:Normal     Neuro/Psych negative neurological ROS     GI/Hepatic negative GI ROS, Neg liver ROS,   Endo/Other  Hypothyroidism Morbid obesity  Renal/GU      Musculoskeletal   Abdominal (+) + obese,   Peds  Hematology   Anesthesia Other Findings   Reproductive/Obstetrics                            Anesthesia Physical Anesthesia Plan  ASA: III  Anesthesia Plan: General   Post-op Pain Management:    Induction: Intravenous  PONV Risk Score and Plan: 4 or greater and Ondansetron and Dexamethasone  Airway Management Planned: Oral ETT  Additional Equipment:   Intra-op Plan:   Post-operative Plan: Extubation in OR  Informed Consent: I have reviewed the patients History and Physical, chart, labs and discussed the procedure including the risks, benefits and alternatives for the proposed anesthesia with the patient or authorized representative who has indicated his/her understanding and acceptance.   Dental advisory given  Plan Discussed with: CRNA and Surgeon  Anesthesia Plan Comments:         Anesthesia Quick Evaluation

## 2018-02-19 NOTE — Anesthesia Procedure Notes (Signed)
Procedure Name: Intubation Date/Time: 02/19/2018 12:06 PM Performed by: Maxwell Caul, CRNA Pre-anesthesia Checklist: Patient identified, Emergency Drugs available, Suction available and Patient being monitored Patient Re-evaluated:Patient Re-evaluated prior to induction Oxygen Delivery Method: Circle system utilized Preoxygenation: Pre-oxygenation with 100% oxygen Induction Type: IV induction Ventilation: Mask ventilation without difficulty Laryngoscope Size: Mac and 3 Grade View: Grade I Tube type: Oral Tube size: 7.0 mm Number of attempts: 1 Airway Equipment and Method: Stylet Placement Confirmation: ETT inserted through vocal cords under direct vision,  positive ETCO2 and breath sounds checked- equal and bilateral Secured at: 21 cm Tube secured with: Tape Dental Injury: Teeth and Oropharynx as per pre-operative assessment

## 2018-02-19 NOTE — Interval H&P Note (Signed)
History and Physical Interval Note:  02/19/2018 11:04 AM  Christina [name]  has presented today for surgery, with the diagnosis of Symptomatic biliary colic, probable chronic cholecystitis. Cirrhosis  The various methods of treatment have been discussed with the patient and family. After consideration of risks, benefits and other options for treatment, the patient has consented to  Procedure(s): [place] (N/A) LAPAROSCOPIC CHOLECYSTECTOMY SINGLE SITE WITH INTRAOPERATIVE CHOLANGIOGRAM (N/A) as a surgical intervention .  The patient's history has been reviewed, patient examined, no change in status, stable for surgery.  I have reviewed the patient's chart and labs.  Questions were answered to the patient's satisfaction.    I have re-reviewed the the patient's records, history, medications, and allergies.  I have re-examined the patient.  I again discussed intraoperative plans and goals of post-operative recovery.  The patient agrees to proceed.  Christina [name]  04/29/1946 [id number]  Patient Care Team: Gayland Curry, DO as PCP - General (Geriatric Medicine) Armbruster, [name], MD as Consulting Physician (Gastroenterology) [name], MD as Consulting Physician (General Surgery)  Patient Active Problem List   Diagnosis Date Noted  . Closed compression fracture of L4 lumbar vertebra ([place]) 09/09/2017  . Psychophysiological insomnia 09/09/2017  . Seborrheic dermatitis of scalp 09/09/2017  . Depression, major, single episode, mild ([place]) 09/09/2017  . Alcoholic cirrhosis of liver with ascites ([place]) 09/09/2017  . History of breast cancer 09/09/2017  . B12 deficiency 09/09/2017  . Postoperative hypothyroidism 09/09/2017  . Esophageal varices in alcoholic cirrhosis ([place]) 24/26/8341  . Waldenstrom's macroglobulinemia ([place]) 09/09/2017  . Hyperlipidemia 09/09/2017    Past Medical History:  Diagnosis Date  . Abnormal EKG   . Abnormal finding on thyroid  function test   . Abnormal liver function test   . Acute edema   . Alcoholic cirrhosis ([place]) 96/22/2979   Possible NASH overlap (MELD 13)  . Anemia   . Anxiety   . Arthritis   . Ascites   . Back pain   . Breast mass   . Chronic low back pain   . Complete right rotator cuff tear   . Complication of anesthesia    during first surgery- woke up during surgery many years ago  . Compression fracture of L4 lumbar vertebra ([facility])   . Dermatitis, eczematoid   . Difficulty breathing   . Gallstones   . History of adenocarcinoma of breast   . Hypercholesterolemia   . Hyperkalemia   . Hypertension   . Hypokalemia   . Hypothyroidism   . Insomnia   . Knee pain   . Leukocytosis   . Lymphadenopathy   . Macrocytosis   . Memory loss or impairment   . Menopause   . MGUS (monoclonal gammopathy of unknown significance)   . Osteoarthritis   . Osteoarthritis of right knee   . Other cirrhosis of liver ([place])   . Renal insufficiency syndrome   . Right knee DJD   . Solitary thyroid nodule   . Unspecified lump in the left breast, unspecified quadrant   . Vitamin B 12 deficiency   . 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[place])     Past Surgical History:  Procedure Laterality Date  . BREAST SURGERY  2014   Removed lymphnodes  . EYE SURGERY  2006   bilateral cataract  . JOINT REPLACEMENT     right  (2017)and left knee  . LESION REMOVAL  09/2015   tubular adenoma-4 subcentimeter lesions  . right thumb surgery    .  TONSILLECTOMY    . TOTAL KNEE ARTHROPLASTY Right 09/05/2016    Social History   Socioeconomic History  . Marital status: Widowed    Spouse name: Not on file  . Number of children: 6  . Years of education: Not on file  . Highest education level: Not on file  Social Needs  . Financial resource strain: Not on file  . Food insecurity - worry: Not on file  . Food insecurity - inability: Not on file  . Transportation needs - medical: Not on file  . Transportation needs - non-medical:  Not on file  Occupational History  . Occupation: retired  Tobacco Use  . Smoking status: Never Smoker  . Smokeless tobacco: Never Used  Substance and Sexual Activity  . Alcohol use: No    Comment: 6 or more per day in the past.   . Drug use: No  . Sexual activity: Not Currently  Other Topics Concern  . Not on file  Social History Narrative   Social History       Diet? Regular      Do you drink/eat things with caffeine? 1 cup      Marital status?                        married            What year were you married?      Do you live in a house, apartment, assisted living, condo, trailer, etc.? townhouse      Is it one or more stories? no      How many persons live in your home? two      Do you have any pets in your home? (please list) no      Highest level of education completed? High school      Current or past profession: realtor      Do you exercise?      no                                Type & how often?      Advanced Directives      Do you have a living will? yes      Do you have a DNR form?      yes                            If not, do you want to discuss one?      Do you have signed POA/HPOA for forms? yes      Functional Status      Do you have difficulty bathing or dressing yourself? no      Do you have difficulty preparing food or eating? no      Do you have difficulty managing your medications? no      Do you have difficulty managing your finances? no      Do you have difficulty affording your medications? no    Family History  Problem Relation Age of Onset  . Breast cancer Mother   . Colon cancer Neg Hx   . Stomach cancer Neg Hx   . Rectal cancer Neg Hx   . Liver cancer Neg Hx   . Esophageal cancer Neg Hx     Medications Prior to Admission  Medication Sig Dispense Refill Last Dose  . buPROPion (  WELLBUTRIN XL) 150 MG 24 hr tablet TAKE ONE TABLET BY MOUTH DAILY 30 tablet 3 02/19/2018 at 0730  . Cholecalciferol (VITAMIN D3) 2000 units TABS  Take 1 tablet by mouth every morning.   02/18/2018 at Unknown time  . citalopram (CELEXA) 40 MG tablet Take 1 tablet (40 mg total) by mouth daily. 90 tablet 3 02/19/2018 at 0730  . furosemide (LASIX) 20 MG tablet Take 1 tablet (20 mg total) by mouth every morning. 90 tablet 3 Taking  . ibuprofen (ADVIL,MOTRIN) 200 MG tablet Take 400 mg by mouth daily as needed (for pain.).    Past Month at 0930  . ketoconazole (NIZORAL) 2 % shampoo Apply 1 application topically 2 (two) times a week. 120 mL 3 02/18/2018 at Unknown time  . lactulose (CHRONULAC) 10 GM/15ML solution Take 10 g by mouth daily.   02/18/2018 at 0930  . levothyroxine (SYNTHROID, LEVOTHROID) 88 MCG tablet TAKE 1 TABLET BY MOUTH ONCE A  DAY 30 MINUTES BEFORE BREAKFAST AND BEFORE ANY OTHER MEDICATIONS 90 tablet 0 02/19/2018 at 0730  . Melatonin 10 MG TABS Take one tablet by mouth once daily at bedtime for rest (Patient taking differently: Take 10 mg by mouth at bedtime as needed (for sleep.). ) 30 tablet 0 Past Week at Unknown time  . Multiple Vitamin (MULTIVITAMIN) tablet Take 1 tablet by mouth daily.   02/18/2018 at Unknown time  . oxyCODONE (ROXICODONE) 5 MG immediate release tablet Take 1 tablet (5 mg total) by mouth every 6 (six) hours as needed for severe pain. 10 tablet 0 Past Month at Unknown time  . potassium chloride (K-DUR) 10 MEQ tablet TAKE ONE TABLET BY MOUTH DAILY 60 tablet 3 02/18/2018 at 0930  . propranolol (INDERAL) 20 MG tablet Take 1 tablet (20 mg total) by mouth every morning. 90 tablet 3 02/19/2018 at 0730  . spironolactone (ALDACTONE) 100 MG tablet Take 100 mg by mouth every morning.   02/18/2018 at 0930  . traMADol (ULTRAM) 50 MG tablet Take 25 mg by mouth daily as needed (for pain.).    Past Month at Unknown time  . vitamin B-12 (CYANOCOBALAMIN) 1000 MCG tablet Take 1,000 mcg by mouth every morning.   02/18/2018 at 0930    Current Facility-Administered Medications  Medication Dose Route Frequency Provider Last Rate Last Dose  .  lactated ringers infusion   Intravenous Continuous Roberts Gaudy, MD 50 mL/hr at 02/19/18 1029       No Known Allergies  BP 119/62   Pulse 68   Temp 98.4 F (36.9 C) (Oral)   Resp 16   Ht 4' 9"  (1.448 m)   Wt 81.2 kg (179 lb)   SpO2 99%   BMI 38.74 kg/m   Labs: No results found for this or any previous visit (from the past 48 hour(s)).  Imaging / Studies: No results found.   Adin Hector, M.D., F.A.C.S. Gastrointestinal and Minimally Invasive Surgery [facility] [address] [place], [place] [address] [phone] Main / Paging  02/19/2018 11:04 AM    Adin Hector

## 2018-02-19 NOTE — Transfer of Care (Signed)
Immediate Anesthesia Transfer of Care Note  Patient: Christina [name]  Procedure(s) Performed: LAPAROSCOPIC CHOLECYSTECTOMY (N/A Abdomen) LIVER BIOPSY (Abdomen) REPAIR OF INCARCERATED UMBILICAL HERNIA (Abdomen)  Patient Location: PACU  Anesthesia Type:General  Level of Consciousness: awake, alert  and oriented  Airway & Oxygen Therapy: Patient Spontanous Breathing and Patient connected to face mask oxygen  Post-op Assessment: Report given to RN and Post -op Vital signs reviewed and stable  Post vital signs: Reviewed and stable  Last Vitals:  Vitals:   02/19/18 1000  BP: 119/62  Pulse: 68  Resp: 16  Temp: 36.9 C  SpO2: 99%    Last Pain:  Vitals:   02/19/18 1000  TempSrc: Oral         Complications: No apparent anesthesia complications

## 2018-02-20 ENCOUNTER — Telehealth: Payer: Self-pay | Admitting: Internal Medicine

## 2018-02-20 DIAGNOSIS — K429 Umbilical hernia without obstruction or gangrene: Secondary | ICD-10-CM | POA: Diagnosis not present

## 2018-02-20 DIAGNOSIS — Z853 Personal history of malignant neoplasm of breast: Secondary | ICD-10-CM | POA: Diagnosis not present

## 2018-02-20 DIAGNOSIS — F329 Major depressive disorder, single episode, unspecified: Secondary | ICD-10-CM | POA: Diagnosis not present

## 2018-02-20 DIAGNOSIS — K828 Other specified diseases of gallbladder: Secondary | ICD-10-CM | POA: Diagnosis not present

## 2018-02-20 DIAGNOSIS — K8010 Calculus of gallbladder with chronic cholecystitis without obstruction: Secondary | ICD-10-CM | POA: Diagnosis not present

## 2018-02-20 DIAGNOSIS — Z79899 Other long term (current) drug therapy: Secondary | ICD-10-CM | POA: Diagnosis not present

## 2018-02-20 LAB — COMPREHENSIVE METABOLIC PANEL
ALK PHOS: 70 U/L (ref 38–126)
ALT: 51 U/L (ref 14–54)
AST: 70 U/L — AB (ref 15–41)
Albumin: 3.3 g/dL — ABNORMAL LOW (ref 3.5–5.0)
Anion gap: 7 (ref 5–15)
BILIRUBIN TOTAL: 0.8 mg/dL (ref 0.3–1.2)
BUN: 18 mg/dL (ref 6–20)
CALCIUM: 8.7 mg/dL — AB (ref 8.9–10.3)
CO2: 25 mmol/L (ref 22–32)
Chloride: 102 mmol/L (ref 101–111)
Creatinine, Ser: 0.78 mg/dL (ref 0.44–1.00)
GFR calc Af Amer: >60 mL/min (ref >60)
Glucose, Bld: 151 mg/dL — ABNORMAL HIGH (ref 65–99)
POTASSIUM: 4.9 mmol/L (ref 3.5–5.1)
Sodium: 134 mmol/L — ABNORMAL LOW (ref 135–145)
TOTAL PROTEIN: 6.4 g/dL — AB (ref 6.5–8.1)

## 2018-02-20 LAB — CBC
HEMATOCRIT: 29 % — AB (ref 36.0–46.0)
HEMOGLOBIN: 9.7 g/dL — AB (ref 12.0–15.0)
MCH: 31.6 pg (ref 26.0–34.0)
MCHC: 33.4 g/dL (ref 30.0–36.0)
MCV: 94.5 fL (ref 78.0–100.0)
Platelets: 169 10*3/uL (ref 150–400)
RBC: 3.07 MIL/uL — ABNORMAL LOW (ref 3.87–5.11)
RDW: 12.7 % (ref 11.5–15.5)
WBC: 8.8 10*3/uL (ref 4.0–10.5)

## 2018-02-20 LAB — AMMONIA: AMMONIA: 19 umol/L (ref 9–35)

## 2018-02-20 MED ORDER — SPIRONOLACTONE 25 MG PO TABS
100.0000 mg | ORAL_TABLET | Freq: Every day | ORAL | Status: DC
  Administered 2018-02-20: 100 mg via ORAL
  Filled 2018-02-20: qty 4

## 2018-02-20 MED ORDER — POTASSIUM CHLORIDE CRYS ER 10 MEQ PO TBCR
10.0000 meq | EXTENDED_RELEASE_TABLET | Freq: Every day | ORAL | Status: DC
  Administered 2018-02-20: 10 meq via ORAL
  Filled 2018-02-20: qty 1

## 2018-02-20 MED ORDER — CITALOPRAM HYDROBROMIDE 20 MG PO TABS
40.0000 mg | ORAL_TABLET | Freq: Every day | ORAL | Status: DC
  Administered 2018-02-20: 40 mg via ORAL
  Filled 2018-02-20: qty 2

## 2018-02-20 NOTE — Discharge Summary (Signed)
Physician Discharge Summary  Patient ID: AMIYA ESCAMILLA MRN: [id number] DOB/AGE: 1946-03-28  72 y.o.  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 Cirrhosis  SURGERY:  02/19/2018  Procedure(s): LAPAROSCOPIC CHOLECYSTECTOMY LIVER BIOPSY REPAIR OF INCARCERATED UMBILICAL HERNIA  SURGEON:    Surgeon(s): [name], MD  Consults: None  Hospital Course:   The patient underwent the surgery above.  Postoperatively, the patient gradually mobilized and advanced to a solid diet.  Pain and other symptoms were treated aggressively.    By the time of discharge, the patient was walking well the hallways, eating food, having flatus.  Pain was well-controlled on an oral medications.  Based on meeting discharge criteria and continuing to recover, I felt it was safe for the patient to be discharged from the hospital to further recover with close followup. Postoperative recommendations were discussed in detail.  They are written as well.  Discharged Condition: good  Disposition:  Follow-up Information    [name], MD. Schedule an appointment as soon as possible for a visit in 3 weeks.   Specialty:  General Surgery Why:  To follow up after your operation Contact information: [address] [address] [phone]           01-Home or Self  Care  Discharge Instructions    Call MD for:   Complete by:  As directed    FEVER >101.5 F (Temperatures <101.51F occasionally happen and are not significant)   Call MD for:  extreme fatigue   Complete by:  As directed    Call MD for:  persistant dizziness or light-headedness   Complete by:  As directed    Call MD for:  persistant nausea and vomiting   Complete by:  As directed    Call MD for:  redness, tenderness, or signs of infection (pain, swelling, redness, odor or green/yellow discharge around incision site)   Complete by:  As directed    Call MD for:  severe uncontrolled pain   Complete by:  As directed    Diet - low sodium heart healthy   Complete by:  As directed    Follow a light diet the first few days at home.  Start with a bland diet such as soups, liquids, starchy foods, low fat foods, etc.   If you feel full, bloated, or constipated, stay on a full liquid or pureed/blenderized diet for a few days until you feel better and no longer constipated. Gradually get back to a regular solid diet.  Avoid fast food or heavy meals the first week as you are more likely to get nauseated.   Discharge instructions   Complete by:  As directed    One the day of your discharge from the hospital (or the next business weekday), please call [facility] to set up or confirm an appointment to see your surgeon in the office for a follow-up appointment.  Usually it is 2-3 weeks after your surgery.  Other  concerns If you are not getting better after two weeks or are noticing you are getting worse, contact our office [phone] for further advice.  We may need to adjust your medications, re-evaluate you in the office, send you to the emergency room, or see what other things we can do to help. The clinic staff is available to answer your questions during regular business hours (8:30am-5pm).  Please don't hesitate to call and ask to speak to one of our nurses for clinical concerns.    A  surgeon from [facility] is always on call at the hospitals 24 hours/day If you have a medical emergency, go to the nearest emergency room or call 911.   Driving Restrictions   Complete by:  As directed    You may drive when you are no longer taking prescription pain medication, you can comfortably wear a seatbelt, and you can safely maneuver your car and apply brakes.   Increase activity slowly   Complete by:  As directed    Lifting restrictions   Complete by:  As directed    You may resume regular (light) daily activities beginning the next day-such as daily self-care, walking, climbing stairs-gradually increasing activities as tolerated.   If you can walk 30 minutes without difficulty, it is safe to try more intense activity such as jogging, treadmill, bicycling, low-impact aerobics, swimming, etc. Save the most intensive and strenuous activity for last such as sit-ups, heavy lifting, contact sports, etc   Refrain from any heavy lifting or straining until you are off narcotics for pain control.   DO NOT PUSH THROUGH PAIN.   Let pain be your guide: If it hurts to do something, don't do it.   Pain is your body warning you to avoid that activity for another week until the pain goes down.   May shower / Bathe   Complete by:  As directed    Wash / shower every day.  You may shower over the dressings as they are waterproof.  Continue to shower over incision(s) after the dressing is off.   May walk up steps   Complete by:  As directed    Remove dressing in 72 hours   Complete by:  As directed    Remove your waterproof dressings, skin tapes, and other bandages 3-5 days after surgery.   You may leave the incision(s) open to air.   You may replace a dressing/Band-Aid to cover the incision for comfort if you wish.   Sexual Activity Restrictions   Complete by:  As directed    You may have sexual intercourse when it is comfortable. If it hurts to do something, stop.      Allergies  as of 02/20/2018   No Known Allergies     Medication List    TAKE these medications   buPROPion 150 MG 24 hr tablet Commonly known as:  WELLBUTRIN XL TAKE ONE TABLET BY MOUTH DAILY   citalopram 40 MG tablet Commonly known as:  CELEXA Take 1 tablet (40 mg total) by mouth daily.   furosemide 20 MG tablet Commonly known as:  LASIX Take 1 tablet (20 mg total) by mouth every morning.   ibuprofen 200 MG tablet Commonly known as:  ADVIL,MOTRIN Take 400 mg by mouth daily as needed (for pain.).   ketoconazole 2 % shampoo Commonly known as:  NIZORAL Apply 1 application topically 2 (two) times a week.   lactulose 10 GM/15ML solution Commonly known as:  CHRONULAC Take 10  g by mouth daily.   levothyroxine 88 MCG tablet Commonly known as:  SYNTHROID, LEVOTHROID TAKE 1 TABLET BY MOUTH ONCE A  DAY 30 MINUTES BEFORE BREAKFAST AND BEFORE ANY OTHER MEDICATIONS   Melatonin 10 MG Tabs Take one tablet by mouth once daily at bedtime for rest What changed:    how much to take  how to take this  when to take this  reasons to take this  additional instructions   multivitamin tablet Take 1 tablet by mouth daily.   oxyCODONE 5 MG immediate release tablet Commonly known as:  ROXICODONE Take 1-2 tablets (5-10 mg total) by mouth every 6 (six) hours as needed for severe pain. What changed:  how much to take   potassium chloride 10 MEQ tablet Commonly known as:  K-DUR TAKE ONE TABLET BY MOUTH DAILY   propranolol 20 MG tablet Commonly known as:  INDERAL Take 1 tablet (20 mg total) by mouth every morning.   spironolactone 100 MG tablet Commonly known as:  ALDACTONE Take 100 mg by mouth every morning.   traMADol 50 MG tablet Commonly known as:  ULTRAM Take 25 mg by mouth daily as needed (for pain.).   vitamin B-12 1000 MCG tablet Commonly known as:  CYANOCOBALAMIN Take 1,000 mcg by mouth every morning.   Vitamin D3 2000 units Tabs Take 1 tablet by mouth every morning.        Significant Diagnostic Studies:  Results for orders placed or performed during the hospital encounter of 02/19/18 (from the past 72 hour(s))  CBC     Status: Abnormal   Collection Time: 02/20/18  4:19 AM  Result Value Ref Range   WBC 8.8 4.0 - 10.5 K/uL   RBC 3.07 (L) 3.87 - 5.11 MIL/uL   Hemoglobin 9.7 (L) 12.0 - 15.0 g/dL   HCT 29.0 (L) 36.0 - 46.0 %   MCV 94.5 78.0 - 100.0 fL   MCH 31.6 26.0 - 34.0 pg   MCHC 33.4 30.0 - 36.0 g/dL   RDW 12.7 11.5 - 15.5 %   Platelets 169 150 - 400 K/uL    Comment: Performed at [facility], [place] [address], [place], [place] [address]  Comprehensive metabolic panel     Status: Abnormal   Collection Time: 02/20/18  4:19 AM  Result Value Ref Range   Sodium 134 (L) 135 - 145 mmol/L   Potassium 4.9 3.5 - 5.1 mmol/L   Chloride 102 101 - 111 mmol/L   CO2 25 22 - 32 mmol/L   Glucose, Bld 151 (H) 65 - 99 mg/dL   BUN 18 6 - 20 mg/dL   Creatinine, Ser 0.78 0.44 - 1.00 mg/dL   Calcium 8.7 (L) 8.9 - 10.3 mg/dL   Total Protein 6.4 (L) 6.5 - 8.1 g/dL   Albumin 3.3 (L) 3.5 - 5.0 g/dL   AST 70 (H) 15 - 41 U/L   ALT 51 14 - 54 U/L   Alkaline Phosphatase 70 38 - 126 U/L   Total Bilirubin 0.8 0.3 - 1.2 mg/dL   GFR calc non Af Amer >60 >60 mL/min   GFR calc Af Amer >60 >60 mL/min    Comment: (NOTE) The eGFR has been calculated using the CKD EPI equation. This calculation has not been validated in all clinical situations. eGFR's persistently <60 mL/min signify possible Chronic Kidney Disease.    Anion gap 7 5 - 15    Comment: Performed at [facility], [place] [address] [place],  [place] [address]  Ammonia     Status: None   Collection Time: 02/20/18  4:19 AM  Result Value Ref Range   Ammonia 19 9 - 35 umol/L    Comment: Performed at [facility], [place] [address] [place], [place] [address]    Dg Cholangiogram Operative  Result Date: 02/19/2018 CLINICAL DATA:  Symptomatic biliary colic,  cirrhosis, chronic cholecystitis EXAM: INTRAOPERATIVE CHOLANGIOGRAM TECHNIQUE: Cholangiographic images from the C-arm fluoroscopic device were submitted for interpretation post-operatively. Please see the procedural report for the amount of contrast and the fluoroscopy time utilized. COMPARISON:  01/08/2018 FINDINGS: Intraoperative cholangiogram attempted during the procedure. There is contrast extravasation along the cholecystectomy clips. Contrast does not opacify the biliary tree to confirm patency. IMPRESSION: Nondiagnostic unsuccessful intraoperative cholangiogram Electronically Signed   By: Jerilynn Mages.  Shick M.D.   On: 02/19/2018 13:47    Discharge Exam: Blood pressure 101/69, pulse 73, temperature 98.6 F (37 C), temperature source Oral, resp. rate 18, height 4' 9"  (1.448 m), weight 81.2 kg (179 lb), SpO2 97 %.  General: Pt awake/alert/oriented x4 in No acute distress Eyes: PERRL, normal EOM.  Sclera clear.  No icterus Neuro: CN II-XII intact w/o focal sensory/motor deficits. Lymph: No head/neck/groin lymphadenopathy Psych:  No delerium/psychosis/paranoia HENT: Normocephalic, Mucus membranes moist.  No thrush Neck: Supple, No tracheal deviation Chest: No chest wall pain w good excursion CV:  Pulses intact.  Regular rhythm MS: Normal AROM mjr joints.  No obvious deformity Abdomen: Soft.  Nondistended.  Mildly tender at incisions only.  No evidence of peritonitis.  No incarcerated hernias. Ext:  SCDs BLE.  No mjr edema.  No cyanosis Skin: No petechiae / purpura  Past Medical History:  Diagnosis Date  . Abnormal EKG   . Abnormal finding on thyroid function test   . Abnormal liver function test   . Acute edema   . 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 Anemia   . Anxiety   . Arthritis   . Ascites   . Back pain   . Breast mass   . Chronic low back pain   . Complete right rotator cuff tear   . Complication of anesthesia    during first surgery- woke  up during surgery many years ago  . Compression fracture of L4 lumbar vertebra (H[facility])   . Dermatitis, eczematoid   . Difficulty breathing   . Gallstones   . History of adenocarcinoma of breast   . Hypercholesterolemia   . Hyperkalemia   . Hypertension   . Hypokalemia   . Hypothyroidism   . Insomnia   . Knee pain   . Leukocytosis   . Lymphadenopathy   . Macrocytosis   . Memory loss or impairment   . Menopause   . MGUS (monoclonal gammopathy of unknown significance)   . Osteoarthritis   . Osteoarthritis of right knee   . Other cirrhosis of liver ([place])   . Renal insufficiency syndrome   . Right knee DJD   . Solitary thyroid nodule   . Unspecified lump in the left breast, unspecified quadrant   . Vitamin B 12 deficiency   . 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[place])     Past Surgical History:  Procedure Laterality Date  . BREAST SURGERY  2014   Removed lymphnodes  . EYE SURGERY  2006   bilateral cataract  . JOINT REPLACEMENT     right  (2017)and left knee  . LAPAROSCOPIC CHOLECYSTECTOMY SINGLE SITE WITH INTRAOPERATIVE CHOLANGIOGRAM N/A  02/19/2018   Procedure: LAPAROSCOPIC CHOLECYSTECTOMY;  Surgeon: [name], MD;  Location: [facility];  Service: General;  Laterality: N/A;  . LESION REMOVAL  09/2015   tubular adenoma-4 subcentimeter lesions  . LIVER BIOPSY  02/19/2018   Procedure: LIVER BIOPSY;  Surgeon: [name], MD;  Location: [facility];  Service: General;;  . right thumb surgery    . TONSILLECTOMY    . TOTAL KNEE ARTHROPLASTY Right 09/05/2016  . UMBILICAL HERNIA REPAIR  02/19/2018   Procedure: REPAIR OF INCARCERATED UMBILICAL HERNIA;  Surgeon: [name], MD;  Location: [facility];  Service: General;;    Social History   Socioeconomic History  . Marital status: Widowed    Spouse name: Not on file  . Number of children: 6  . Years of education: Not on file  . Highest education level: Not on file  Social Needs  . Financial resource strain: Not on file  . Food insecurity  - worry: Not on file  . Food insecurity - inability: Not on file  . Transportation needs - medical: Not on file  . Transportation needs - non-medical: Not on file  Occupational History  . Occupation: retired  Tobacco Use  . Smoking status: Never Smoker  . Smokeless tobacco: Never Used  Substance and Sexual Activity  . Alcohol use: No    Comment: 6 or more per day in the past.   . Drug use: No  . Sexual activity: Not Currently  Other Topics Concern  . Not on file  Social History Narrative   Social History       Diet? Regular      Do you drink/eat things with caffeine? 1 cup      Marital status?                        married            What year were you married?      Do you live in a house, apartment, assisted living, condo, trailer, etc.? townhouse      Is it one or more stories? no      How many persons live in your home? two      Do you have any pets in your home? (please list) no      Highest level of education completed? High school      Current or past profession: realtor      Do you exercise?      no                                Type & how often?      Advanced Directives      Do you have a living will? yes      Do you have a DNR form?      yes                            If not, do you want to discuss one?      Do you have signed POA/HPOA for forms? yes      Functional Status      Do you have difficulty bathing or dressing yourself? no      Do you have difficulty preparing food or eating? no      Do you have difficulty managing your medications? no  Do you have difficulty managing your finances? no      Do you have difficulty affording your medications? no    Family History  Problem Relation Age of Onset  . Breast cancer Mother   . Colon cancer Neg Hx   . Stomach cancer Neg Hx   . Rectal cancer Neg Hx   . Liver cancer Neg Hx   . Esophageal cancer Neg Hx     Current Facility-Administered Medications  Medication Dose Route Frequency  Provider Last Rate Last Dose  . acetaminophen (TYLENOL) tablet 500 mg  500 mg Oral Q4H PRN [name], MD      . bisacodyl (DULCOLAX) suppository 10 mg  10 mg Rectal Daily PRN [name], MD      . buPROPion (WELLBUTRIN XL) 24 hr tablet 150 mg  150 mg Oral Daily [name], MD      . cholecalciferol (VITAMIN D) tablet 2,000 Units  2,000 Units Oral Daily [name], MD      . citalopram (CELEXA) tablet 40 mg  40 mg Oral Daily [name], MD      . diphenhydrAMINE (BENADRYL) 12.5 MG/5ML elixir 12.5 mg  12.5 mg Oral Q6H PRN [name], MD       Or  . diphenhydrAMINE (BENADRYL) injection 12.5 mg  12.5 mg Intravenous Q6H PRN [name], MD      . enoxaparin (LOVENOX) injection 40 mg  40 mg Subcutaneous Q24H [name], MD   40 mg at 02/20/18 0756  . furosemide (LASIX) tablet 20 mg  20 mg Oral q morning - 10a [name], MD      . hydrALAZINE (APRESOLINE) injection 5-20 mg  5-20 mg Intravenous Q4H PRN [name], MD      . HYDROmorphone (DILAUDID) injection 0.5-2 mg  0.5-2 mg Intravenous Q2H PRN [name], MD   1 mg at 02/20/18 0459  . ibuprofen (ADVIL,MOTRIN) tablet 400 mg  400 mg Oral Daily PRN [name], MD      . ketoconazole (NIZORAL) 2 % shampoo 1 application  1 application Topical Once per day on Mon Thu Shaylah Mcghie, MD      . lactated ringers infusion 1,000 mL  1,000 mL Intravenous Q8H PRN [name], MD      . lactulose ([place]) 10 GM/15ML solution 10 g  10 g Oral Daily [name], MD      . levothyroxine (SYNTHROID, LEVOTHROID) tablet 88 mcg  88 mcg Oral QAC breakfast [name], MD   88 mcg at 02/20/18 0756  . Melatonin TABS 10 mg  10 mg Oral QHS PRN [name], MD      . methocarbamol (ROBAXIN) tablet 750 mg  750 mg Oral Q6H PRN [name], MD      . metoprolol tartrate (LOPRESSOR) injection 5 mg  5 mg Intravenous Q6H PRN [name], MD      . multivitamin with minerals tablet 1 tablet  1 tablet Oral Daily [name], MD       . ondansetron (ZOFRAN-ODT) disintegrating tablet 4 mg  4 mg Oral Q6H PRN [name], MD       Or  . ondansetron (ZOFRAN) injection 4 mg  4 mg Intravenous Q6H PRN [name], MD      . oxyCODONE (Oxy IR/ROXICODONE) immediate release tablet 5-10 mg  5-10 mg Oral Q4H PRN [name], MD      . polyethylene glycol (MIRALAX / GLYCOLAX) packet 17 g  17 g Oral Daily PRN [name],  MD      . potassium chloride (K-DUR,KLOR-CON) CR tablet 10 mEq  10 mEq Oral Daily [name], MD      . prochlorperazine (COMPAZINE) tablet 10 mg  10 mg Oral Q6H PRN [name], MD       Or  . prochlorperazine (COMPAZINE) injection 5-10 mg  5-10 mg Intravenous Q6H PRN [name], MD      . propranolol (INDERAL) tablet 20 mg  20 mg Oral Daily [name], MD      . simethicone [facility]) chewable tablet 40 mg  40 mg Oral Q6H PRN [name], MD      . spironolactone (ALDACTONE) tablet 100 mg  100 mg Oral Daily [name], MD      . traMADol Veatrice Bourbon) tablet 50 mg  50 mg Oral Q6H PRN [name], MD      . vitamin B-12 (CYANOCOBALAMIN) tablet 1,000 mcg  1,000 mcg Oral Daily [name], MD      . zolpidem (AMBIEN) tablet 5 mg  5 mg Oral QHS PRN [name], MD         No Known Allergies  Signed: Morton Peters, M.D., F.A.C.S. Gastrointestinal and Minimally Invasive Surgery [facility] [address] [place], [place] [address] [phone] Main / Paging   02/20/2018, 8:03 AM

## 2018-02-20 NOTE — Evaluation (Signed)
Occupational Therapy Evaluation Patient Details Name: GEORGIAN MCCLORY MRN: [id number] DOB: 11/19/1946 Today's Date: 02/20/2018    History of Present Illness s/p lap chole and hernia repair.  H/o cirrhosis, depression, breast and thyroid CA   Clinical Impression   This 72 year old female was admitted for the above sx. She lives alone and is very independent.  Pt needed min A at this time for LB adls. Educated on AE to decrease strain while she heals. Recommend HHOT if another service is present to work for IADLs.      Follow Up Recommendations  Home health OT    Equipment Recommendations  None recommended by OT    Recommendations for Other Services       Precautions / Restrictions Precautions Precautions: Fall Precaution Comments: abd sx Restrictions Weight Bearing Restrictions: No      Mobility Bed Mobility Overal bed mobility: Modified Independent             General bed mobility comments: educated on rolling technique; pt partially did this; did not use rail  Transfers Overall transfer level: Needs assistance Equipment used: Rolling walker (2 wheeled) Transfers: Sit to/from Stand Sit to Stand: Supervision              Balance                                           ADL either performed or assessed with clinical judgement   ADL Overall ADL's : Needs assistance/impaired Eating/Feeding: Independent   Grooming: Wash/dry hands;Supervision/safety;Standing   Upper Body Bathing: Set up;Sitting   Lower Body Bathing: Minimal assistance;Sit to/from stand   Upper Body Dressing : Set up;Sitting   Lower Body Dressing: Minimal assistance;Sit to/from stand   Toilet Transfer: Supervision/safety;Ambulation;Comfort height toilet;RW   Toileting- Clothing Manipulation and Hygiene: Supervision/safety;Sit to/from stand         General ADL Comments: pt has a Secondary school teacher at home. Educated on ADL; uses and educated on long sponge and sock aide.  She practiced with sock aide as she struggles with L sock all the time.  Pt was able to use this with RUE.  Pt is used to being independent and tends to push through.     Vision         Perception     Praxis      Pertinent Vitals/Pain       Hand Dominance     Extremity/Trunk Assessment Upper Extremity Assessment Upper Extremity Assessment: RUE deficits/detail RUE Deficits / Details: limited ROM, FF approximately 30; states she has bone on bone           Communication Communication Communication: No difficulties   Cognition Arousal/Alertness: Awake/alert Behavior During Therapy: WFL for tasks assessed/performed Overall Cognitive Status: Within Functional Limits for tasks assessed                                     General Comments       Exercises     Shoulder Instructions      Home Living Family/patient expects to be discharged to:: Private residence Living Arrangements: Alone                 Bathroom Shower/Tub: Occupational psychologist: Standard     Home Equipment: Shower seat;Wheelchair -  manual;Walker - 2 wheels;Walker - 4 wheels(reacher)   Additional Comments: children will check on her daily; uses 4 wheel walker in house; 2 wheel outside.  Moved here in 04/25/2023; husband died in 09/25/2023      Prior Functioning/Environment Level of Independence: Independent with assistive device(s)        Comments: 2 doctors in the family        OT Problem List: Decreased strength;Decreased activity tolerance;Pain;Decreased knowledge of use of DME or AE      OT Treatment/Interventions:      OT Goals(Current goals can be found in the care plan section) Acute Rehab OT Goals Patient Stated Goal: home; return to independence OT Goal Formulation: All assessment and education complete, DC therapy  OT Frequency:     Barriers to D/C:            Co-evaluation              AM-PAC PT "6 Clicks" Daily Activity     Outcome  Measure Help from another person eating meals?: None Help from another person taking care of personal grooming?: A Little Help from another person toileting, which includes using toliet, bedpan, or urinal?: A Little Help from another person bathing (including washing, rinsing, drying)?: A Little Help from another person to put on and taking off regular upper body clothing?: A Little Help from another person to put on and taking off regular lower body clothing?: A Little 6 Click Score: 19   End of Session    Activity Tolerance: Patient tolerated treatment well Patient left: in chair;with call bell/phone within reach  OT Visit Diagnosis: Muscle weakness (generalized) (M62.81)                Time: 1025-8527 OT Time Calculation (min): 34 min Charges:  OT General Charges $OT Visit: 1 Visit OT Evaluation $OT Eval Low Complexity: 1 Low OT Treatments $Self Care/Home Management : 8-22 mins G-Codes:     Lesle Chris, OTR/L [phone] 02/20/2018  Maebell Lyvers 02/20/2018, 9:46 AM

## 2018-02-20 NOTE — Telephone Encounter (Signed)
Left message asking pt to call me at [phone] to scheduled AWV and follow up appt with Dr. Mariea Clonts in April.  VDM (DD)

## 2018-02-21 ENCOUNTER — Telehealth: Payer: Self-pay

## 2018-02-21 NOTE — Telephone Encounter (Signed)
Transition Care Management Follow-Up Telephone Call   Date discharged and where: [facility] on 02/20/2018  How have you been since you were released from the hospital? Per pt daughter- surgical site was bleeding a little yesterday so they repacked it. No signs of bleeding today. Pt has been taking OTC pain meds and pain pill at night. FYI Hep A/B shots done- last one will be done in a few months  Any patient concerns? None  Items Reviewed:   Meds: Y  Allergies: Y  Dietary Changes Reviewed: Y  Functional Questionnaire:  Independent-I Dependent-D  ADLs:   Dressing- I    Eating- I   Maintaining continence- occasionally bowels incontinent    Transferring- I w/ walker, sometimes cane   Transportation- D   Meal Prep- I   Managing Meds- I w/ assist  Confirmed importance and Date/Time of follow-up visits scheduled: Yes, patient is f/u with surgeon in 2-3 weeeks   Confirmed with patient if condition worsens to call PCP or go to the Emergency Dept. Patient was given office number and encouraged to call back with questions or concerns: Yes

## 2018-03-06 ENCOUNTER — Other Ambulatory Visit: Payer: Self-pay | Admitting: Internal Medicine

## 2018-03-06 DIAGNOSIS — N644 Mastodynia: Secondary | ICD-10-CM

## 2018-03-06 DIAGNOSIS — Z853 Personal history of malignant neoplasm of breast: Secondary | ICD-10-CM

## 2018-03-19 ENCOUNTER — Ambulatory Visit (INDEPENDENT_AMBULATORY_CARE_PROVIDER_SITE_OTHER): Payer: Medicare Other

## 2018-03-19 VITALS — BP 130/70 | HR 74 | Temp 98.0°F | Ht <= 58 in | Wt 183.0 lb

## 2018-03-19 DIAGNOSIS — Z0000 Encounter for general adult medical examination without abnormal findings: Secondary | ICD-10-CM | POA: Diagnosis not present

## 2018-03-19 NOTE — Progress Notes (Signed)
Subjective:   Christina [name] is a 72 y.o. female who presents for Medicare Annual (Subsequent) preventive examination.  Last AWV-08/25/2015    Objective:     Vitals: BP 130/70 (BP Location: Right Arm, Patient Position: Sitting)   Pulse 74   Temp 98 F (36.7 C) (Oral)   Ht 4' 9"  (1.448 m)   Wt 183 lb (83 kg)   SpO2 97%   BMI 39.60 kg/m   Body mass index is 39.6 kg/m.  Advanced Directives 03/19/2018 02/19/2018 02/12/2018 01/08/2018 12/30/2017 09/09/2017 05/17/2017  Does Patient Have a Medical Advance Directive? Yes Yes Yes Yes Yes Yes Yes  Type of Advance Directive Out of facility DNR (pink MOST or yellow form);[place];Living will [place];Living will [place];Living will Out of facility DNR (pink MOST or yellow form) Out of facility DNR (pink MOST or yellow form) Out of facility DNR (pink MOST or yellow form) Healthcare Power of [place];Living will  Does patient want to make changes to medical advance directive? No - Patient declined No - Patient declined No - Patient declined No - Patient declined No - Patient declined - -  Copy of [place] in Chart? No - copy requested No - copy requested No - copy requested - - - Yes  Pre-existing out of facility DNR order (yellow form or pink MOST form) Yellow form placed in chart (order not valid for inpatient use) - - Yellow form placed in chart (order not valid for inpatient use);Pink MOST form placed in chart (order not valid for inpatient use) Yellow form placed in chart (order not valid for inpatient use);Pink MOST form placed in chart (order not valid for inpatient use) Yellow form placed in chart (order not valid for inpatient use);Pink MOST form placed in chart (order not valid for inpatient use) -    Tobacco Social History   Tobacco Use  Smoking Status Never Smoker  Smokeless Tobacco Never Used     Counseling given: Not Answered   Clinical  Intake:  Pre-visit preparation completed: No  Pain : No/denies pain     Nutritional Risks: None Diabetes: No  How often do you need to have someone help you when you read instructions, pamphlets, or other written materials from your doctor or pharmacy?: 1 - Never What is the last grade level you completed in school?: [employer]  Interpreter Needed?: No  Information entered by :: [name], RN  Past Medical History:  Diagnosis Date  . Abnormal EKG   . Abnormal finding on thyroid function test   . Abnormal liver function test   . Acute edema   . Alcoholic cirrhosis ([place]) 82/50/0370   Possible NASH overlap (MELD 13)  . Anemia   . Anxiety   . Arthritis   . Ascites   . Back pain   . Breast mass   . Chronic low back pain   . Complete right rotator cuff tear   . Complication of anesthesia    during first surgery- woke up during surgery many years ago  . Compression fracture of L4 lumbar vertebra (HCC)   . Dermatitis, eczematoid   . Difficulty breathing   . Gallstones   . History of adenocarcinoma of breast   . Hypercholesterolemia   . Hyperkalemia   . Hypertension   . Hypokalemia   . Hypothyroidism   . Insomnia   . Knee pain   . Leukocytosis   . Lymphadenopathy   .  Macrocytosis   . Memory loss or impairment   . Menopause   . MGUS (monoclonal gammopathy of unknown significance)   . Osteoarthritis   . Osteoarthritis of right knee   . Other cirrhosis of liver ([place])   . Renal insufficiency syndrome   . Right knee DJD   . Solitary thyroid nodule   . Unspecified lump in the left breast, unspecified quadrant   . Vitamin B 12 deficiency   . 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[place])    Past Surgical History:  Procedure Laterality Date  . BREAST SURGERY  2014   Removed lymphnodes  . EYE SURGERY  2006   bilateral cataract  . JOINT REPLACEMENT     right  (2017)and left knee  . LAPAROSCOPIC CHOLECYSTECTOMY SINGLE SITE WITH INTRAOPERATIVE CHOLANGIOGRAM N/A 02/19/2018    Procedure: LAPAROSCOPIC CHOLECYSTECTOMY;  Surgeon: [name], MD;  Location: [facility];  Service: General;  Laterality: N/A;  . LESION REMOVAL  09/2015   tubular adenoma-4 subcentimeter lesions  . LIVER BIOPSY  02/19/2018   Procedure: LIVER BIOPSY;  Surgeon: [name], MD;  Location: [facility];  Service: General;;  . right thumb surgery    . TONSILLECTOMY    . TOTAL KNEE ARTHROPLASTY Right 09/05/2016  . UMBILICAL HERNIA REPAIR  02/19/2018   Procedure: REPAIR OF INCARCERATED UMBILICAL HERNIA;  Surgeon: [name], MD;  Location: [facility];  Service: General;;   Family History  Problem Relation Age of Onset  . Breast cancer Mother   . Colon cancer Neg Hx   . Stomach cancer Neg Hx   . Rectal cancer Neg Hx   . Liver cancer Neg Hx   . Esophageal cancer Neg Hx    Social History   Socioeconomic History  . Marital status: Widowed    Spouse name: Not on file  . Number of children: 6  . Years of education: Not on file  . Highest education level: Not on file  Occupational History  . Occupation: retired  Scientific laboratory technician  . Financial resource strain: Not hard at all  . Food insecurity:    Worry: Never true    Inability: Never true  . Transportation needs:    Medical: No    Non-medical: No  Tobacco Use  . Smoking status: Never Smoker  . Smokeless tobacco: Never Used  Substance and Sexual Activity  . Alcohol use: No    Comment: 6 or more per day in the past.   . Drug use: No  . Sexual activity: Not Currently  Lifestyle  . Physical activity:    Days per week: 7 days    Minutes per session: 20 min  . Stress: To some extent  Relationships  . Social connections:    Talks on phone: More than three times a week    Gets together: More than three times a week    Attends religious service: Never    Active member of club or organization: No    Attends meetings of clubs or organizations: Never    Relationship status: Widowed  Other Topics Concern  . Not on file  Social History  Narrative   Social History       Diet? Regular      Do you drink/eat things with caffeine? 1 cup      Marital status?                        married  What year were you married?      Do you live in a house, apartment, assisted living, condo, trailer, etc.? townhouse      Is it one or more stories? no      How many persons live in your home? two      Do you have any pets in your home? (please list) no      Highest level of education completed? High school      Current or past profession: realtor      Do you exercise?      no                                Type & how often?      Advanced Directives      Do you have a living will? yes      Do you have a DNR form?      yes                            If not, do you want to discuss one?      Do you have signed POA/HPOA for forms? yes      Functional Status      Do you have difficulty bathing or dressing yourself? no      Do you have difficulty preparing food or eating? no      Do you have difficulty managing your medications? no      Do you have difficulty managing your finances? no      Do you have difficulty affording your medications? no    Outpatient Encounter Medications as of 03/19/2018  Medication Sig  . buPROPion (WELLBUTRIN XL) 150 MG 24 hr tablet TAKE ONE TABLET BY MOUTH DAILY  . Cholecalciferol (VITAMIN D3) 2000 units TABS Take 1 tablet by mouth every morning.  . citalopram (CELEXA) 40 MG tablet Take 1 tablet (40 mg total) by mouth daily.  . furosemide (LASIX) 20 MG tablet Take 1 tablet (20 mg total) by mouth every morning.  Marland Kitchen ibuprofen (ADVIL,MOTRIN) 200 MG tablet Take 400 mg by mouth daily as needed (for pain.).   Marland Kitchen ketoconazole (NIZORAL) 2 % shampoo Apply 1 application topically 2 (two) times a week.  . lactulose (CHRONULAC) 10 GM/15ML solution Take 10 g by mouth daily.  Marland Kitchen levothyroxine (SYNTHROID, LEVOTHROID) 88 MCG tablet TAKE 1 TABLET BY MOUTH ONCE A  DAY 30 MINUTES BEFORE BREAKFAST AND BEFORE  ANY OTHER MEDICATIONS  . Melatonin 10 MG TABS Take one tablet by mouth once daily at bedtime for rest (Patient taking differently: Take 10 mg by mouth at bedtime as needed (for sleep.). )  . Multiple Vitamin (MULTIVITAMIN) tablet Take 1 tablet by mouth daily.  Marland Kitchen oxyCODONE (ROXICODONE) 5 MG immediate release tablet Take 1-2 tablets (5-10 mg total) by mouth every 6 (six) hours as needed for severe pain.  . potassium chloride (K-DUR) 10 MEQ tablet TAKE ONE TABLET BY MOUTH DAILY  . propranolol (INDERAL) 20 MG tablet Take 1 tablet (20 mg total) by mouth every morning.  Marland Kitchen spironolactone (ALDACTONE) 100 MG tablet Take 100 mg by mouth every morning.  . traMADol (ULTRAM) 50 MG tablet Take 25 mg by mouth daily as needed (for pain.).   Marland Kitchen vitamin B-12 (CYANOCOBALAMIN) 1000 MCG tablet Take 1,000 mcg by mouth every morning.   No facility-administered encounter medications on file as  of 03/19/2018.     Activities of Daily Living In your present state of health, do you have any difficulty performing the following activities: 03/19/2018 02/19/2018  Hearing? N N  Vision? N N  Difficulty concentrating or making decisions? N N  Walking or climbing stairs? Y Y  Dressing or bathing? N N  Doing errands, shopping? N N  Preparing Food and eating ? N -  Using the Toilet? N -  In the past six months, have you accidently leaked urine? N -  Do you have problems with loss of bowel control? N -  Managing your Medications? Y -  Managing your Finances? N -  Housekeeping or managing your Housekeeping? N -  Some recent data might be hidden    Patient Care Team: Gayland Curry, DO as PCP - General (Geriatric Medicine) Armbruster, [name], MD as Consulting Physician (Gastroenterology) [name], MD as Consulting Physician (General Surgery)    Assessment:   This is a routine wellness examination for Christina [name].  Exercise Activities and Dietary recommendations Current Exercise Habits: Home exercise routine, Type  of exercise: walking, Time (Minutes): 10, Frequency (Times/Week): 7, Weekly Exercise (Minutes/Week): 70, Exercise limited by: None identified  Goals    None      Fall Risk Fall Risk  03/19/2018 01/22/2018 01/07/2018 12/30/2017 09/09/2017  Falls in the past year? No No No No No   Is the patient's home free of loose throw rugs in walkways, pet beds, electrical cords, etc?   yes      Grab bars in the bathroom? yes      Handrails on the stairs?   yes      Adequate lighting?   yes  Depression Screen PHQ 2/9 Scores 03/19/2018 12/30/2017 09/09/2017 05/17/2017  PHQ - 2 Score 0 0 0 0     Cognitive Function MMSE - Mini Mental State Exam 03/19/2018  Orientation to time 5  Orientation to Place 5  Registration 3  Attention/ Calculation 5  Recall 3  Language- name 2 objects 2  Language- repeat 1  Language- follow 3 step command 3  Language- read & follow direction 1  Write a sentence 1  Copy design 1  Total score 30        Immunization History  Administered Date(s) Administered  . Hep A / Hep B 01/13/2018, 02/12/2018  . Influenza Inj Mdck Quad Pf 10/12/2013  . Influenza, High Dose Seasonal PF 09/09/2017  . Influenza-Unspecified 09/10/2007, 09/22/2008, 10/15/2012, 09/29/2014, 08/24/2016  . Pneumococcal Conjugate-13 12/30/2017  . Pneumococcal Polysaccharide-23 06/25/2013  . Tdap 09/02/2015  . Zoster 12/28/2013  . Zoster Recombinat (Shingrix) 06/25/2017, 10/16/2017    Qualifies for Shingles Vaccine? Up to date  Screening Tests Health Maintenance  Topic Date Due  . MAMMOGRAM  11/05/2019  . TETANUS/TDAP  09/01/2025  . COLONOSCOPY  10/19/2025  . INFLUENZA VACCINE  Completed  . DEXA SCAN  Completed  . Hepatitis C Screening  Completed  . PNA vac Low Risk Adult  Completed    Cancer Screenings: Lung: Low Dose CT Chest recommended if Age 34-80 years, 30 pack-year currently smoking OR have quit w/in 15years. Patient does not qualify. Breast:  Up to date on Mammogram? Yes   Up to date  of Bone Density/Dexa? Yes Colorectal: up to date  Additional Screenings:  Hepatitis C Screening: declined     Plan:    I have personally reviewed and addressed the Medicare Annual Wellness questionnaire and have noted the following in the patient's chart:  A. Medical and social history B. Use of alcohol, tobacco or illicit drugs  C. Current medications and supplements D. Functional ability and status E.  Nutritional status F.  Physical activity G. Advance directives H. List of other physicians I.  Hospitalizations, surgeries, and ER visits in previous 12 months J.  [place] to include hearing, vision, cognitive, depression L. Referrals and appointments - none  In addition, I have reviewed and discussed with patient certain preventive protocols, quality metrics, and best practice recommendations. A written personalized care plan for preventive services as well as general preventive health recommendations were provided to patient.  See attached scanned questionnaire for additional information.   Signed,   [name], RN Nurse Health Advisor  Patient Concerns: Ringing in L ear

## 2018-03-19 NOTE — Patient Instructions (Signed)
Ms. Christina [name] , Thank you for taking time to come for your Medicare Wellness Visit. I appreciate your ongoing commitment to your health goals. Please review the following plan we discussed and let me know if I can assist you in the future.   Screening recommendations/referrals: Colonoscopy up to date Mammogram up to date, due 11/22/2018 Bone Density up to date, due 05/30/2019 Recommended yearly ophthalmology/optometry visit for glaucoma screening and checkup Recommended yearly dental visit for hygiene and checkup  Vaccinations: Influenza vaccine up to date, due 72 fall season Pneumococcal vaccine up to date, completed Tdap vaccine up to date, due 09/01/2025 Shingles vaccine up to date, complteed    Advanced directives: Please bring [place] a copy of your living will and health care power of attorney  Conditions/risks identified: none  Next appointment: Dr. Mariea [name] [birth year]73 @ 1pm             Christina Dense, RN 03/24/73 @ 10:45am   Preventive Care 74 Years and Older, Female Preventive care refers to lifestyle choices and visits with your health care provider that can promote health and wellness. What does preventive care include?  A yearly physical exam. This is also called an annual well check.  Dental exams once or twice a year.  Routine eye exams. Ask your health care provider how often you should have your eyes checked.  Personal lifestyle choices, including:  Daily care of your teeth and gums.  Regular physical activity.  Eating a healthy diet.  Avoiding tobacco and drug use.  Limiting alcohol use.  Practicing safe sex.  Taking low-dose aspirin every day.  Taking vitamin and mineral supplements as recommended by your health care provider. What happens during an annual well check? The services and screenings done by your health care provider during your annual well check will depend on your age, overall health, lifestyle risk factors, and family history of disease. Counseling    Your health care provider may ask you questions about your:  Alcohol use.  Tobacco use.  Drug use.  Emotional well-being.  Home and relationship well-being.  Sexual activity.  Eating habits.  History of falls.  Memory and ability to understand (cognition).  Work and work Statistician.  Reproductive health. Screening  You may have the following tests or measurements:  Height, weight, and BMI.  Blood pressure.  Lipid and cholesterol levels. These may be checked every 5 years, or more frequently if you are over 37 years old.  Skin check.  Lung cancer screening. You may have this screening every year starting at age 87 if you have a 30-pack-year history of smoking and currently smoke or have quit within the past 15 years.  Fecal occult blood test (FOBT) of the stool. You may have this test every year starting at age 66.  Flexible sigmoidoscopy or colonoscopy. You may have a sigmoidoscopy every 5 years or a colonoscopy every 10 years starting at age 58.  Hepatitis C blood test.  Hepatitis B blood test.  Sexually transmitted disease (STD) testing.  Diabetes screening. This is done by checking your blood sugar (glucose) after you have not eaten for a while (fasting). You may have this done every 1-3 years.  Bone density scan. This is done to screen for osteoporosis. You may have this done starting at age 38.  Mammogram. This may be done every 1-2 years. Talk to your health care provider about how often you should have regular mammograms. Talk with your health care provider about your test results, treatment options,  and if necessary, the need for more tests. Vaccines  Your health care provider may recommend certain vaccines, such as:  Influenza vaccine. This is recommended every year.  Tetanus, diphtheria, and acellular pertussis (Tdap, Td) vaccine. You may need a Td booster every 10 years.  Zoster vaccine. You may need this after age 47.  Pneumococcal 13-valent  conjugate (PCV13) vaccine. One dose is recommended after age 67.  Pneumococcal polysaccharide (PPSV23) vaccine. One dose is recommended after age 96. Talk to your health care provider about which screenings and vaccines you need and how often you need them. This information is not intended to replace advice given to you by your health care provider. Make sure you discuss any questions you have with your health care provider. Document Released: 01/06/2016 Document Revised: 08/29/2016 Document Reviewed: 10/11/2015 Elsevier Interactive Patient Education  2017 [place] Prevention in the Home Falls can cause injuries. They can happen to people of all ages. There are many things you can do to make your home safe and to help prevent falls. What can I do on the outside of my home?  Regularly fix the edges of walkways and driveways and fix any cracks.  Remove anything that might make you trip as you walk through a door, such as a raised step or threshold.  Trim any bushes or trees on the path to your home.  Use bright outdoor lighting.  Clear any walking paths of anything that might make someone trip, such as rocks or tools.  Regularly check to see if handrails are loose or broken. Make sure that both sides of any steps have handrails.  Any raised decks and porches should have guardrails on the edges.  Have any leaves, snow, or ice cleared regularly.  Use sand or salt on walking paths during winter.  Clean up any spills in your garage right away. This includes oil or grease spills. What can I do in the bathroom?  Use night lights.  Install grab bars by the toilet and in the tub and shower. Do not use towel bars as grab bars.  Use non-skid mats or decals in the tub or shower.  If you need to sit down in the shower, use a plastic, non-slip stool.  Keep the floor dry. Clean up any water that spills on the floor as soon as it happens.  Remove soap buildup in the tub or  shower regularly.  Attach bath mats securely with double-sided non-slip rug tape.  Do not have throw rugs and other things on the floor that can make you trip. What can I do in the bedroom?  Use night lights.  Make sure that you have a light by your bed that is easy to reach.  Do not use any sheets or blankets that are too big for your bed. They should not hang down onto the floor.  Have a firm chair that has side arms. You can use this for support while you get dressed.  Do not have throw rugs and other things on the floor that can make you trip. What can I do in the kitchen?  Clean up any spills right away.  Avoid walking on wet floors.  Keep items that you use a lot in easy-to-reach places.  If you need to reach something above you, use a strong step stool that has a grab bar.  Keep electrical cords out of the way.  Do not use floor polish or wax that makes floors slippery. If  you must use wax, use non-skid floor wax.  Do not have throw rugs and other things on the floor that can make you trip. What can I do with my stairs?  Do not leave any items on the stairs.  Make sure that there are handrails on both sides of the stairs and use them. Fix handrails that are broken or loose. Make sure that handrails are as long as the stairways.  Check any carpeting to make sure that it is firmly attached to the stairs. Fix any carpet that is loose or worn.  Avoid having throw rugs at the top or bottom of the stairs. If you do have throw rugs, attach them to the floor with carpet tape.  Make sure that you have a light switch at the top of the stairs and the bottom of the stairs. If you do not have them, ask someone to add them for you. What else can I do to help prevent falls?  Wear shoes that:  Do not have high heels.  Have rubber bottoms.  Are comfortable and fit you well.  Are closed at the toe. Do not wear sandals.  If you use a stepladder:  Make sure that it is fully  opened. Do not climb a closed stepladder.  Make sure that both sides of the stepladder are locked into place.  Ask someone to hold it for you, if possible.  Clearly mark and make sure that you can see:  Any grab bars or handrails.  First and last steps.  Where the edge of each step is.  Use tools that help you move around (mobility aids) if they are needed. These include:  Canes.  Walkers.  Scooters.  Crutches.  Turn on the lights when you go into a dark area. Replace any light bulbs as soon as they burn out.  Set up your furniture so you have a clear path. Avoid moving your furniture around.  If any of your floors are uneven, fix them.  If there are any pets around you, be aware of where they are.  Review your medicines with your doctor. Some medicines can make you feel dizzy. This can increase your chance of falling. Ask your doctor what other things that you can do to help prevent falls. This information is not intended to replace advice given to you by your health care provider. Make sure you discuss any questions you have with your health care provider. Document Released: 10/06/2009 Document Revised: 05/17/2016 Document Reviewed: 01/14/2015 Elsevier Interactive Patient Education  2017 [employer].

## 2018-03-28 ENCOUNTER — Other Ambulatory Visit: Payer: Self-pay | Admitting: *Deleted

## 2018-03-28 MED ORDER — BUPROPION HCL ER (XL) 150 MG PO TB24: 150.0000 mg | ORAL_TABLET | Freq: Every day | ORAL | 1 refills | Status: DC

## 2018-03-28 NOTE — Telephone Encounter (Signed)
[place]

## 2018-04-23 ENCOUNTER — Other Ambulatory Visit: Payer: Self-pay | Admitting: Internal Medicine

## 2018-05-07 ENCOUNTER — Ambulatory Visit (INDEPENDENT_AMBULATORY_CARE_PROVIDER_SITE_OTHER): Payer: Medicare Other

## 2018-05-07 ENCOUNTER — Encounter: Payer: Self-pay | Admitting: Podiatry

## 2018-05-07 ENCOUNTER — Ambulatory Visit (INDEPENDENT_AMBULATORY_CARE_PROVIDER_SITE_OTHER): Payer: Medicare Other | Admitting: Podiatry

## 2018-05-07 VITALS — BP 97/65 | HR 63 | Resp 16

## 2018-05-07 DIAGNOSIS — M722 Plantar fascial fibromatosis: Secondary | ICD-10-CM

## 2018-05-07 DIAGNOSIS — B079 Viral wart, unspecified: Secondary | ICD-10-CM | POA: Diagnosis not present

## 2018-05-07 NOTE — Progress Notes (Signed)
   Subjective:    Patient ID: Christina [name], female    DOB: [birth year], 72 y.o.   MRN: [id number]  HPI    Review of Systems  All other systems reviewed and are negative.      Objective:   Physical Exam        Assessment & Plan:

## 2018-05-11 NOTE — Progress Notes (Signed)
Subjective:   Patient ID: Christina [name], female   DOB: 72 y.o.   MRN: [id number]   HPI Patient presents with painful lesion plantar aspect left hallux first metatarsal that has been there for a while and she thinks is a wart.  Also has history of fasciitis symptoms   Review of Systems  All other systems reviewed and are negative.       Objective:  Physical Exam  Constitutional: She appears well-developed and well-nourished.  Cardiovascular: Intact distal pulses.  Pulmonary/Chest: Effort normal.  Musculoskeletal: Normal range of motion.  Neurological: She is alert.  Skin: Skin is warm.  Nursing note and vitals reviewed.   Neurovascular status intact muscle strength adequate range of motion within normal limits with patient found to have lesion sub-hallux left that upon debridement shows pinpoint bleeding pain direct pressure.  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  Sterile dressing applied discussed supportive therapy for plantar fasciitis  X-rays indicate small spur but no indication stress fracture arthritis

## 2018-05-26 ENCOUNTER — Telehealth: Payer: Self-pay | Admitting: Gastroenterology

## 2018-05-26 NOTE — Telephone Encounter (Signed)
Patient is due for a follow up visit with Dr. Havery Moros and I did schedule her one for 7/5, can you relay that appointment information to her and she can also get her hep injection that day too. Always better to schedule after date due than before. Thanks [employer].

## 2018-05-26 NOTE — Telephone Encounter (Signed)
Patient daughter aware that inj appointment has been cancelled and our office will do hep #3 inj on 06/27/18 appointment with Dr. Havery Moros.

## 2018-05-30 ENCOUNTER — Encounter: Payer: Self-pay | Admitting: *Deleted

## 2018-06-02 ENCOUNTER — Ambulatory Visit: Payer: Self-pay | Admitting: Internal Medicine

## 2018-06-18 ENCOUNTER — Telehealth: Payer: Self-pay

## 2018-06-18 NOTE — Telephone Encounter (Signed)
Incoming fax request for Lactulose 10 GM./ 15 ML pt has a follow up scheduled for 06-27-2018. Ok to refill?

## 2018-06-19 MED ORDER — LACTULOSE 10 GM/15ML PO SOLN: 10.0000 g | Freq: Three times a day (TID) | ORAL | 0 refills | Status: DC

## 2018-06-19 NOTE — Telephone Encounter (Signed)
Refilled as directed.

## 2018-06-19 NOTE — Telephone Encounter (Signed)
Yes okay to refill. Thanks Vivien Rota

## 2018-06-23 ENCOUNTER — Encounter: Payer: Self-pay | Admitting: Internal Medicine

## 2018-06-23 ENCOUNTER — Ambulatory Visit (INDEPENDENT_AMBULATORY_CARE_PROVIDER_SITE_OTHER): Payer: Medicare Other | Admitting: Internal Medicine

## 2018-06-23 VITALS — BP 120/70 | HR 61 | Temp 98.0°F | Ht <= 58 in | Wt 175.0 lb

## 2018-06-23 DIAGNOSIS — L219 Seborrheic dermatitis, unspecified: Secondary | ICD-10-CM

## 2018-06-23 DIAGNOSIS — H9312 Tinnitus, left ear: Secondary | ICD-10-CM

## 2018-06-23 DIAGNOSIS — C880 Waldenstrom macroglobulinemia: Secondary | ICD-10-CM | POA: Diagnosis not present

## 2018-06-23 DIAGNOSIS — Z6841 Body Mass Index (BMI) 40.0 and over, adult: Secondary | ICD-10-CM | POA: Insufficient documentation

## 2018-06-23 DIAGNOSIS — Z6837 Body mass index (BMI) 37.0-37.9, adult: Secondary | ICD-10-CM | POA: Diagnosis not present

## 2018-06-23 DIAGNOSIS — K7031 Alcoholic cirrhosis of liver with ascites: Secondary | ICD-10-CM

## 2018-06-23 DIAGNOSIS — F339 Major depressive disorder, recurrent, unspecified: Secondary | ICD-10-CM | POA: Diagnosis not present

## 2018-06-23 DIAGNOSIS — E785 Hyperlipidemia, unspecified: Secondary | ICD-10-CM | POA: Diagnosis not present

## 2018-06-23 DIAGNOSIS — E6609 Other obesity due to excess calories: Secondary | ICD-10-CM

## 2018-06-23 MED ORDER — BUPROPION HCL ER (XL) 300 MG PO TB24: 300.0000 mg | ORAL_TABLET | Freq: Every day | ORAL | 1 refills | Status: DC

## 2018-06-23 MED ORDER — KETOCONAZOLE 2 % EX SHAM: 1.0000 "application " | MEDICATED_SHAMPOO | CUTANEOUS | 3 refills | Status: DC

## 2018-06-23 MED ORDER — LACTULOSE 10 GM/15ML PO SOLN: 10.0000 g | Freq: Every day | ORAL | 0 refills | Status: DC

## 2018-06-23 NOTE — Patient Instructions (Addendum)
Try over the counter biofreeze or aspercreme on the knees.    Try to get up earlier in the morning and take your increased dose of wellbutrin (334m)--you may take 2 of the 1536mtablets until they are used up.  You may rest better if you go to bed later.  Stop melatonin since 3075mf it is not working.  Seems it is just not for you!  Increase your walking when you first get up or in the evening just before it gets dark and it's a little cooler out.  This will help you rest.  Goal steps per day is 5000.   Fat and Cholesterol Restricted Diet Getting too much fat and cholesterol in your diet may cause health problems. Following this diet helps keep your fat and cholesterol at normal levels. This can keep you from getting sick. What types of fat should I choose?  Choose monosaturated and polyunsaturated fats. These are found in foods such as olive oil, canola oil, flaxseeds, walnuts, almonds, and seeds.  Eat more omega-3 fats. Good choices include salmon, mackerel, sardines, tuna, flaxseed oil, and ground flaxseeds.  Limit saturated fats. These are in animal products such as meats, butter, and cream. They can also be in plant products such as palm oil, palm kernel oil, and coconut oil.  Avoid foods with partially hydrogenated oils in them. These contain trans fats. Examples of foods that have trans fats are stick margarine, some tub margarines, cookies, crackers, and other baked goods. What general guidelines do I need to follow?  Check food labels. Look for the words "trans fat" and "saturated fat."  When preparing a meal: ? Fill half of your plate with vegetables and green salads. ? Fill one fourth of your plate with whole grains. Look for the word "whole" as the first word in the ingredient list. ? Fill one fourth of your plate with lean protein foods.  Eat more foods that have fiber, like apples, carrots, beans, peas, and barley.  Eat more home-cooked foods. Eat less at restaurants  and buffets.  Limit or avoid alcohol.  Limit foods high in starch and sugar.  Limit fried foods.  Cook foods without frying them. Baking, boiling, grilling, and broiling are all great options.  Lose weight if you are overweight. Losing even a small amount of weight can help your overall health. It can also help prevent diseases such as diabetes and heart disease. What foods can I eat? Grains Whole grains, such as whole wheat or whole grain breads, crackers, cereals, and pasta. Unsweetened oatmeal, bulgur, barley, quinoa, or brown rice. Corn or whole wheat flour tortillas. Vegetables Fresh or frozen vegetables (raw, steamed, roasted, or grilled). Green salads. Fruits All fresh, canned (in natural juice), or frozen fruits. Meat and Other Protein Products Ground beef (85% or leaner), grass-fed beef, or beef trimmed of fat. Skinless chicken or [place] chicken or [place] trimmed of fat. All fish and seafood. Eggs. Dried beans, peas, or lentils. Unsalted nuts or seeds. Unsalted canned or dry beans. Dairy Low-fat dairy products, such as skim or 1% milk, 2% or reduced-fat cheeses, low-fat ricotta or cottage cheese, or plain low-fat yogurt. Fats and Oils Tub margarines without trans fats. Light or reduced-fat mayonnaise and salad dressings. Avocado. Olive, canola, sesame, or safflower oils. Natural peanut or almond butter (choose ones without added sugar and oil). The items listed above may not be a complete list of recommended foods or beverages. Contact your dietitian for more options. What foods  are not recommended? Grains White bread. White pasta. White rice. Cornbread. Bagels, pastries, and croissants. Crackers that contain trans fat. Vegetables White potatoes. Corn. Creamed or fried vegetables. Vegetables in a cheese sauce. Fruits Dried fruits. Canned fruit in light or heavy syrup. Fruit juice. Meat and Other Protein Products Fatty cuts of meat. Ribs, chicken wings, bacon,  sausage, bologna, salami, chitterlings, fatback, hot dogs, bratwurst, and packaged luncheon meats. Liver and organ meats. Dairy Whole or 2% milk, cream, half-and-half, and cream cheese. Whole milk cheeses. Whole-fat or sweetened yogurt. Full-fat cheeses. Nondairy creamers and whipped toppings. Processed cheese, cheese spreads, or cheese curds. Sweets and Desserts Corn syrup, sugars, honey, and molasses. Candy. Jam and jelly. Syrup. Sweetened cereals. Cookies, pies, cakes, donuts, muffins, and ice cream. Fats and Oils Butter, stick margarine, lard, shortening, ghee, or bacon fat. Coconut, palm kernel, or palm oils. Beverages Alcohol. Sweetened drinks (such as sodas, lemonade, and fruit drinks or punches). The items listed above may not be a complete list of foods and beverages to avoid. Contact your dietitian for more information. This information is not intended to replace advice given to you by your health care provider. Make sure you discuss any questions you have with your health care provider. Document Released: 06/10/2012 Document Revised: 08/16/2016 Document Reviewed: 03/11/2014 Elsevier Interactive Patient Education  [employer].

## 2018-06-23 NOTE — Progress Notes (Signed)
Location:  [facility] clinic Provider:  Jeanni Allshouse L. Mariea Clonts, D.O., C.M.D.  Code Status: DNR Goals of Care:  Advanced Directives 06/23/2018  Does Patient Have a Medical Advance Directive? Yes  Type of Advance Directive Out of facility DNR (pink MOST or yellow form)  Does patient want to make changes to medical advance directive? No - Patient declined  Copy of [place] in Chart? -  Pre-existing out of facility DNR order (yellow form or pink MOST form) Yellow form placed in chart (order not valid for inpatient use);Pink MOST form placed in chart (order not valid for inpatient use)     Chief Complaint  Patient presents with  . Medical Management of Chronic Issues    [facility] follow-up    HPI: Patient is a 72y.o. female seen today for medical management of chronic diseases.    She is down 8 lbs since March 27 of this year.   She is eating healthier--less junk.  Meal preps are more strategic.   She's doing better, but her bereavement counselor recommended an increase in her antidepressant.    Her hair is not improving with the medicated shampoo and she is now out.  She's still using the tar shampoo and selsun blue.  There's less scaliness when using the shampoo, but it itches like mad.  She's been taking her lactulose like she should.  She takes it only once a day, but he has it ordered as tid.  She says she would never leave the bathroom.    She is not sleeping well.  She did increase her melatonin to 3 145mmelatonin capsules.  It does not make any difference.  Her head won't stop thinking.  She is not following a routine.  She can actually sleep better if she naps in the daytime.  Takes the melatonin around 9pm.  She never had a normal bedtime--she went to bed according to her husband's schedule.  She now goes to bed at 10pm.  She does not want to get out of bed in the morning.  She does not roll out of bed until 10am.  Thinks she sleep 4-5 hrs on average.  Some nights doesn't  sleep at all.    She also hurts in the mornings.  Takes 2 daily motrin if needed.  Still hurts.   Eats, does chores.  Crochets, sews.  Getting 01-2999 steps/day on fitbit.  Whole body feels like giant toothache, moves around.  Says she has more pain if she is more active.  Knees ache.    Left ear rings constantly.    Office Visit from 06/23/2018 in [facility] Score  0     Past Medical History:  Diagnosis Date  . Abnormal EKG   . Abnormal finding on thyroid function test   . Abnormal liver function test   . Acute edema   . Alcoholic cirrhosis ([place]2876 Possible NASH overlap (MELD 13)  . Anemia   . Anxiety   . Arthritis   . Ascites   . Back pain   . Breast mass   . Chronic low back pain   . Complete right rotator cuff tear   . Complication of anesthesia    during first surgery- woke up during surgery many years ago  . Compression fracture of L4 lumbar vertebra   . Dermatitis, eczematoid   . Difficulty breathing   . Gallstones   . History of adenocarcinoma of breast   .  Hypercholesterolemia   . Hyperkalemia   . Hypertension   . Hypokalemia   . Hypothyroidism   . Insomnia   . Knee pain   . Leukocytosis   . Lymphadenopathy   . Macrocytosis   . Memory loss or impairment   . Menopause   . MGUS (monoclonal gammopathy of unknown significance)   . Osteoarthritis   . Osteoarthritis of right knee   . Other cirrhosis of liver ([place])   . Renal insufficiency syndrome   . Right knee DJD   . Solitary thyroid nodule   . Unspecified lump in the left breast, unspecified quadrant   . Vitamin B 12 deficiency   . 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[place])     Past Surgical History:  Procedure Laterality Date  . BREAST SURGERY  2014   Removed lymphnodes  . EYE SURGERY  2006   bilateral cataract  . JOINT REPLACEMENT     right  (2017)and left knee  . LAPAROSCOPIC CHOLECYSTECTOMY SINGLE SITE WITH INTRAOPERATIVE CHOLANGIOGRAM N/A 02/19/2018   Procedure:  LAPAROSCOPIC CHOLECYSTECTOMY;  Surgeon: [name], MD;  Location: [facility];  Service: General;  Laterality: N/A;  . LESION REMOVAL  09/2015   tubular adenoma-4 subcentimeter lesions  . LIVER BIOPSY  02/19/2018   Procedure: LIVER BIOPSY;  Surgeon: [name], MD;  Location: [facility];  Service: General;;  . right thumb surgery    . TONSILLECTOMY    . TOTAL KNEE ARTHROPLASTY Right 09/05/2016  . UMBILICAL HERNIA REPAIR  02/19/2018   Procedure: REPAIR OF INCARCERATED UMBILICAL HERNIA;  Surgeon: [name], MD;  Location: [facility];  Service: General;;    No Known Allergies  Outpatient Encounter Medications as of 06/23/2018  Medication Sig  . buPROPion (WELLBUTRIN XL) 150 MG 24 hr tablet Take 1 tablet (150 mg total) by mouth daily.  . Cholecalciferol (VITAMIN D3) 2000 units TABS Take 1 tablet by mouth every morning.  . citalopram (CELEXA) 40 MG tablet Take 1 tablet (40 mg total) by mouth daily.  . furosemide (LASIX) 20 MG tablet Take 1 tablet (20 mg total) by mouth every morning.  Marland Kitchen ibuprofen (ADVIL,MOTRIN) 200 MG tablet Take 400 mg by mouth daily as needed (for pain.).   Marland Kitchen ketoconazole (NIZORAL) 2 % shampoo Apply 1 application topically 2 (two) times a week.  . lactulose (CHRONULAC) 10 GM/15ML solution Take 15 mLs (10 g total) by mouth 3 (three) times daily.  Marland Kitchen levothyroxine (SYNTHROID, LEVOTHROID) 88 MCG tablet TAKE 1 TABLET BY MOUTH ONCE A  DAY 30 MINUTES BEFORE BREAKFAST AND BEFORE ANY OTHER MEDICATIONS  . Melatonin 10 MG CAPS Take 3 capsules by mouth at bedtime as needed.  . Multiple Vitamin (MULTIVITAMIN) tablet Take 1 tablet by mouth daily.  . potassium chloride (K-DUR) 10 MEQ tablet TAKE ONE TABLET BY MOUTH DAILY  . propranolol (INDERAL) 20 MG tablet Take 1 tablet (20 mg total) by mouth every morning.  Marland Kitchen spironolactone (ALDACTONE) 100 MG tablet Take 100 mg by mouth every morning.  . vitamin B-12 (CYANOCOBALAMIN) 1000 MCG tablet Take 1,000 mcg by mouth every morning.  . [DISCONTINUED]  lactulose (CHRONULAC) 10 GM/15ML solution Take 10 g by mouth daily.  . [DISCONTINUED] Melatonin 10 MG TABS Take one tablet by mouth once daily at bedtime for rest (Patient taking differently: Take 10 mg by mouth at bedtime as needed (for sleep.). )  . [DISCONTINUED] oxyCODONE (ROXICODONE) 5 MG immediate release tablet Take 1-2 tablets (5-10 mg total) by mouth every 6 (six) hours as needed for severe pain.  . [  DISCONTINUED] traMADol (ULTRAM) 50 MG tablet Take 25 mg by mouth daily as needed (for pain.).    No facility-administered encounter medications on file as of 06/23/2018.     Review of Systems:  Review of Systems  Constitutional: Positive for malaise/fatigue and weight loss. Negative for chills and fever.  HENT: Positive for hearing loss and tinnitus. Negative for congestion, ear discharge and ear pain.   Eyes: Negative for blurred vision.  Respiratory: Negative for cough and shortness of breath.   Cardiovascular: Negative for chest pain, palpitations and leg swelling.  Gastrointestinal: Negative for abdominal pain, blood in stool, constipation, diarrhea and melena.  Genitourinary: Negative for dysuria.  Musculoskeletal: Positive for joint pain and myalgias. Negative for falls.       Right shoulder, knees  Skin: Positive for itching and rash.  Neurological: Negative for dizziness and loss of consciousness.  Endo/Heme/Allergies: Bruises/bleeds easily.  Psychiatric/Behavioral: Positive for depression. Negative for memory loss. The patient has insomnia. The patient is not nervous/anxious.        Some confusion    Health Maintenance  Topic Date Due  . INFLUENZA VACCINE  07/24/2018  . MAMMOGRAM  11/05/2019  . TETANUS/TDAP  09/01/2025  . COLONOSCOPY  10/19/2025  . DEXA SCAN  Completed  . Hepatitis C Screening  Completed  . PNA vac Low Risk Adult  Completed    Physical Exam: Vitals:   06/23/18 0834  BP: 120/70  Pulse: 61  Temp: 98 F (36.7 C)  TempSrc: Oral  SpO2: 98%    Weight: 175 lb (79.4 kg)  Height: 4' 9"  (1.448 m)   Body mass index is 37.87 kg/m. Physical Exam  Constitutional: She is oriented to person, place, and time. No distress.  HENT:  Head: Normocephalic and atraumatic.  Cardiovascular: Normal rate, regular rhythm, normal heart sounds and intact distal pulses.  Pulmonary/Chest: Effort normal and breath sounds normal. No respiratory distress.  Abdominal: Bowel sounds are normal.  Musculoskeletal:  Decreased ROM right shoulder; wide-based gait, uses cane  Neurological: She is alert and oriented to person, place, and time.  Skin: Skin is warm and dry. Capillary refill takes less than 2 seconds.  Psychiatric:  Tearful when talking about her mood, loss of her husband    Labs reviewed: Basic Metabolic Panel: Recent Labs    09/09/17 0403 12/30/17 1613  01/08/18 1150 02/12/18 1340 02/20/18 0419  NA 136 135   < > 137 137 134*  K 4.2 4.4   < > 4.0 4.9 4.9  CL 99 99   < > 104 102 102  CO2 30 28   < > 21* 26 25  GLUCOSE 91 90   < > 162* 90 151*  BUN 20 14   < > 12 21* 18  CREATININE 1.09* 0.87   < > 0.95 0.84 0.78  CALCIUM 9.5 9.7   < > 8.8* 9.3 8.7*  TSH 5.93* 0.79  --   --   --   --    < > = values in this interval not displayed.   Liver Function Tests: Recent Labs    01/27/18 1207 02/12/18 1340 02/20/18 0419  AST 21 28 70*  ALT 19 22 51  ALKPHOS 85 68 70  BILITOT 0.5 0.8 0.8  PROT 7.7 7.4 6.4*  ALBUMIN 3.5 3.4* 3.3*   Recent Labs    01/07/18 1602 01/08/18 1150  LIPASE 106* 68*  AMYLASE 61  --    Recent Labs    09/09/17 0403 02/20/18 0419  AMMONIA 72 19   CBC: Recent Labs    09/09/17 0403 12/30/17 1613 01/07/18 1602 01/08/18 1150 02/12/18 1340 02/20/18 0419  WBC 9.5 8.8 8.9 7.7 7.9 8.8  NEUTROABS 5,349 5,544 5,936  --   --   --   HGB 13.4 12.6 12.4 12.4 12.5 9.7*  HCT 39.4 37.2 36.7 36.1 37.6 29.0*  MCV 88.3 90.5 90.6 92.3 94.7 94.5  PLT 264 215 226 194 209 169    Assessment/Plan 1. Body mass  index (BMI) of 37.0-37.9 in adult -has trended down, but remains elevated -increase exercise to help weight and sleep  2. Class 2 obesity due to excess calories without serious comorbidity with body mass index (BMI) of 37.0 to 37.9 in adult -counseled on diet and exercise  3. Waldenstrom's macroglobulinemia ([place]) - f/u labs - CBC with Differential/Platelet - Protein electrophoresis, serum and urine -may be causing her tinnitus  4. Depression, recurrent ([place]) -ongoing grief and baseline depression -cont celexa, increase wellbutrin, stop melatonin which isn't helping - buPROPion (WELLBUTRIN XL) 300 MG 24 hr tablet; Take 1 tablet (300 mg total) by mouth daily.  Dispense: 90 tablet; Refill: 1  5. Alcoholic cirrhosis of liver with ascites ([place]) -ongoing, f/u labs: - Ammonia - Basic metabolic panel - Hepatic function panel -no alcohol in the house, not drinking at all  6. Hyperlipidemia, unspecified hyperlipidemia type -ate a significant breakfast this am so fasting lipids could not be done  7. Seborrheic dermatitis of scalp - gets better with nizoral so reordered - ketoconazole (NIZORAL) 2 % shampoo; Apply 1 application topically 2 (two) times a week.  Dispense: 120 mL; Refill: 3  8. Left-sided tinnitus -suspect related to her waldenstrom's   Labs/tests ordered:   Orders Placed This Encounter  Procedures  . CBC with Differential/Platelet  . Ammonia  . Protein electrophoresis, serum  . Basic metabolic panel    Order Specific Question:   Has the patient fasted?    Answer:   Yes  . Hepatic function panel  . Protein Electrophoresis,Random Urn   Next appt:  F/u 4 mos med mgt, f/u on depression   Tonjia Parillo L. Yadhira Mckneely, D.O. [facility] [address], [place] [address] Cell Phone (Mon-Fri 8am-5pm):  [phone] On Call:  [phone] & follow prompts after 5pm & weekends Office Phone:  [phone] Office Fax:   [phone]

## 2018-06-24 ENCOUNTER — Ambulatory Visit: Payer: Self-pay | Admitting: Internal Medicine

## 2018-06-24 ENCOUNTER — Other Ambulatory Visit: Payer: Self-pay | Admitting: Gastroenterology

## 2018-06-25 LAB — PROTEIN ELECTROPHORESIS,RANDOM URN
Creatinine, Urine: 39 mg/dL (ref 20–275)
Total Protein, Urine: <4 mg/dL — ABNORMAL LOW (ref 5–24)

## 2018-06-27 ENCOUNTER — Ambulatory Visit (INDEPENDENT_AMBULATORY_CARE_PROVIDER_SITE_OTHER): Payer: Medicare Other | Admitting: Gastroenterology

## 2018-06-27 ENCOUNTER — Encounter: Payer: Self-pay | Admitting: Gastroenterology

## 2018-06-27 VITALS — BP 116/70 | HR 76 | Ht <= 58 in | Wt 177.4 lb

## 2018-06-27 DIAGNOSIS — Z86010 Personal history of colonic polyps: Secondary | ICD-10-CM | POA: Diagnosis not present

## 2018-06-27 DIAGNOSIS — K7290 Hepatic failure, unspecified without coma: Secondary | ICD-10-CM | POA: Diagnosis not present

## 2018-06-27 DIAGNOSIS — I8500 Esophageal varices without bleeding: Secondary | ICD-10-CM

## 2018-06-27 DIAGNOSIS — K7682 Hepatic encephalopathy: Secondary | ICD-10-CM

## 2018-06-27 DIAGNOSIS — K7031 Alcoholic cirrhosis of liver with ascites: Secondary | ICD-10-CM

## 2018-06-27 MED ORDER — SUPREP BOWEL PREP KIT 17.5-3.13-1.6 GM/177ML PO SOLN: ORAL | 0 refills | Status: DC

## 2018-06-27 NOTE — Patient Instructions (Addendum)
If you are age 73 or older, your body mass index should be between 23-30. Your Body mass index is 38.05 kg/m. If this is out of the aforementioned range listed, please consider follow up with your Primary Care Provider.  If you are age 30 or younger, your body mass index should be between 19-25. Your Body mass index is 38.05 kg/m. If this is out of the aformentioned range listed, please consider follow up with your Primary Care Provider.   You have been scheduled for an abdominal ultrasound at [facility] (1st floor of hospital) on Tuesday, July 30th at 9:00am. Please arrive 15 minutes prior to your appointment for registration. Make certain not to have anything to eat or drink 6 hours prior to your appointment. Should you need to reschedule your appointment, please contact radiology at [phone]. This test typically takes about 30 minutes to perform.  Please go to the lab in the basement of our building in the next 2 to 3 weeks for lab work. They are open Monday through Friday from 7:30am to 5:00pm. You do not need an appointment.  Decrease Aldactone to 50 mg daily.  Discontinue taking  all NSAIDs.  You can use Tylenol instead.  Limit to 2 grams per day.  Continue Lactulose.  You have been scheduled for a colonoscopy. Please follow written instructions given to you at your visit today.  Please pick up your prep supplies at the pharmacy within the next 1-3 days. If you use inhalers (even only as needed), please bring them with you on the day of your procedure. Your physician has requested that you go to www.[email or web address].com and enter the access code given to you at your visit today. This web site gives a general overview about your procedure. However, you should still follow specific instructions given to you by our office regarding your preparation for the procedure.  Thank you for entrusting me with your care and for choosing [facility], Dr. Belgium Cellar

## 2018-06-27 NOTE — Progress Notes (Signed)
HPI :  72 y/o female here for follow up for cirrhosis. 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 She has a history of ascites, small varices, and hepatic encephalopathy.   Since last time I had seen her she developed symptoms concerning for gallstones and that she may have had choledocholithiasis and passed a stone. She was seen by Dr. Johney Maine and underwent cholecystectomy along with repair of incarcerated umbilical hernia, as well as had a liver biopsy. Her liver biopsy showed evidence of cirrhosis, some areas of "chronic inflammatory infiltrates with focal proliferating bile ducts", as well as Mallory bodies. She's had negative serologic workup for immune hepatitis, has not had AMA done yet.  She was here with her son today. In general she is feeling well. She has recovered from her surgeries. She is continuing propranolol and tolerating it. She is taking lactulose once daily. Son reports her hepatic encephalopathy is well controlled on this regimen. She recently had blood work done and her ammonia level had risen upwards of 60s.   She recently had blood work done as well which showed elevated BUN and mild elevation in creatinine. She is currently taking Lasix 56m once daily 100 mg of Aldactone. She denies any lower extremity edema or problems with ascites at present. She does endorse taking 2 ibuprofen per day for knee pain. She recently stopped this. She is not drinking any alcohol.   With otherwise received records from her prior colonoscopies as outlined below  [facility] - exam was a poor prep which was aborted in the left colon, told to repeat with 2 day prep.  10/20/2015 - Dr. IAdolph Pollack- 3 ascending polyps 5-983min size, one small sigmoid polyp removed, diverticulosis - fair bowel prep - recommended 3 year follow up  2016 - EGD - small esophageal varices, mild PHG  Past Medical History:  Diagnosis Date  .  Abnormal EKG   . Abnormal finding on thyroid function test   . Abnormal liver function test   . Acute edema   . Alcoholic cirrhosis ([id number] Possible NASH overlap (MELD 13)  . Anemia   . Anxiety   . Arthritis   . Ascites   . Back pain   . Breast mass   . Chronic low back pain   . Complete right rotator cuff tear   . Complication of anesthesia    during first surgery- woke up during surgery many years ago  . Compression fracture of L4 lumbar vertebra   . Dermatitis, eczematoid   . Difficulty breathing   . Gallstones   . History of adenocarcinoma of breast   . Hypercholesterolemia   . Hyperkalemia   . Hypertension   . Hypokalemia   . Hypothyroidism   . Insomnia   . Knee pain   . Leukocytosis   . Lymphadenopathy   . Macrocytosis   . Memory loss or impairment   . Menopause   . MGUS (monoclonal gammopathy of unknown significance)   . Osteoarthritis   . Osteoarthritis of right knee   . Other cirrhosis of liver ([facility]  . Renal insufficiency syndrome   . Right knee DJD   . Solitary thyroid nodule   . Unspecified lump in the left breast, unspecified quadrant   . Vitamin B 12 deficiency   . 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HCPajonal     Past Surgical History:  Procedure Laterality Date  .  BREAST SURGERY  2014   Removed lymphnodes  . EYE SURGERY  2006   bilateral cataract  . JOINT REPLACEMENT     right  (2017)and left knee  . LAPAROSCOPIC CHOLECYSTECTOMY SINGLE SITE WITH INTRAOPERATIVE CHOLANGIOGRAM N/A 02/19/2018   Procedure: LAPAROSCOPIC CHOLECYSTECTOMY;  Surgeon: [name], MD;  Location: [facility];  Service: General;  Laterality: N/A;  . LESION REMOVAL  09/2015   tubular adenoma-4 subcentimeter lesions  . LIVER BIOPSY  02/19/2018   Procedure: LIVER BIOPSY;  Surgeon: [name], MD;  Location: [facility];  Service: General;;  . right thumb surgery    . TONSILLECTOMY    . TOTAL KNEE ARTHROPLASTY Right 09/05/2016  . UMBILICAL HERNIA REPAIR  02/19/2018    Procedure: REPAIR OF INCARCERATED UMBILICAL HERNIA;  Surgeon: [name], MD;  Location: [facility];  Service: General;;   Family History  Problem Relation Age of Onset  . Breast cancer Mother   . Colon cancer Neg Hx   . Stomach cancer Neg Hx   . Rectal cancer Neg Hx   . Liver cancer Neg Hx   . Esophageal cancer Neg Hx    Social History   Tobacco Use  . Smoking status: Never Smoker  . Smokeless tobacco: Never Used  Substance Use Topics  . Alcohol use: No    Comment: 6 or more per day in the past.   . Drug use: No   Current Outpatient Medications  Medication Sig Dispense Refill  . buPROPion (WELLBUTRIN XL) 300 MG 24 hr tablet Take 1 tablet (300 mg total) by mouth daily. 90 tablet 1  . Cholecalciferol (VITAMIN D3) 2000 units TABS Take 1 tablet by mouth every morning.    . citalopram (CELEXA) 40 MG tablet Take 1 tablet (40 mg total) by mouth daily. 90 tablet 3  . furosemide (LASIX) 20 MG tablet Take 1 tablet (20 mg total) by mouth every morning. 90 tablet 3  . ibuprofen (ADVIL,MOTRIN) 200 MG tablet Take 400 mg by mouth daily as needed (for pain.).     Marland Kitchen ketoconazole (NIZORAL) 2 % shampoo Apply 1 application topically 2 (two) times a week. 120 mL 3  . lactulose (CHRONULAC) 10 GM/15ML solution TAKE 15 ML'S BY MOUTH THREE TIMES A DAY 240 mL 1  . levothyroxine (SYNTHROID, LEVOTHROID) 88 MCG tablet TAKE 1 TABLET BY MOUTH ONCE A  DAY 30 MINUTES BEFORE BREAKFAST AND BEFORE ANY OTHER MEDICATIONS 90 tablet 0  . Multiple Vitamin (MULTIVITAMIN) tablet Take 1 tablet by mouth daily.    . potassium chloride (K-DUR) 10 MEQ tablet TAKE ONE TABLET BY MOUTH DAILY 60 tablet 3  . propranolol (INDERAL) 20 MG tablet Take 1 tablet (20 mg total) by mouth every morning. 90 tablet 3  . spironolactone (ALDACTONE) 100 MG tablet Take 100 mg by mouth every morning.    . vitamin B-12 (CYANOCOBALAMIN) 1000 MCG tablet Take 1,000 mcg by mouth every morning.     No current facility-administered medications for this  visit.    No Known Allergies   Review of Systems: All systems reviewed and negative except where noted in HPI.   Lab Results  Component Value Date   WBC 8.1 06/23/2018   HGB 12.1 06/23/2018   HCT 36.2 06/23/2018   MCV 88.7 06/23/2018   PLT 218 06/23/2018    Lab Results  Component Value Date   ALT 27 06/23/2018   AST 27 06/23/2018   ALKPHOS 70 02/20/2018   BILITOT 0.5 06/23/2018    Lab Results  Component Value Date   CREATININE 1.14 (H) 06/23/2018   BUN 33 (H) 06/23/2018   NA 140 06/23/2018   K 4.4 06/23/2018   CL 102 06/23/2018   CO2 31 06/23/2018    Lab Results  Component Value Date   INR 1.21 01/08/2018   INR 1.2 (H) 01/02/2018   INR 1.1 (H) 05/06/2017      Physical Exam: BP 116/70 (BP Location: Left Arm, Patient Position: Sitting, Cuff Size: Normal)   Pulse 76   Ht 4' 9.25" (1.454 m)   Wt 177 lb 6 oz (80.5 kg)   BMI 38.05 kg/m  Constitutional: Pleasant,well-developed, female in no acute distress. HEENT: Normocephalic and atraumatic. Conjunctivae are normal. No scleral icterus. Neck supple.  Cardiovascular: Normal rate, regular rhythm.  Pulmonary/chest: Effort normal and breath sounds normal. No wheezing, rales or rhonchi. Abdominal: Soft, nondistended, nontender. There are no masses palpable. No hepatomegaly. Extremities: no edema Lymphadenopathy: No cervical adenopathy noted. Neurological: Alert and oriented to person place and time. No asterixis Skin: Skin is warm and dry. No rashes noted. Psychiatric: Normal mood and affect. Behavior is normal.   ASSESSMENT AND PLAN: 72 year old female here for reassessment following issues:  Cirrhosis - presumed due to alcohol History of ascites Esophageal varices Hepatic encephalopathy History of colon polyps - poor prep on last colonoscopy  Recommend the following: - she is due for right upper quadrant ultrasound and AFP level for [place] screening, we'll do this every 6 months - in light of her  worsening renal function recommend complete cessation of NSAIDs. I counseled her she can use Tylenol as needed for pain,limited to 2 g per day in light of her history of cirrhosis - will decrease Aldactone to 50 mg per day and continue Lasix at 20 mg per day. If she has worsening edema she will contact us. Repeat BMET in 2 weeks - plan for repeat renal function testing in 2 weeks, we'll also check INR at that time as well as AMA to ensure negative given liver biopsy results - continue lactulose at present dosing, her encephalopathy is well controlled. There is no role for surveillance of ammonia levels in the treatment of encephalopathy and discussed this with the patient and son, would not check it moving forward (they had questions about this). Will treat the patient's encephalopathy based on clinical course, she's doing well on this regimen so will continue it.  - given the patient's fair bowel prep on her last exam she would be due for a surveillance colonoscopy at this time. I discussed with her what this would entail and she wishes to proceed. Given she is on chronic lactulose now with chronic diarrhea, hopefully her prep will be improved. She can use additional miralax / gatorade if addition prep is needed  Follow up in 6 months otherwise for her cirrhosis.  [place] Cellar, MD [facility]

## 2018-06-30 ENCOUNTER — Ambulatory Visit: Payer: Self-pay | Admitting: Internal Medicine

## 2018-06-30 LAB — CBC WITH DIFFERENTIAL/PLATELET
Basophils Absolute: 49 cells/uL (ref 0–200)
Basophils Relative: 0.6 %
Eosinophils Absolute: 113 cells/uL (ref 15–500)
Eosinophils Relative: 1.4 %
HCT: 36.2 % (ref 35.0–45.0)
Hemoglobin: 12.1 g/dL (ref 11.7–15.5)
Lymphs Abs: 2017 cells/uL (ref 850–3900)
MCH: 29.7 pg (ref 27.0–33.0)
MCHC: 33.4 g/dL (ref 32.0–36.0)
MCV: 88.7 fL (ref 80.0–100.0)
MPV: 10.6 fL (ref 7.5–12.5)
Monocytes Relative: 12.1 %
Neutro Abs: 4941 cells/uL (ref 1500–7800)
Neutrophils Relative %: 61 %
Platelets: 218 10*3/uL (ref 140–400)
RBC: 4.08 10*6/uL (ref 3.80–5.10)
RDW: 12.3 % (ref 11.0–15.0)
Total Lymphocyte: 24.9 %
WBC mixed population: 980 cells/uL — ABNORMAL HIGH (ref 200–950)
WBC: 8.1 10*3/uL (ref 3.8–10.8)

## 2018-06-30 LAB — HEPATIC FUNCTION PANEL
AG Ratio: 1.1 (calc) (ref 1.0–2.5)
ALT: 27 U/L (ref 6–29)
AST: 27 U/L (ref 10–35)
Albumin: 3.7 g/dL (ref 3.6–5.1)
Alkaline phosphatase (APISO): 73 U/L (ref 33–130)
Bilirubin, Direct: 0.1 mg/dL (ref 0.0–0.2)
Globulin: 3.5 g/dL (calc) (ref 1.9–3.7)
Indirect Bilirubin: 0.4 mg/dL (calc) (ref 0.2–1.2)
Total Bilirubin: 0.5 mg/dL (ref 0.2–1.2)
Total Protein: 7.2 g/dL (ref 6.1–8.1)

## 2018-06-30 LAB — BASIC METABOLIC PANEL
BUN/Creatinine Ratio: 29 (calc) — ABNORMAL HIGH (ref 6–22)
BUN: 33 mg/dL — ABNORMAL HIGH (ref 7–25)
CO2: 31 mmol/L (ref 20–32)
Calcium: 9.8 mg/dL (ref 8.6–10.4)
Chloride: 102 mmol/L (ref 98–110)
Creat: 1.14 mg/dL — ABNORMAL HIGH (ref 0.60–0.93)
Glucose, Bld: 78 mg/dL (ref 65–139)
Potassium: 4.4 mmol/L (ref 3.5–5.3)
Sodium: 140 mmol/L (ref 135–146)

## 2018-06-30 LAB — PROTEIN ELECTROPHORESIS, SERUM
Abnormal Protein Band1: 1.4 g/dL — ABNORMAL HIGH
Albumin ELP: 3.7 g/dL — ABNORMAL LOW (ref 3.8–4.8)
Alpha 1: 0.3 g/dL (ref 0.2–0.3)
Alpha 2: 0.8 g/dL (ref 0.5–0.9)
Beta 2: 0.2 g/dL (ref 0.2–0.5)
Beta Globulin: 0.3 g/dL — ABNORMAL LOW (ref 0.4–0.6)
Gamma Globulin: 1.7 g/dL (ref 0.8–1.7)
Total Protein: 7.1 g/dL (ref 6.1–8.1)

## 2018-06-30 LAB — IFE INTERPRETATION: Immunofix Electr Int: DETECTED

## 2018-06-30 LAB — TEST AUTHORIZATION

## 2018-06-30 LAB — AMMONIA: Ammonia: 62 umol/L (ref <=72)

## 2018-07-14 ENCOUNTER — Ambulatory Visit (INDEPENDENT_AMBULATORY_CARE_PROVIDER_SITE_OTHER): Payer: Medicare Other | Admitting: Gastroenterology

## 2018-07-14 ENCOUNTER — Other Ambulatory Visit (INDEPENDENT_AMBULATORY_CARE_PROVIDER_SITE_OTHER): Payer: Medicare Other

## 2018-07-14 DIAGNOSIS — Z86010 Personal history of colonic polyps: Secondary | ICD-10-CM | POA: Diagnosis not present

## 2018-07-14 DIAGNOSIS — K7682 Hepatic encephalopathy: Secondary | ICD-10-CM

## 2018-07-14 DIAGNOSIS — K7031 Alcoholic cirrhosis of liver with ascites: Secondary | ICD-10-CM

## 2018-07-14 DIAGNOSIS — K7290 Hepatic failure, unspecified without coma: Secondary | ICD-10-CM

## 2018-07-14 DIAGNOSIS — Z23 Encounter for immunization: Secondary | ICD-10-CM | POA: Diagnosis not present

## 2018-07-14 DIAGNOSIS — I8500 Esophageal varices without bleeding: Secondary | ICD-10-CM

## 2018-07-14 LAB — BASIC METABOLIC PANEL
BUN: 12 mg/dL (ref 6–23)
CHLORIDE: 101 meq/L (ref 96–112)
CO2: 29 meq/L (ref 19–32)
CREATININE: 1.01 mg/dL (ref 0.40–1.20)
Calcium: 9.4 mg/dL (ref 8.4–10.5)
GFR: 57.02 mL/min — ABNORMAL LOW (ref >60.00)
GLUCOSE: 103 mg/dL — AB (ref 70–99)
Potassium: 4.1 mEq/L (ref 3.5–5.1)
Sodium: 138 mEq/L (ref 135–145)

## 2018-07-14 LAB — PROTIME-INR
INR: 1.2 ratio — ABNORMAL HIGH (ref 0.8–1.0)
PROTHROMBIN TIME: 13.5 s — AB (ref 9.6–13.1)

## 2018-07-16 LAB — MITOCHONDRIAL ANTIBODIES

## 2018-07-16 LAB — AFP TUMOR MARKER: AFP TUMOR MARKER: 2.5 ng/mL

## 2018-07-18 ENCOUNTER — Other Ambulatory Visit: Payer: Self-pay | Admitting: Emergency Medicine

## 2018-07-18 MED ORDER — SPIRONOLACTONE 100 MG PO TABS: 50.0000 mg | ORAL_TABLET | Freq: Every day | ORAL | 0 refills | Status: DC

## 2018-07-18 MED ORDER — FUROSEMIDE 20 MG PO TABS: 20.0000 mg | ORAL_TABLET | Freq: Every morning | ORAL | 3 refills | Status: DC

## 2018-07-19 ENCOUNTER — Other Ambulatory Visit: Payer: Self-pay | Admitting: Internal Medicine

## 2018-07-19 DIAGNOSIS — I8510 Secondary esophageal varices without bleeding: Secondary | ICD-10-CM

## 2018-07-19 DIAGNOSIS — K7030 Alcoholic cirrhosis of liver without ascites: Principal | ICD-10-CM

## 2018-07-22 ENCOUNTER — Ambulatory Visit (HOSPITAL_COMMUNITY)
Admission: RE | Admit: 2018-07-22 | Discharge: 2018-07-22 | Disposition: A | Discharge status: Home / Self Care | Payer: Medicare Other | Source: Ambulatory Visit | Attending: Gastroenterology | Admitting: Gastroenterology

## 2018-07-22 DIAGNOSIS — K7290 Hepatic failure, unspecified without coma: Secondary | ICD-10-CM | POA: Diagnosis not present

## 2018-07-22 DIAGNOSIS — Z86010 Personal history of colonic polyps: Secondary | ICD-10-CM | POA: Diagnosis not present

## 2018-07-22 DIAGNOSIS — K7031 Alcoholic cirrhosis of liver with ascites: Secondary | ICD-10-CM

## 2018-07-22 DIAGNOSIS — I8500 Esophageal varices without bleeding: Secondary | ICD-10-CM | POA: Diagnosis not present

## 2018-07-22 DIAGNOSIS — K7682 Hepatic encephalopathy: Secondary | ICD-10-CM

## 2018-07-22 DIAGNOSIS — Z9049 Acquired absence of other specified parts of digestive tract: Secondary | ICD-10-CM | POA: Diagnosis not present

## 2018-07-22 DIAGNOSIS — C229 Malignant neoplasm of liver, not specified as primary or secondary: Secondary | ICD-10-CM | POA: Diagnosis not present

## 2018-07-23 ENCOUNTER — Other Ambulatory Visit: Payer: Self-pay

## 2018-07-23 DIAGNOSIS — R932 Abnormal findings on diagnostic imaging of liver and biliary tract: Secondary | ICD-10-CM

## 2018-07-23 DIAGNOSIS — K7031 Alcoholic cirrhosis of liver with ascites: Secondary | ICD-10-CM

## 2018-07-31 ENCOUNTER — Other Ambulatory Visit: Payer: Self-pay

## 2018-07-31 ENCOUNTER — Telehealth: Payer: Self-pay | Admitting: Gastroenterology

## 2018-07-31 DIAGNOSIS — K7031 Alcoholic cirrhosis of liver with ascites: Secondary | ICD-10-CM

## 2018-07-31 NOTE — Telephone Encounter (Signed)
Daughter called back.  She understands to have mom, pt, hold K.  Will go to the lab in a month to recheck.  Lab ordered.

## 2018-07-31 NOTE — Telephone Encounter (Signed)
Called and Left detailed message for

## 2018-07-31 NOTE — Telephone Encounter (Signed)
Left detailed message for pt regarding stopping potassium and having labs redone in 1 month.  Asked her to call me back to confirm.

## 2018-07-31 NOTE — Progress Notes (Signed)
Discontinue Potassium per SA.

## 2018-07-31 NOTE — Telephone Encounter (Signed)
Jan,   I got a notice from pharmacy that this patient, while on lasix and aldactone, is also on K supplement. Her K has been normal on labs, but she is on a low dose, I don't think she needs it currently. Can you have her stop the K. I would check a BMET in 1 month to ensure stable. Thanks

## 2018-08-28 ENCOUNTER — Other Ambulatory Visit: Payer: Self-pay

## 2018-08-28 MED ORDER — LACTULOSE 10 GM/15ML PO SOLN: ORAL | 1 refills | Status: DC

## 2018-08-28 MED ORDER — SPIRONOLACTONE 100 MG PO TABS: 50.0000 mg | ORAL_TABLET | Freq: Every day | ORAL | 1 refills | Status: DC

## 2018-08-28 NOTE — Progress Notes (Signed)
Per patient request, sent to [facility] on [address]

## 2018-09-01 ENCOUNTER — Other Ambulatory Visit (INDEPENDENT_AMBULATORY_CARE_PROVIDER_SITE_OTHER): Payer: Medicare Other

## 2018-09-01 ENCOUNTER — Ambulatory Visit (HOSPITAL_COMMUNITY)
Admission: RE | Admit: 2018-09-01 | Discharge: 2018-09-01 | Disposition: A | Discharge status: Home / Self Care | Payer: Medicare Other | Source: Ambulatory Visit | Attending: Gastroenterology | Admitting: Gastroenterology

## 2018-09-01 DIAGNOSIS — R188 Other ascites: Secondary | ICD-10-CM | POA: Diagnosis not present

## 2018-09-01 DIAGNOSIS — K7031 Alcoholic cirrhosis of liver with ascites: Secondary | ICD-10-CM | POA: Diagnosis not present

## 2018-09-01 DIAGNOSIS — K7460 Unspecified cirrhosis of liver: Secondary | ICD-10-CM | POA: Diagnosis not present

## 2018-09-01 DIAGNOSIS — R932 Abnormal findings on diagnostic imaging of liver and biliary tract: Secondary | ICD-10-CM

## 2018-09-01 DIAGNOSIS — K769 Liver disease, unspecified: Secondary | ICD-10-CM | POA: Diagnosis not present

## 2018-09-01 LAB — POCT I-STAT CREATININE: Creatinine, Ser: 0.9 mg/dL (ref 0.44–1.00)

## 2018-09-01 LAB — BASIC METABOLIC PANEL
BUN: 15 mg/dL (ref 6–23)
CO2: 29 meq/L (ref 19–32)
CREATININE: 0.93 mg/dL (ref 0.40–1.20)
Calcium: 9.5 mg/dL (ref 8.4–10.5)
Chloride: 101 mEq/L (ref 96–112)
GFR: 62.69 mL/min (ref >60.00)
GLUCOSE: 87 mg/dL (ref 70–99)
POTASSIUM: 4.4 meq/L (ref 3.5–5.1)
Sodium: 138 mEq/L (ref 135–145)

## 2018-09-01 MED ORDER — GADOBUTROL 1 MMOL/ML IV SOLN
10.0000 mL | Freq: Once | INTRAVENOUS | Status: AC | PRN
  Administered 2018-09-01: 9 mL via INTRAVENOUS

## 2018-09-02 MED ORDER — SPIRONOLACTONE 50 MG PO TABS: 50.0000 mg | ORAL_TABLET | Freq: Every day | ORAL | 1 refills | Status: DC

## 2018-09-02 NOTE — Addendum Note (Signed)
Addended by: Larina Bras on: 09/02/2018 05:00 PM   Modules accepted: Orders

## 2018-09-05 DIAGNOSIS — H2513 Age-related nuclear cataract, bilateral: Secondary | ICD-10-CM | POA: Diagnosis not present

## 2018-09-05 DIAGNOSIS — H04123 Dry eye syndrome of bilateral lacrimal glands: Secondary | ICD-10-CM | POA: Diagnosis not present

## 2018-09-10 ENCOUNTER — Encounter: Payer: Self-pay | Admitting: Gastroenterology

## 2018-09-10 ENCOUNTER — Ambulatory Visit (AMBULATORY_SURGERY_CENTER): Payer: Medicare Other | Admitting: Gastroenterology

## 2018-09-10 VITALS — BP 105/62 | HR 75 | Temp 97.8°F | Resp 16 | Ht <= 58 in | Wt 177.0 lb

## 2018-09-10 DIAGNOSIS — D122 Benign neoplasm of ascending colon: Secondary | ICD-10-CM | POA: Diagnosis not present

## 2018-09-10 DIAGNOSIS — K635 Polyp of colon: Secondary | ICD-10-CM | POA: Diagnosis not present

## 2018-09-10 DIAGNOSIS — D120 Benign neoplasm of cecum: Secondary | ICD-10-CM | POA: Diagnosis not present

## 2018-09-10 DIAGNOSIS — D123 Benign neoplasm of transverse colon: Secondary | ICD-10-CM | POA: Diagnosis not present

## 2018-09-10 DIAGNOSIS — Z860100 Personal history of colon polyps, unspecified: Secondary | ICD-10-CM

## 2018-09-10 DIAGNOSIS — D649 Anemia, unspecified: Secondary | ICD-10-CM | POA: Diagnosis not present

## 2018-09-10 DIAGNOSIS — F419 Anxiety disorder, unspecified: Secondary | ICD-10-CM | POA: Diagnosis not present

## 2018-09-10 DIAGNOSIS — K7030 Alcoholic cirrhosis of liver without ascites: Secondary | ICD-10-CM | POA: Diagnosis not present

## 2018-09-10 MED ORDER — SODIUM CHLORIDE 0.9 % IV SOLN: 500.0000 mL | Freq: Once | INTRAVENOUS | Status: DC

## 2018-09-10 NOTE — Progress Notes (Signed)
Pt's states no medical or surgical changes since previsit or office visit. 

## 2018-09-10 NOTE — Patient Instructions (Signed)
Continue present medications. Please read handouts on Polyps, Hemorrhoids, and Diverticulosis.     YOU HAD AN ENDOSCOPIC PROCEDURE TODAY AT [facility]:   Refer to the procedure report that was given to you for any specific questions about what was found during the examination.  If the procedure report does not answer your questions, please call your gastroenterologist to clarify.  If you requested that your care partner not be given the details of your procedure findings, then the procedure report has been included in a sealed envelope for you to review at your convenience later.  YOU SHOULD EXPECT: Some feelings of bloating in the abdomen. Passage of more gas than usual.  Walking can help get rid of the air that was put into your GI tract during the procedure and reduce the bloating. If you had a lower endoscopy (such as a colonoscopy or flexible sigmoidoscopy) you may notice spotting of blood in your stool or on the toilet paper. If you underwent a bowel prep for your procedure, you may not have a normal bowel movement for a few days.  Please Note:  You might notice some irritation and congestion in your nose or some drainage.  This is from the oxygen used during your procedure.  There is no need for concern and it should clear up in a day or so.  SYMPTOMS TO REPORT IMMEDIATELY:   Following lower endoscopy (colonoscopy or flexible sigmoidoscopy):  Excessive amounts of blood in the stool  Significant tenderness or worsening of abdominal pains  Swelling of the abdomen that is new, acute  Fever of 100F or higher   For urgent or emergent issues, a gastroenterologist can be reached at any hour by calling [phone].   DIET:  We do recommend a small meal at first, but then you may proceed to your regular diet.  Drink plenty of fluids but you should avoid alcoholic beverages for 24 hours.  ACTIVITY:  You should plan to take it easy for the rest of today and you should NOT  DRIVE or use heavy machinery until tomorrow (because of the sedation medicines used during the test).    FOLLOW UP: Our staff will call the number listed on your records the next business day following your procedure to check on you and address any questions or concerns that you may have regarding the information given to you following your procedure. If we do not reach you, we will leave a message.  However, if you are feeling well and you are not experiencing any problems, there is no need to return our call.  We will assume that you have returned to your regular daily activities without incident.  If any biopsies were taken you will be contacted by phone or by letter within the next 1-3 weeks.  Please call us at [phone] if you have not heard about the biopsies in 3 weeks.    SIGNATURES/CONFIDENTIALITY: You and/or your care partner have signed paperwork which will be entered into your electronic medical record.  These signatures attest to the fact that that the information above on your After Visit Summary has been reviewed and is understood.  Full responsibility of the confidentiality of this discharge information lies with you and/or your care-partner.

## 2018-09-10 NOTE — Progress Notes (Signed)
Called to room to assist during endoscopic procedure.  Patient ID and intended procedure confirmed with present staff. Received instructions for my participation in the procedure from the performing physician.  

## 2018-09-10 NOTE — Op Note (Signed)
[facility] Patient Name: Christina [name] Procedure Date: 09/10/2018 10:57 AM MRN: [id number] Endoscopist: Remo Lipps P. Havery Moros , MD Age: 72 Referring MD:  Date of Birth: 21-Apr-1946 Gender: Female Account #: [id number] Procedure:                Colonoscopy Indications:              High risk colon cancer surveillance: Personal                            history of colonic polyps Medicines:                Monitored Anesthesia Care Procedure:                Pre-Anesthesia Assessment:                           - Prior to the procedure, a History and Physical                            was performed, and patient medications and                            allergies were reviewed. The patient's tolerance of                            previous anesthesia was also reviewed. The risks                            and benefits of the procedure and the sedation                            options and risks were discussed with the patient.                            All questions were answered, and informed consent                            was obtained. Prior Anticoagulants: The patient has                            taken no previous anticoagulant or antiplatelet                            agents. ASA Grade Assessment: III - A patient with                            severe systemic disease. After reviewing the risks                            and benefits, the patient was deemed in                            satisfactory condition to undergo the procedure.  After obtaining informed consent, the colonoscope                            was passed under direct vision. Throughout the                            procedure, the patient's blood pressure, pulse, and                            oxygen saturations were monitored continuously. The                            Colonoscope was introduced through the anus and                            advanced to the the cecum,  identified by                            appendiceal orifice and ileocecal valve. The                            colonoscopy was performed without difficulty. The                            patient tolerated the procedure well. The quality                            of the bowel preparation was good. The ileocecal                            valve, appendiceal orifice, and rectum were                            photographed. Scope In: 11:05:43 AM Scope Out: 11:31:57 AM Scope Withdrawal Time: 0 hours 22 minutes 3 seconds  Total Procedure Duration: 0 hours 26 minutes 14 seconds  Findings:                 The perianal and digital rectal examinations were                            normal.                           Two sessile polyps were found in the cecum. The                            polyps were 4 to 8 mm in size. These polyps were                            removed with a cold snare. Resection and retrieval                            were complete.  Two sessile polyps were found in the ascending                            colon. The polyps were 3 to 5 mm in size. These                            polyps were removed with a cold snare. Resection                            and retrieval were complete.                           Four sessile polyps were found in the transverse                            colon. The polyps were 3 to 4 mm in size. These                            polyps were removed with a cold snare. Resection                            and retrieval were complete.                           Many small and large-mouthed diverticula were found                            in the entire colon.                           Internal hemorrhoids were found during retroflexion.                           The exam was otherwise without abnormality. Complications:            No immediate complications. Estimated blood loss:                             Minimal. Estimated Blood Loss:     Estimated blood loss was minimal. Impression:               - Two 4 to 8 mm polyps in the cecum, removed with a                            cold snare. Resected and retrieved.                           - Two 3 to 5 mm polyps in the ascending colon,                            removed with a cold snare. Resected and retrieved.                           - Four 3 to 4  mm polyps in the transverse colon,                            removed with a cold snare. Resected and retrieved.                           - Diverticulosis in the entire examined colon.                           - Internal hemorrhoids.                           - The examination was otherwise normal. Recommendation:           - Patient has a contact number available for                            emergencies. The signs and symptoms of potential                            delayed complications were discussed with the                            patient. Return to normal activities tomorrow.                            Written discharge instructions were provided to the                            patient.                           - Resume previous diet.                           - Continue present medications.                           - Await pathology results.                           - Repeat colonoscopy for surveillance based on                            pathology results. Remo Lipps P. Armbruster, MD 09/10/2018 11:38:16 AM This report has been signed electronically.

## 2018-09-11 ENCOUNTER — Telehealth: Payer: Self-pay

## 2018-09-11 ENCOUNTER — Telehealth: Payer: Self-pay | Admitting: *Deleted

## 2018-09-11 NOTE — Telephone Encounter (Signed)
First follow up call attempt.  Voicemail with no identifier.  No message left.

## 2018-09-11 NOTE — Telephone Encounter (Signed)
  Follow up Call-  Call back number 09/10/2018  Post procedure Call Back phone  # [phone] number ok to call.  Permission to leave phone message Yes  Some recent data might be hidden     Patient questions:  Do you have a fever, pain , or abdominal swelling? No. Pain Score  0 *  Have you tolerated food without any problems? Yes.    Have you been able to return to your normal activities? Yes.    Do you have any questions about your discharge instructions: Diet   No. Medications  No. Follow up visit  No.  Do you have questions or concerns about your Care? No.  Actions: * If pain score is 4 or above: No action needed, pain <4.  No problems noted per pt. maw

## 2018-10-06 ENCOUNTER — Ambulatory Visit (INDEPENDENT_AMBULATORY_CARE_PROVIDER_SITE_OTHER): Payer: Medicare Other | Admitting: Internal Medicine

## 2018-10-06 ENCOUNTER — Encounter: Payer: Self-pay | Admitting: Internal Medicine

## 2018-10-06 VITALS — BP 120/70 | HR 67 | Temp 98.0°F | Ht <= 58 in | Wt 172.0 lb

## 2018-10-06 DIAGNOSIS — Z23 Encounter for immunization: Secondary | ICD-10-CM

## 2018-10-06 DIAGNOSIS — E890 Postprocedural hypothyroidism: Secondary | ICD-10-CM

## 2018-10-06 DIAGNOSIS — Z6837 Body mass index (BMI) 37.0-37.9, adult: Secondary | ICD-10-CM | POA: Diagnosis not present

## 2018-10-06 DIAGNOSIS — F339 Major depressive disorder, recurrent, unspecified: Secondary | ICD-10-CM | POA: Diagnosis not present

## 2018-10-06 DIAGNOSIS — E559 Vitamin D deficiency, unspecified: Secondary | ICD-10-CM | POA: Diagnosis not present

## 2018-10-06 DIAGNOSIS — R269 Unspecified abnormalities of gait and mobility: Secondary | ICD-10-CM | POA: Insufficient documentation

## 2018-10-06 DIAGNOSIS — R160 Hepatomegaly, not elsewhere classified: Secondary | ICD-10-CM | POA: Insufficient documentation

## 2018-10-06 DIAGNOSIS — E6609 Other obesity due to excess calories: Secondary | ICD-10-CM

## 2018-10-06 DIAGNOSIS — K7031 Alcoholic cirrhosis of liver with ascites: Secondary | ICD-10-CM | POA: Diagnosis not present

## 2018-10-06 DIAGNOSIS — C880 Waldenstrom macroglobulinemia: Secondary | ICD-10-CM

## 2018-10-06 NOTE — Progress Notes (Signed)
Location:  [facility] clinic Provider:  Mendy Lapinsky L. Mariea Clonts, D.O., C.M.D.  Code Status: DNR, MOST Goals of Care:  Advanced Directives 06/23/2018  Does Patient Have a Medical Advance Directive? Yes  Type of Advance Directive Out of facility DNR (pink MOST or yellow form)  Does patient want to make changes to medical advance directive? No - Patient declined  Copy of [place] in Chart? -  Pre-existing out of facility DNR order (yellow form or pink MOST form) Yellow form placed in chart (order not valid for inpatient use);Pink MOST form placed in chart (order not valid for inpatient use)   Chief Complaint  Patient presents with  . Medical Management of Chronic Issues    41mh follow-up    HPI: Patient is a 72y.o. female seen today for medical management of chronic diseases.    Says in general, she is doing pretty good.   She is still seeing the bereavement counselor.  She is tired lie you wouldn't believe.  She doesn't do that much to be so tired.  Thyroid had been changed.    We had changed her antidepressant and has now slept until 2pm and went back to bed at nighttime.  She's had ringing in her ears which is getting louder.  It's a humming sound.  No change in hearing for sure that she notices.  She still wakens from slight sounds like she did when her husband would sneak out of bed.    She's getting dizzy more than she did.  Her gait is worse.  She is not trusting herself lately.  Has not fallen.  She uses her cane out and walker in the house.  May use furniture between rooms.  She's not sure of herself.    Pt feels like her scalp is better.  The medicated shampoo dries it up too much.    She's for a f/u MRI of her liver due to a spot noted on it with GI.  Weight is down 5 more lbs.  Has a feeling of tremor inside.  Does not herself notice it outside.  Has some atrophy of her thenar eminences.  She notes numbness of her hands overnight and they take some time to work out  due to stiffness.  Had prior   Talking with her daughter, DJackelyn Poling she notes that pt is sleeping typically till about 11am and only eats two meals.  She buys large amounts of meat and cooks once and eats the same thing for days.  She does not want to do anything.  Sits around all day.    Past Medical History:  Diagnosis Date  . Abnormal EKG   . Abnormal finding on thyroid function test   . Abnormal liver function test   . Acute edema   . 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 Anemia   . Anxiety   . Arthritis   . Ascites   . Back pain   . Breast mass   . Chronic low back pain   . Complete right rotator cuff tear   . Complication of anesthesia    during first surgery- woke up during surgery many years ago  . Compression fracture of L4 lumbar vertebra   . Dermatitis, eczematoid   . Difficulty breathing   . Gallstones   . History of adenocarcinoma of breast   . Hypercholesterolemia   . Hyperkalemia   . Hypertension   . Hypokalemia   .  Hypothyroidism   . Insomnia   . Knee pain   . Leukocytosis   . Lymphadenopathy   . Macrocytosis   . Memory loss or impairment   . Menopause   . MGUS (monoclonal gammopathy of unknown significance)   . Osteoarthritis   . Osteoarthritis of right knee   . Other cirrhosis of liver ([place])   . Renal insufficiency syndrome   . Right knee DJD   . Solitary thyroid nodule   . Unspecified lump in the left breast, unspecified quadrant   . Vitamin B 12 deficiency   . 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[place])     Past Surgical History:  Procedure Laterality Date  . BREAST SURGERY  2014   Removed lymphnodes  . EYE SURGERY  2006   bilateral cataract  . JOINT REPLACEMENT     right  (2017)and left knee  . LAPAROSCOPIC CHOLECYSTECTOMY SINGLE SITE WITH INTRAOPERATIVE CHOLANGIOGRAM N/A 02/19/2018   Procedure: LAPAROSCOPIC CHOLECYSTECTOMY;  Surgeon: [name], MD;  Location: [facility];  Service: General;  Laterality: N/A;  .  LESION REMOVAL  09/2015   tubular adenoma-4 subcentimeter lesions  . LIVER BIOPSY  02/19/2018   Procedure: LIVER BIOPSY;  Surgeon: [name], MD;  Location: [facility];  Service: General;;  . right thumb surgery    . TONSILLECTOMY    . TOTAL KNEE ARTHROPLASTY Right 09/05/2016  . UMBILICAL HERNIA REPAIR  02/19/2018   Procedure: REPAIR OF INCARCERATED UMBILICAL HERNIA;  Surgeon: [name], MD;  Location: [facility];  Service: General;;    No Known Allergies  Outpatient Encounter Medications as of 10/06/2018  Medication Sig  . buPROPion (WELLBUTRIN XL) 300 MG 24 hr tablet Take 1 tablet (300 mg total) by mouth daily.  . Cholecalciferol (VITAMIN D3) 2000 units TABS Take 1 tablet by mouth every morning.  . citalopram (CELEXA) 40 MG tablet Take 1 tablet (40 mg total) by mouth daily.  . furosemide (LASIX) 20 MG tablet Take 1 tablet (20 mg total) by mouth every morning.  Marland Kitchen ketoconazole (NIZORAL) 2 % shampoo Apply 1 application topically 2 (two) times a week.  . lactulose (CHRONULAC) 10 GM/15ML solution TAKE 15 MLs BY MOUTH THREE TIMES A DAY  . levothyroxine (SYNTHROID, LEVOTHROID) 88 MCG tablet TAKE 1 TABLET BY MOUTH ONCE A  DAY 30 MINUTES BEFORE BREAKFAST AND BEFORE ANY OTHER MEDICATIONS  . Multiple Vitamin (MULTIVITAMIN) tablet Take 1 tablet by mouth daily.  . propranolol (INDERAL) 20 MG tablet TAKE ONE TABLET BY MOUTH EVERY MORNING  . spironolactone (ALDACTONE) 50 MG tablet Take 1 tablet (50 mg total) by mouth daily. Pharmacy-please d/c rx for spironolactone 100 mg dosage  . vitamin B-12 (CYANOCOBALAMIN) 1000 MCG tablet Take 1,000 mcg by mouth every morning.  . [DISCONTINUED] ibuprofen (ADVIL,MOTRIN) 200 MG tablet Take 400 mg by mouth daily as needed (for pain.).    No facility-administered encounter medications on file as of 10/06/2018.     Review of Systems:  Review of Systems  Constitutional: Positive for malaise/fatigue and weight loss. Negative for chills, diaphoresis and fever.  HENT:  Positive for hearing loss. Negative for congestion.   Eyes: Negative for blurred vision.  Respiratory: Negative for cough and shortness of breath.   Cardiovascular: Negative for chest pain, palpitations and leg swelling.  Gastrointestinal: Negative for abdominal pain, blood in stool, constipation, diarrhea and melena.  Genitourinary: Negative for dysuria.  Musculoskeletal: Positive for joint pain. Negative for falls.  Skin: Negative for itching and rash.       Dry scaly  skin and scaling of scalp, but rash on scalp much improved  Neurological: Negative for dizziness and loss of consciousness.  Endo/Heme/Allergies: Does not bruise/bleed easily.  Psychiatric/Behavioral: Positive for depression and memory loss. The patient is not nervous/anxious and does not have insomnia.        Tearful     Health Maintenance  Topic Date Due  . INFLUENZA VACCINE  07/24/2018  . MAMMOGRAM  11/05/2019  . COLONOSCOPY  09/10/2021  . TETANUS/TDAP  09/01/2025  . DEXA SCAN  Completed  . Hepatitis C Screening  Completed  . PNA vac Low Risk Adult  Completed    Physical Exam: Vitals:   10/06/18 1145  BP: 120/70  Pulse: 67  Temp: 98 F (36.7 C)  TempSrc: Oral  SpO2: 97%  Weight: 172 lb (78 kg)  Height: 4' 9"  (1.448 m)   Body mass index is 37.22 kg/m. Physical Exam  Constitutional: No distress.  HENT:  Head: Normocephalic and atraumatic.  Cardiovascular: Normal rate, regular rhythm, normal heart sounds and intact distal pulses.  Pulmonary/Chest: Effort normal and breath sounds normal. No respiratory distress.  Abdominal: Bowel sounds are normal.  Musculoskeletal: Normal range of motion.  Gait unsteady, wide-based and wobbly; loss of thenar eminences; using cane  Neurological: She is alert.  Fast tremor of chin, right hand greater than left  Skin: Skin is warm and dry. Capillary refill takes less than 2 seconds. There is pallor.  Grayish skin tone has been the case long-term  Psychiatric:  Near  tears     Labs reviewed: Basic Metabolic Panel: Recent Labs    12/30/17 1613  06/23/18 0920 07/14/18 0917 09/01/18 0852 09/01/18 0957  NA 135   < > 140 138 138  --   K 4.4   < > 4.4 4.1 4.4  --   CL 99   < > 102 101 101  --   CO2 28   < > 31 29 29   --   GLUCOSE 90   < > 78 103* 87  --   BUN 14   < > 33* 12 15  --   CREATININE 0.87   < > 1.14* 1.01 0.93 0.90  CALCIUM 9.7   < > 9.8 9.4 9.5  --   TSH 0.79  --   --   --   --   --    < > = values in this interval not displayed.   Liver Function Tests: Recent Labs    01/27/18 1207 02/12/18 1340 02/20/18 0419 06/23/18 0920  AST 21 28 70* 27  ALT 19 22 51 27  ALKPHOS 85 68 70  --   BILITOT 0.5 0.8 0.8 0.5  PROT 7.7 7.4 6.4* 7.2  7.1  ALBUMIN 3.5 3.4* 3.3*  --    Recent Labs    01/07/18 1602 01/08/18 1150  LIPASE 106* 68*  AMYLASE 61  --    Recent Labs    02/20/18 0419 06/23/18 0920  AMMONIA 19 62   CBC: Recent Labs    12/30/17 1613 01/07/18 1602  02/12/18 1340 02/20/18 0419 06/23/18 0920  WBC 8.8 8.9   < > 7.9 8.8 8.1  NEUTROABS 5,544 5,936  --   --   --  4,941  HGB 12.6 12.4   < > 12.5 9.7* 12.1  HCT 37.2 36.7   < > 37.6 29.0* 36.2  MCV 90.5 90.6   < > 94.7 94.5 88.7  PLT 215 226   < > 209 169  218   < > = values in this interval not displayed.   Assessment/Plan 1. Need for influenza vaccination - Flu vaccine HIGH DOSE PF (Fluzone High dose)  2. Alcoholic cirrhosis of liver with ascites ([place]) - follows closely with GI   - Vitamin B12 level--? Absorbing properly and adequate oral supplement given not eating healthy diet b/c she does not want to cook  3. Liver mass -for MRI with GI after mass seen on [place] in July  4. Waldenstrom's macroglobulinemia ([place]) -has been stable -tinnitus may be related to this  5. Class 2 obesity due to excess calories without serious comorbidity with body mass index (BMI) of 37.0 to 37.9 in adult -is losing weight due to her poor intake and missing a meal each day  while sleeping  6. Abnormality of gait and mobility - worse recently and pt has a fear of falling; check vitamins to ensure these are not low - will order outpatient PT and  PT per her daughter's location request - Vitamin B12 - VITAMIN D 25 Hydroxy (Vit-D Deficiency, Fractures) - Ambulatory referral to Physical Therapy  7. Postoperative hypothyroidism - cont levothyroxine, but f/u tsh due to sleeping and adjusted dosage - TSH  8. Vitamin D deficiency - VITAMIN D 25 Hydroxy (Vit-D Deficiency, Fractures) -Debbie agrees to make sure [name] gets bone density when she has her mammogram at [facility]--I'm happy to place the order when we find out when she's meant to go  9.  Recurrent depression -will taper back off wellbutrin (might be worsening her tremor and does not seem to have helped her spirits or energy levels as I'd hoped) -2 wks at 161m, then stop -cont celexa -cont bereavement counseling  Labs/tests ordered:   Orders Placed This Encounter  Procedures  . Flu vaccine HIGH DOSE PF (Fluzone High dose)  . Vitamin B12  . VITAMIN D 25 Hydroxy (Vit-D Deficiency, Fractures)  . TSH  . Ambulatory referral to Physical Therapy    Referral Priority:   Routine    Referral Type:   Physical Medicine    Referral Reason:   Specialty Services Required    Requested Specialty:   Physical Therapy    Number of Visits Requested:   1    Next appt:  03/25/2019  Adley Mazurowski L. Karlo Goeden, D.O. [facility] [address] [id number] Phone (Mon-Fri 8am-5pm):  [phone] Call:  [phone]& follow prompts after 5pm & weekends Office Phone:  [phone] Fax:  [phone]

## 2018-10-06 NOTE — Patient Instructions (Addendum)
Cut wellbutrin in 1/2 for 2 weeks, then stop the wellbutrin.  I'm concerned it's making you shakier and it does not seem to have helped your energy levels.

## 2018-10-07 LAB — VITAMIN D 25 HYDROXY (VIT D DEFICIENCY, FRACTURES): Vit D, 25-Hydroxy: 48 ng/mL (ref 30–100)

## 2018-10-07 LAB — TSH: TSH: 0.72 mIU/L (ref 0.40–4.50)

## 2018-10-07 LAB — VITAMIN B12: Vitamin B-12: 1299 pg/mL — ABNORMAL HIGH (ref 200–1100)

## 2018-10-10 DIAGNOSIS — R269 Unspecified abnormalities of gait and mobility: Secondary | ICD-10-CM | POA: Diagnosis not present

## 2018-10-13 DIAGNOSIS — R269 Unspecified abnormalities of gait and mobility: Secondary | ICD-10-CM | POA: Diagnosis not present

## 2018-10-19 ENCOUNTER — Other Ambulatory Visit: Payer: Self-pay | Admitting: Internal Medicine

## 2018-10-19 ENCOUNTER — Other Ambulatory Visit: Payer: Self-pay | Admitting: Gastroenterology

## 2018-10-19 DIAGNOSIS — F339 Major depressive disorder, recurrent, unspecified: Secondary | ICD-10-CM

## 2018-10-20 DIAGNOSIS — R269 Unspecified abnormalities of gait and mobility: Secondary | ICD-10-CM | POA: Diagnosis not present

## 2018-10-29 DIAGNOSIS — R269 Unspecified abnormalities of gait and mobility: Secondary | ICD-10-CM | POA: Diagnosis not present

## 2018-11-03 DIAGNOSIS — R269 Unspecified abnormalities of gait and mobility: Secondary | ICD-10-CM | POA: Diagnosis not present

## 2018-12-10 ENCOUNTER — Encounter: Payer: Self-pay | Admitting: Internal Medicine

## 2018-12-10 DIAGNOSIS — Z853 Personal history of malignant neoplasm of breast: Secondary | ICD-10-CM | POA: Diagnosis not present

## 2018-12-10 DIAGNOSIS — Z1231 Encounter for screening mammogram for malignant neoplasm of breast: Secondary | ICD-10-CM | POA: Diagnosis not present

## 2019-01-21 ENCOUNTER — Telehealth: Payer: Self-pay | Admitting: Gastroenterology

## 2019-01-21 NOTE — Telephone Encounter (Signed)
Please review patient message-  Note: the 09/01/2018 MRI liver report stated: "follow-up imaging in 3-6 months would be a reasonable approach"  [place] frin 07/22/2018 did not mention being reevaluated:   Please advise

## 2019-01-21 NOTE — Telephone Encounter (Signed)
The patients case was previously presented at tumor board. After discussion this was felt to perhaps be LI-RADS I-II, and perhaps benign. As such, the consensus was to repeat an MRI in 6 months from her last MRI, so she is due for MRI in March. She is otherwise due for a clinic visit now if you could help coordinate. Thanks

## 2019-01-26 NOTE — Telephone Encounter (Signed)
Called and spoke with patient's daughter (Debbie-verified DPR)-Debbie informed of MD recommendations; Jackelyn Poling is agreeable with plan of care; Patient is scheduled for follow up visit on 02/17/2019 arrival at 9:15 am for 9:30 am appt; Debbie verbalized understanding of information/instructions;  Jackelyn Poling was advised to call the office at [phone] if questions/concerns arise;

## 2019-02-05 ENCOUNTER — Ambulatory Visit (INDEPENDENT_AMBULATORY_CARE_PROVIDER_SITE_OTHER): Payer: Medicare Other | Admitting: Family

## 2019-02-05 ENCOUNTER — Encounter: Payer: Self-pay | Admitting: Family

## 2019-02-05 VITALS — BP 118/74 | HR 60 | Temp 98.4°F | Resp 18 | Ht <= 58 in | Wt 172.0 lb

## 2019-02-05 DIAGNOSIS — F339 Major depressive disorder, recurrent, unspecified: Secondary | ICD-10-CM | POA: Diagnosis not present

## 2019-02-05 DIAGNOSIS — E785 Hyperlipidemia, unspecified: Secondary | ICD-10-CM | POA: Diagnosis not present

## 2019-02-05 DIAGNOSIS — Z6837 Body mass index (BMI) 37.0-37.9, adult: Secondary | ICD-10-CM

## 2019-02-05 DIAGNOSIS — G603 Idiopathic progressive neuropathy: Secondary | ICD-10-CM

## 2019-02-05 DIAGNOSIS — G8929 Other chronic pain: Secondary | ICD-10-CM

## 2019-02-05 DIAGNOSIS — M25511 Pain in right shoulder: Secondary | ICD-10-CM | POA: Diagnosis not present

## 2019-02-05 DIAGNOSIS — E890 Postprocedural hypothyroidism: Secondary | ICD-10-CM | POA: Diagnosis not present

## 2019-02-05 DIAGNOSIS — K7031 Alcoholic cirrhosis of liver with ascites: Secondary | ICD-10-CM | POA: Diagnosis not present

## 2019-02-05 MED ORDER — GABAPENTIN 100 MG PO CAPS: 100.0000 mg | ORAL_CAPSULE | Freq: Every day | ORAL | 3 refills | Status: DC

## 2019-02-05 NOTE — Patient Instructions (Addendum)
1. Take Gabapentin 100 mg capsule one by mouth daily at bedtime for right shoulder pain/ numbness and tingling of fingers.  2. Labs drawn today will call you with results.    Peripheral Neuropathy Peripheral neuropathy is a type of nerve damage. It affects nerves that carry signals between the spinal cord and the arms, legs, and the rest of the body (peripheral nerves). It does not affect nerves in the spinal cord or brain. In peripheral neuropathy, one nerve or a group of nerves may be damaged. Peripheral neuropathy is a broad category that includes many specific nerve disorders, like diabetic neuropathy, hereditary neuropathy, and carpal tunnel syndrome. What are the causes? This condition may be caused by:  Diabetes. This is the most common cause of peripheral neuropathy.  Nerve injury.  Pressure or stress on a nerve that lasts a long time.  Lack (deficiency) of B vitamins. This can result from alcoholism, poor diet, or a restricted diet.  Infections.  Autoimmune diseases, such as rheumatoid arthritis and systemic lupus erythematosus.  Nerve diseases that are passed from parent to child (inherited).  Some medicines, such as cancer medicines (chemotherapy).  Poisonous (toxic) substances, such as lead and mercury.  Too little blood flowing to the legs.  Kidney disease.  Thyroid disease. In some cases, the cause of this condition is not known. What are the signs or symptoms? Symptoms of this condition depend on which of your nerves is damaged. Common symptoms include:  Loss of feeling (numbness) in the feet, hands, or both.  Tingling in the feet, hands, or both.  Burning pain.  Very sensitive skin.  Weakness.  Not being able to move a part of the body (paralysis).  Muscle twitching.  Clumsiness or poor coordination.  Loss of balance.  Not being able to control your bladder.  Feeling dizzy.  Sexual problems. How is this diagnosed? Diagnosing and finding the  cause of peripheral neuropathy can be difficult. Your health care provider will take your medical history and do a physical exam. A neurological exam will also be done. This involves checking things that are affected by your brain, spinal cord, and nerves (nervous system). For example, your health care provider will check your reflexes, how you move, and what you can feel. You may have other tests, such as:  Blood tests.  Electromyogram (EMG) and nerve conduction tests. These tests check nerve function and how well the nerves are controlling the muscles.  Imaging tests, such as CT scans or MRI to rule out other causes of your symptoms.  Removing a small piece of nerve to be examined in a lab (nerve biopsy). This is rare.  Removing and examining a small amount of the fluid that surrounds the brain and spinal cord (lumbar puncture). This is rare. How is this treated? Treatment for this condition may involve:  Treating the underlying cause of the neuropathy, such as diabetes, kidney disease, or vitamin deficiencies.  Stopping medicines that can cause neuropathy, such as chemotherapy.  Medicine to relieve pain. Medicines may include: ? Prescription or over-the-counter pain medicine. ? Antiseizure medicine. ? Antidepressants. ? Pain-relieving patches that are applied to painful areas of skin.  Surgery to relieve pressure on a nerve or to destroy a nerve that is causing pain.  Physical therapy to help improve movement and balance.  Devices to help you move around (assistive devices). Follow these instructions at home: Medicines  Take over-the-counter and prescription medicines only as told by your health care provider. Do not take  any other medicines without first asking your health care provider.  Do not drive or use heavy machinery while taking prescription pain medicine. Lifestyle   Do not use any products that contain nicotine or tobacco, such as cigarettes and e-cigarettes.  Smoking keeps blood from reaching damaged nerves. If you need help quitting, ask your health care provider.  Avoid or limit alcohol. Too much alcohol can cause a vitamin B deficiency, and vitamin B is needed for healthy nerves.  Eat a healthy diet. This includes: ? Eating foods that are high in fiber, such as fresh fruits and vegetables, whole grains, and beans. ? Limiting foods that are high in fat and processed sugars, such as fried or sweet foods. General instructions   If you have diabetes, work closely with your health care provider to keep your blood sugar under control.  If you have numbness in your feet: ? Check every day for signs of injury or infection. Watch for redness, warmth, and swelling. ? Wear padded socks and comfortable shoes. These help protect your feet.  Develop a good support system. Living with peripheral neuropathy can be stressful. Consider talking with a mental health specialist or joining a support group.  Use assistive devices and attend physical therapy as told by your health care provider. This may include using a walker or a cane.  Keep all follow-up visits as told by your health care provider. This is important. Contact a health care provider if:  You have new signs or symptoms of peripheral neuropathy.  You are struggling emotionally from dealing with peripheral neuropathy.  Your pain is not well-controlled. Get help right away if:  You have an injury or infection that is not healing normally.  You develop new weakness in an arm or leg.  You fall frequently. Summary  Peripheral neuropathy is when the nerves in the arms, or legs are damaged, resulting in numbness, weakness, or pain.  There are many causes of peripheral neuropathy, including diabetes, pinched nerves, vitamin deficiencies, autoimmune disease, and hereditary conditions.  Diagnosing and finding the cause of peripheral neuropathy can be difficult. Your health care provider will  take your medical history, do a physical exam, and do tests, including blood tests and nerve function tests.  Treatment involves treating the underlying cause of the neuropathy and taking medicines to help control pain. Physical therapy and assistive devices may also help. This information is not intended to replace advice given to you by your health care provider. Make sure you discuss any questions you have with your health care provider. Document Released: 11/30/2002 Document Revised: 02/18/2017 Document Reviewed: 02/18/2017 Elsevier Interactive Patient Education  2019 [employer].

## 2019-02-05 NOTE — Progress Notes (Signed)
Provider: [name]   Gayland Curry, DO  Patient Care Team: Gayland Curry, DO as PCP - General (Geriatric Medicine) Armbruster, [name], MD as Consulting Physician (Gastroenterology) [name], MD as Consulting Physician (General Surgery)  Extended Emergency Contact Information Primary Emergency Contact: [name],Christina Address: [address]          [place], [place] [address] [name] of [place] Phone: [phone] Mobile Phone: [phone] Relation: Daughter Secondary Emergency Contact: [name],Christina Address: [address]          [place], [place] [address] [name] of [place] Phone: [phone] Relation: Son   Goals of care: Advanced Directive information Advanced Directives 06/23/2018  Does Patient Have a Medical Advance Directive? Yes  Type of Advance Directive Out of facility DNR (pink MOST or yellow form)  Does patient want to make changes to medical advance directive? No - Patient declined  Copy of [place] in Chart? -  Pre-existing out of facility DNR order (yellow form or pink MOST form) Yellow form placed in chart (order not valid for inpatient use);Pink MOST form placed in chart (order not valid for inpatient use)     Chief Complaint  Patient presents with  . Medical Management of Chronic Issues    4 month follow up , patient states she is just experiencing pain from arthritis     HPI:  Pt is a 73 y.o. female seen today for medical management of chronic diseases.she is here today with her son.she has had no recent hospital admission.No weight changes.she states walks with a cane and a walker if going long distance.No or fall episode.   Hypothyroidism-on levothyroxine 88 mcg tablet daily.   Depression/anxiety  - states husband passed away last year sometimes gets anxious and depressed but symptoms have improved.she no longer sees a hospice counselor states completed her time. She thinks she is doing well on  Celexa 40 mg tablet daily.     Numbness and tingling on fingers -worsening on both hands.thought right hands numbness and tingling was due to pinched nerve on the right shoulder but now has symptoms on both hands.symptoms waking her up at night.    Right shoulder chronic pain - pain due to arthritis.unable to raise arm.plans for surgery with Orthopedic.states taking tylenol once in a while but tries not to take many tylenol due to her liver cirrhosis.   Alcoholic liver cirrhosis with ascites - No worsening edema.on furosemide 20 mg tablet twice daily,Aldactone 50 mg tablet daily and Lactulose 15 gm daily.she states has 2-3 bowel movement daily but will stay in the bathroom more if she takes extra lactulose.     Past Medical History:  Diagnosis Date  . Abnormal EKG   . Abnormal finding on thyroid function test   . Abnormal liver function test   . Acute edema   . Alcoholic cirrhosis ([place]za-[place] II) 78/67/6720   Possible NASH overlap (MELD 13)  . Anemia   . Anxiety   . Arthritis   . Ascites   . Back pain   . Breast mass   . Chronic low back pain   . Complete right rotator cuff tear   . Complication of anesthesia    during first surgery- woke up during surgery many years ago  . Compression fracture of L4 lumbar vertebra   . Dermatitis, eczematoid   . Difficulty breathing   . Gallstones   . History of adenocarcinoma of breast   . Hypercholesterolemia   . Hyperkalemia   .  Hypertension   . Hypokalemia   . Hypothyroidism   . Insomnia   . Knee pain   . Leukocytosis   . Lymphadenopathy   . Macrocytosis   . Memory loss or impairment   . Menopause   . MGUS (monoclonal gammopathy of unknown significance)   . Osteoarthritis   . Osteoarthritis of right knee   . Other cirrhosis of liver ([place])   . Renal insufficiency syndrome   . Right knee DJD   . Solitary thyroid nodule   . Unspecified lump in the left breast, unspecified quadrant   . Vitamin B 12 deficiency   . Waldenstrom  macroglobulinemia ([place])    Past Surgical History:  Procedure Laterality Date  . BREAST SURGERY  2014   Removed lymphnodes  . EYE SURGERY  2006   bilateral cataract  . JOINT REPLACEMENT     right  (2017)and left knee  . LAPAROSCOPIC CHOLECYSTECTOMY SINGLE SITE WITH INTRAOPERATIVE CHOLANGIOGRAM N/A 02/19/2018   Procedure: LAPAROSCOPIC CHOLECYSTECTOMY;  Surgeon: [name], MD;  Location: [facility];  Service: General;  Laterality: N/A;  . LESION REMOVAL  09/2015   tubular adenoma-4 subcentimeter lesions  . LIVER BIOPSY  02/19/2018   Procedure: LIVER BIOPSY;  Surgeon: [name], MD;  Location: [facility];  Service: General;;  . right thumb surgery    . TONSILLECTOMY    . TOTAL KNEE ARTHROPLASTY Right 09/05/2016  . UMBILICAL HERNIA REPAIR  02/19/2018   Procedure: REPAIR OF INCARCERATED UMBILICAL HERNIA;  Surgeon: [name], MD;  Location: [facility];  Service: General;;    No Known Allergies  Allergies as of 02/05/2019   No Known Allergies     Medication List       Accurate as of February 05, 2019 11:34 AM. Always use your most recent med list.        citalopram 40 MG tablet Commonly known as:  CELEXA TAKE ONE TABLET BY MOUTH DAILY   furosemide 20 MG tablet Commonly known as:  LASIX Take 1 tablet (20 mg total) by mouth every morning.   ketoconazole 2 % shampoo Commonly known as:  NIZORAL Apply 1 application topically 2 (two) times a week.   lactulose 10 GM/15ML solution Commonly known as:  CHRONULAC Take 15 g by mouth daily.   levothyroxine 88 MCG tablet Commonly known as:  SYNTHROID, LEVOTHROID TAKE 1 TABLET BY MOUTH ONCE A  DAY 30 MINUTES BEFORE BREAKFAST AND BEFORE ANY OTHER MEDICATIONS   multivitamin tablet Take 1 tablet by mouth daily.   propranolol 20 MG tablet Commonly known as:  INDERAL TAKE ONE TABLET BY MOUTH EVERY MORNING   spironolactone 50 MG tablet Commonly known as:  ALDACTONE Take 1 tablet (50 mg total) by mouth daily. Pharmacy-please d/c rx for  spironolactone 100 mg dosage   vitamin B-12 1000 MCG tablet Commonly known as:  CYANOCOBALAMIN Take 1,000 mcg by mouth every morning.   Vitamin D3 50 MCG (2000 UT) Tabs Take 1 tablet by mouth every morning.       Review of Systems  Constitutional: Positive for fatigue. Negative for chills, fever and unexpected weight change.  HENT: Positive for tinnitus. Negative for congestion, ear discharge, ear pain, hearing loss, rhinorrhea, sinus pressure, sinus pain, sneezing, sore throat and trouble swallowing.   Eyes: Negative for pain, discharge, redness, itching and visual disturbance.  Respiratory: Negative for cough, chest tightness, shortness of breath and wheezing.   Cardiovascular: Negative for chest pain, palpitations and leg swelling.  Gastrointestinal: Negative for abdominal distention,  abdominal pain, constipation, diarrhea, nausea and vomiting.       2-3 Bowel movement with lactulose   Endocrine: Negative for cold intolerance, heat intolerance, polydipsia, polyphagia and polyuria.  Genitourinary: Negative for dysuria, flank pain, frequency and urgency.  Musculoskeletal: Positive for arthralgias and gait problem.       Uses a cane and walker.   Skin: Negative for color change, pallor and rash.       Itching on the head has improved with shampoo.   Neurological: Negative for dizziness, syncope, weakness and light-headedness.       Chronic headache takes tylenol occasion.Numbness and tingling on fingers worsening.   Hematological: Bruises/bleeds easily.  Psychiatric/Behavioral: Positive for sleep disturbance. Negative for agitation.       Anxiety and depression has improved     Immunization History  Administered Date(s) Administered  . Hep A / Hep B 01/13/2018, 02/12/2018, 07/14/2018  . Influenza Inj Mdck Quad Pf 10/12/2013  . Influenza, High Dose Seasonal PF 09/09/2017, 10/06/2018  . Influenza-Unspecified 09/10/2007, 09/22/2008, 10/15/2012, 09/29/2014, 08/24/2016  .  Pneumococcal Conjugate-13 12/30/2017  . Pneumococcal Polysaccharide-23 06/25/2013  . Tdap 09/02/2015  . Zoster 12/28/2013  . Zoster Recombinat (Shingrix) 06/25/2017, 10/03/2017   Pertinent  Health Maintenance Due  Topic Date Due  . MAMMOGRAM  12/10/2020  . COLONOSCOPY  09/10/2021  . INFLUENZA VACCINE  Completed  . DEXA SCAN  Completed  . PNA vac Low Risk Adult  Completed   Fall Risk  02/05/2019 10/06/2018 06/23/2018 03/19/2018 01/22/2018  Falls in the past year? 0 No No No No  Number falls in past yr: 0 - - - -  Injury with Fall? 0 - - - -    Vitals:   02/05/19 1118  BP: 118/74  Pulse: 60  Temp: 98.4 F (36.9 C)  TempSrc: Oral  SpO2: 98%  Weight: 172 lb (78 kg)  Height: 4' 9"  (1.448 m)   Body mass index is 37.22 kg/m. Physical Exam Vitals signs reviewed.  Constitutional:      General: She is not in acute distress.    Appearance: She is obese.  HENT:     Head: Normocephalic.     Right Ear: Tympanic membrane, ear canal and external ear normal. There is no impacted cerumen.     Left Ear: Tympanic membrane, ear canal and external ear normal. There is no impacted cerumen.     Nose: Nose normal. No congestion or rhinorrhea.     Mouth/Throat:     Mouth: Mucous membranes are moist.     Pharynx: Oropharynx is clear. No oropharyngeal exudate or posterior oropharyngeal erythema.  Eyes:     General: No scleral icterus.       Right eye: No discharge.        Left eye: No discharge.     Extraocular Movements: Extraocular movements intact.     Conjunctiva/sclera: Conjunctivae normal.     Pupils: Pupils are equal, round, and reactive to light.     Comments: Corrective lens in place   Neck:     Musculoskeletal: Normal range of motion. No neck rigidity or muscular tenderness.     Vascular: No carotid bruit.  Cardiovascular:     Rate and Rhythm: Normal rate and regular rhythm.     Pulses: Normal pulses.     Heart sounds: Normal heart sounds. No murmur. No friction rub. No gallop.     Pulmonary:     Effort: Pulmonary effort is normal. No respiratory distress.  Breath sounds: Normal breath sounds. No wheezing, rhonchi or rales.  Chest:     Chest wall: No tenderness.  Abdominal:     General: Bowel sounds are normal. There is no distension.     Palpations: Abdomen is soft. There is no mass.     Tenderness: There is no abdominal tenderness. There is no right CVA tenderness, left CVA tenderness, guarding or rebound.  Musculoskeletal: Normal range of motion.        General: No swelling or tenderness.     Right lower leg: No edema.     Left lower leg: No edema.  Lymphadenopathy:     Cervical: No cervical adenopathy.  Skin:    General: Skin is warm and dry.     Coloration: Skin is not pale.     Findings: No erythema, lesion or rash.  Neurological:     Mental Status: She is alert and oriented to person, place, and time.     Cranial Nerves: No cranial nerve deficit.     Sensory: No sensory deficit.     Motor: No weakness.     Coordination: Coordination normal.     Gait: Gait abnormal.     Comments: Ambulates with a right hand cane and also uses a walker.   Psychiatric:        Mood and Affect: Mood normal.        Speech: Speech normal.        Behavior: Behavior normal.        Thought Content: Thought content normal.        Judgment: Judgment normal.    Labs reviewed: Recent Labs    06/23/18 0920 07/14/18 0917 09/01/18 0852 09/01/18 0957  NA 140 138 138  --   K 4.4 4.1 4.4  --   CL 102 101 101  --   CO2 31 29 29   --   GLUCOSE 78 103* 87  --   BUN 33* 12 15  --   CREATININE 1.14* 1.01 0.93 0.90  CALCIUM 9.8 9.4 9.5  --    Recent Labs    02/12/18 1340 02/20/18 0419 06/23/18 0920  AST 28 70* 27  ALT 22 51 27  ALKPHOS 68 70  --   BILITOT 0.8 0.8 0.5  PROT 7.4 6.4* 7.2  7.1  ALBUMIN 3.4* 3.3*  --    Recent Labs    02/12/18 1340 02/20/18 0419 06/23/18 0920  WBC 7.9 8.8 8.1  NEUTROABS  --   --  4,941  HGB 12.5 9.7* 12.1  HCT 37.6 29.0*  36.2  MCV 94.7 94.5 88.7  PLT 209 169 218   Lab Results  Component Value Date   TSH 0.72 10/06/2018   No results found for: HGBA1C Lab Results  Component Value Date   CHOL 190 05/17/2017   HDL 74 05/17/2017   LDLCALC 100 (H) 05/17/2017   TRIG 81 05/17/2017   CHOLHDL 2.6 05/17/2017    Significant Diagnostic Results in last 30 days:  No results found.  Assessment/Plan 1. Idiopathic progressive neuropathy Worsening on both hands. - start on Gabapentin 100 mg capsule one by mouth at bedtime.If symptoms still not improved may consider taking adjusting Gabapentin to twice daily.   2. Postoperative hypothyroidism Continue on levothyroxine 88 mcg tablet daily.   - TSH  3. Hyperlipidemia, unspecified hyperlipidemia type Lab Results  Component Value Date   CHOL 190 05/17/2017   HDL 74 05/17/2017   LDLCALC 100 (H) 05/17/2017   TRIG 81  05/17/2017   CHOLHDL 2.6 05/17/2017   Current not on medication. Low carbo,low saturated fats and high vegetable diet recommended. - Lipid panel  4. Depression, recurrent ([place]) Stable.continue on Celexa 40 mg tablet daily. Continue to monitor for mood changes.   5. Body mass index (BMI) of 37.0-37.9 in adult BMI 37.22 Low carbo,low saturated fats and high vegetable diet recommended. - CMP with eGFR([facility]) - lipid panel  - TSH  6. Alcoholic cirrhosis of liver with ascites ([facility]) No longer drinking alcohol.Negative exam findings.  - CBC with Differential/Platelet - CMP with eGFR([facility])  7. Chronic right shoulder pain Limited ROM.follows up with Orthopedic plans for surgery per son. Add Gabapentin 100 mg capsule one by mouth at bedtime for pain.   Family/ staff Communication: Reviewed plan of care with patient and son  Labs/tests ordered:  - CBC with Differential/Platelet - CMP with eGFR([facility]) - lipid panel  - TSH  Christina [name] C Divonte Senger, NP

## 2019-02-06 ENCOUNTER — Other Ambulatory Visit: Payer: Self-pay

## 2019-02-06 DIAGNOSIS — R7989 Other specified abnormal findings of blood chemistry: Secondary | ICD-10-CM

## 2019-02-06 DIAGNOSIS — E785 Hyperlipidemia, unspecified: Secondary | ICD-10-CM

## 2019-02-06 DIAGNOSIS — K7031 Alcoholic cirrhosis of liver with ascites: Secondary | ICD-10-CM

## 2019-02-06 LAB — COMPLETE METABOLIC PANEL WITH GFR
AG Ratio: 1 (calc) (ref 1.0–2.5)
ALKALINE PHOSPHATASE (APISO): 71 U/L (ref 37–153)
ALT: 14 U/L (ref 6–29)
AST: 21 U/L (ref 10–35)
Albumin: 3.6 g/dL (ref 3.6–5.1)
BILIRUBIN TOTAL: 0.7 mg/dL (ref 0.2–1.2)
BUN: 12 mg/dL (ref 7–25)
CHLORIDE: 99 mmol/L (ref 98–110)
CO2: 30 mmol/L (ref 20–32)
CREATININE: 0.79 mg/dL (ref 0.60–0.93)
Calcium: 9.6 mg/dL (ref 8.6–10.4)
GFR, Est African American: 85 mL/min/{1.73_m2} (ref >=60)
GFR, Est Non African American: 74 mL/min/{1.73_m2} (ref >=60)
GLUCOSE: 72 mg/dL (ref 65–99)
Globulin: 3.6 g/dL (calc) (ref 1.9–3.7)
Potassium: 4.2 mmol/L (ref 3.5–5.3)
SODIUM: 138 mmol/L (ref 135–146)
Total Protein: 7.2 g/dL (ref 6.1–8.1)

## 2019-02-06 LAB — LIPID PANEL
CHOLESTEROL: 181 mg/dL (ref <200)
HDL: 64 mg/dL (ref >=50)
LDL CHOLESTEROL (CALC): 100 mg/dL — AB
Non-HDL Cholesterol (Calc): 117 mg/dL (calc) (ref <130)
TRIGLYCERIDES: 78 mg/dL (ref <150)
Total CHOL/HDL Ratio: 2.8 (calc) (ref <5.0)

## 2019-02-06 LAB — CBC WITH DIFFERENTIAL/PLATELET
Absolute Monocytes: 960 cells/uL — ABNORMAL HIGH (ref 200–950)
BASOS PCT: 0.5 %
Basophils Absolute: 50 cells/uL (ref 0–200)
Eosinophils Absolute: 110 cells/uL (ref 15–500)
Eosinophils Relative: 1.1 %
HCT: 35.3 % (ref 35.0–45.0)
HEMOGLOBIN: 12 g/dL (ref 11.7–15.5)
Lymphs Abs: 1710 cells/uL (ref 850–3900)
MCH: 30.3 pg (ref 27.0–33.0)
MCHC: 34 g/dL (ref 32.0–36.0)
MCV: 89.1 fL (ref 80.0–100.0)
MONOS PCT: 9.6 %
MPV: 10.9 fL (ref 7.5–12.5)
NEUTROS ABS: 7170 {cells}/uL (ref 1500–7800)
Neutrophils Relative %: 71.7 %
Platelets: 226 10*3/uL (ref 140–400)
RBC: 3.96 10*6/uL (ref 3.80–5.10)
RDW: 11.7 % (ref 11.0–15.0)
Total Lymphocyte: 17.1 %
WBC: 10 10*3/uL (ref 3.8–10.8)

## 2019-02-06 LAB — TSH: TSH: 0.36 m[IU]/L — AB (ref 0.40–4.50)

## 2019-02-06 MED ORDER — LEVOTHYROXINE SODIUM 75 MCG PO TABS: 75.0000 ug | ORAL_TABLET | Freq: Every day | ORAL | 1 refills | Status: DC

## 2019-02-17 ENCOUNTER — Other Ambulatory Visit (INDEPENDENT_AMBULATORY_CARE_PROVIDER_SITE_OTHER): Payer: Medicare Other

## 2019-02-17 ENCOUNTER — Ambulatory Visit: Payer: Medicare Other | Admitting: Gastroenterology

## 2019-02-17 ENCOUNTER — Encounter: Payer: Self-pay | Admitting: Gastroenterology

## 2019-02-17 ENCOUNTER — Ambulatory Visit (INDEPENDENT_AMBULATORY_CARE_PROVIDER_SITE_OTHER): Payer: Medicare Other | Admitting: Gastroenterology

## 2019-02-17 VITALS — BP 108/60 | HR 65 | Ht <= 58 in | Wt 170.0 lb

## 2019-02-17 DIAGNOSIS — R188 Other ascites: Secondary | ICD-10-CM

## 2019-02-17 DIAGNOSIS — R932 Abnormal findings on diagnostic imaging of liver and biliary tract: Secondary | ICD-10-CM | POA: Diagnosis not present

## 2019-02-17 DIAGNOSIS — K7682 Hepatic encephalopathy: Secondary | ICD-10-CM

## 2019-02-17 DIAGNOSIS — I8500 Esophageal varices without bleeding: Secondary | ICD-10-CM

## 2019-02-17 DIAGNOSIS — K7460 Unspecified cirrhosis of liver: Secondary | ICD-10-CM

## 2019-02-17 DIAGNOSIS — K7290 Hepatic failure, unspecified without coma: Secondary | ICD-10-CM

## 2019-02-17 LAB — PROTIME-INR
INR: 1.1 ratio — AB (ref 0.8–1.0)
PROTHROMBIN TIME: 13.1 s (ref 9.6–13.1)

## 2019-02-17 NOTE — Patient Instructions (Signed)
If you are age 75 or older, your body mass index should be between 23-30. Your Body mass index is 36.79 kg/m. If this is out of the aforementioned range listed, please consider follow up with your Primary Care Provider.  If you are age 44 or younger, your body mass index should be between 19-25. Your Body mass index is 36.79 kg/m. If this is out of the aformentioned range listed, please consider follow up with your Primary Care Provider.   Please go to the lab in the basement of our building to have lab work done as you leave today. Hit "B" for basement when you get on the elevator.  When the doors open the lab is on your left.  We will call you with the results. Thank you.   You have been scheduled for an MRI of the Liver at [facility], located at [address] in the [facility]. Your appointment is scheduled on Tuesday, 02-24-19 at 9:00am. Please arrive 30 minutes prior to your appointment time for registration purposes. Please make certain not to have anything to eat or drink after midnight prior to your test. In addition, if you have any metal in your body, have a pacemaker or defibrillator, please be sure to let your ordering physician know. This test typically takes 45 minutes to 1 hour to complete. Should you need to reschedule, please call [phone].  Thank you for entrusting me with your care and for choosing [facility], Dr. Taylor Landing Cellar

## 2019-02-17 NOTE — Progress Notes (Signed)
HPI :  73 y/o female here for follow up for cirrhosis. 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 She has a history of ascites, small varices, and hepatic encephalopathy. She's had a prior liver biopsy done during time of cholecystectomy by Dr. Johney Maine. Her liver biopsy showed evidence of cirrhosis, some areas of "chronic inflammatory infiltrates with focal proliferating bile ducts", as well as Mallory bodies. She's had negative serologic workup for immune hepatitis, AMA negative.   She was here with her son today for follow up. She continues to do well on her present regimen. Due to renal function her aldactone was decreased to 88m / day and lasix at 250m/ day at our last visit. She denies edema or ascites. She is taking the lactulose and encephalopathy controlled. She is tolerating propranolol once daily, HR in 60s.   Major current since her last visit was abnormal liver imaging. She had an ultrasound done on 07/22/2018 which showed a questionable mass in the right lobe. She subsequently had an MRI of the liver on September 25. There was a 2.6 cm enhancing lesion in the lateral aspect of segment 7. Differential includes confluent hepatic fibrosis versus HCC. AFP was normal. Her case was presented at tumor board at the oncology center. Following discussion was recommended that she have follow-up imaging in 6 months for reassessment rather than proceed with biopsy. She is due for this next month.  She is otherwise feeling really well without complaints. Colonoscopy done since our last visit as below.   Prior endoscopic workup Colonoscopy - 10/13/2015 - Dr. [facility]. - exam was a poor prep which was aborted in the left colon, told to repeat with 2 day prep.  Colonoscopy - 10/20/2015 - Dr. IyAdolph Pollack 3 ascending polyps 5-2m14mn size, one small sigmoid polyp removed, diverticulosis - fair bowel prep - recommended 3 year follow  up Colonoscopy 09/10/2018 - Two sessile polyps were found in the cecum. The polyps were 4 to 8 mm in size. These polyps were removed with a cold snare. Resection and retrieval were complete. - Two sessile polyps were found in the ascending colon. The polyps were 3 to 5 mm in size. These polyps were removed with a cold snare. Resection and retrieval were complete. - Four sessile polyps were found in the transverse colon. The polyps were 3 to 4 mm in size. These polyps were removed with a cold snare. Resection and retrieval were complete. - Many small and large-mouthed diverticula were found in the entire colon. - Internal hemorrhoids were found during retroflexion. Repeat 3 years  2016 - EGD - small esophageal varices, mild PHG  MRI liver 09/17/2018 - 2.6 cm enhancing lesion in the lateral aspect of segment 7, as described above, LI-RADS 4 (probable HCC). Discussion at multidisciplinary tumor board is suggested, with considerations including short-term follow-up in 3-6 months versus biopsy as clinically warranted. Discussed at tumor board. Given the wedge-shaped appearance of the lesion on coronal imaging, confluent hepatic fibrosis is within the differential. As such, particularly if the AFP is not elevated, follow-up imaging in 3-6 months would be a reasonable approach.    Past Medical History:  Diagnosis Date  . Abnormal EKG   . Abnormal finding on thyroid function test   . Abnormal liver function test   . Acute edema   . Alcoholic cirrhosis ([place]7/54/62/7035Possible NASH overlap (MELD 13)  . Anemia   .  Anxiety   . Arthritis   . Ascites   . Back pain   . Breast mass   . Chronic low back pain   . Complete right rotator cuff tear   . Complication of anesthesia    during first surgery- woke up during surgery many years ago  . Compression fracture of L4 lumbar vertebra   . Dermatitis, eczematoid   . Difficulty breathing   . Gallstones   . History of adenocarcinoma of breast   .  Hypercholesterolemia   . Hyperkalemia   . Hypertension   . Hypokalemia   . Hypothyroidism   . Insomnia   . Knee pain   . Leukocytosis   . Lymphadenopathy   . Macrocytosis   . Memory loss or impairment   . Menopause   . MGUS (monoclonal gammopathy of unknown significance)   . Osteoarthritis   . Osteoarthritis of right knee   . Other cirrhosis of liver ([place])   . Renal insufficiency syndrome   . Right knee DJD   . Solitary thyroid nodule   . Unspecified lump in the left breast, unspecified quadrant   . Vitamin B 12 deficiency   . 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[place])      Past Surgical History:  Procedure Laterality Date  . BREAST SURGERY  2014   Removed lymphnodes  . EYE SURGERY  2006   bilateral cataract  . JOINT REPLACEMENT     right  (2017)and left knee  . LAPAROSCOPIC CHOLECYSTECTOMY SINGLE SITE WITH INTRAOPERATIVE CHOLANGIOGRAM N/A 02/19/2018   Procedure: LAPAROSCOPIC CHOLECYSTECTOMY;  Surgeon: [name], MD;  Location: [facility];  Service: General;  Laterality: N/A;  . LESION REMOVAL  09/2015   tubular adenoma-4 subcentimeter lesions  . LIVER BIOPSY  02/19/2018   Procedure: LIVER BIOPSY;  Surgeon: [name], MD;  Location: [facility];  Service: General;;  . right thumb surgery    . TONSILLECTOMY    . TOTAL KNEE ARTHROPLASTY Right 09/05/2016  . UMBILICAL HERNIA REPAIR  02/19/2018   Procedure: REPAIR OF INCARCERATED UMBILICAL HERNIA;  Surgeon: [name], MD;  Location: [facility];  Service: General;;   Family History  Problem Relation Age of Onset  . Breast cancer Mother   . Colon cancer Neg Hx   . Stomach cancer Neg Hx   . Rectal cancer Neg Hx   . Liver cancer Neg Hx   . Esophageal cancer Neg Hx    Social History   Tobacco Use  . Smoking status: Never Smoker  . Smokeless tobacco: Never Used  Substance Use Topics  . Alcohol use: No    Comment: 6 or more per day in the past.   . Drug use: No   Current Outpatient Medications  Medication Sig Dispense Refill   . Cholecalciferol (VITAMIN D3) 2000 units TABS Take 1 tablet by mouth every morning.    . citalopram (CELEXA) 40 MG tablet TAKE ONE TABLET BY MOUTH DAILY 90 tablet 1  . furosemide (LASIX) 20 MG tablet Take 1 tablet (20 mg total) by mouth every morning. 90 tablet 3  . gabapentin (NEURONTIN) 100 MG capsule Take 1 capsule (100 mg total) by mouth at bedtime. 90 capsule 3  . ketoconazole (NIZORAL) 2 % shampoo Apply 1 application topically 2 (two) times a week. 120 mL 3  . lactulose (CHRONULAC) 10 GM/15ML solution Take 15 g by mouth daily.    Marland Kitchen levothyroxine (SYNTHROID, LEVOTHROID) 75 MCG tablet Take 1 tablet (75 mcg total) by mouth daily before breakfast. 90 tablet  1  . Multiple Vitamin (MULTIVITAMIN) tablet Take 1 tablet by mouth daily.    . propranolol (INDERAL) 20 MG tablet TAKE ONE TABLET BY MOUTH EVERY MORNING 90 tablet 2  . spironolactone (ALDACTONE) 50 MG tablet Take 1 tablet (50 mg total) by mouth daily. Pharmacy-please d/c rx for spironolactone 100 mg dosage 30 tablet 1  . vitamin B-12 (CYANOCOBALAMIN) 1000 MCG tablet Take 1,000 mcg by mouth every morning.     No current facility-administered medications for this visit.    No Known Allergies   Review of Systems: All systems reviewed and negative except where noted in HPI.   Lab Results  Component Value Date   CREATININE 0.79 02/05/2019   BUN 12 02/05/2019   NA 138 02/05/2019   K 4.2 02/05/2019   CL 99 02/05/2019   CO2 30 02/05/2019    Lab Results  Component Value Date   ALT 14 02/05/2019   AST 21 02/05/2019   ALKPHOS 70 02/20/2018   BILITOT 0.7 02/05/2019   Lab Results  Component Value Date   WBC 10.0 02/05/2019   HGB 12.0 02/05/2019   HCT 35.3 02/05/2019   MCV 89.1 02/05/2019   PLT 226 02/05/2019    Lab Results  Component Value Date   INR 1.1 (H) 02/17/2019   INR 1.2 (H) 07/14/2018   INR 1.21 01/08/2018     Physical Exam: BP 108/60   Pulse 65   Ht 4' 9"  (1.448 m)   Wt 170 lb (77.1 kg)   BMI 36.79  kg/m  Constitutional: Pleasant,well-developed, female in no acute distress. HEENT: Normocephalic and atraumatic. Conjunctivae are normal. No scleral icterus. Neck supple.  Cardiovascular: Normal rate, regular rhythm.  Pulmonary/chest: Effort normal and breath sounds normal. No wheezing, rales or rhonchi. Abdominal: Soft, nondistended, nontender.  There are no masses palpable.  Extremities: no edema Lymphadenopathy: No cervical adenopathy noted. Neurological: Alert and oriented to person place and time. No asterixis Skin: Skin is warm and dry. No rashes noted. Psychiatric: Normal mood and affect. Behavior is normal.   ASSESSMENT AND PLAN: 73 year old female here for reassessment of the following:  Cirrhosis with ascites / hepatic encephalopathy / esophageal varices / abnormal liver imaging - generally she's been doing really well since of last seen her. Diuretics are working to keep ascites and edema at day. She is tolerating propranolol at a low-dose due to low resting heart rate. She is tolerating her lactulose and encephalopathy is controlled. Her labs are stable. Most pressing issue is the abnormal liver MRI. Based off review at multidisciplinary conference, they had recommended interval 6 month exam to further evaluate this lesion. She is due for the MRI next month, will schedule that. I will otherwise check her INR and AFP today. Her labs otherwise appears stable. We'll let her know results of her MRI with further recommendations. Otherwise continue regimen for now. She agreed  [place] Cellar, MD [facility]

## 2019-02-18 LAB — AFP TUMOR MARKER: AFP-Tumor Marker: 2.6 ng/mL

## 2019-02-24 ENCOUNTER — Ambulatory Visit (HOSPITAL_COMMUNITY): Payer: Medicare Other

## 2019-03-03 ENCOUNTER — Ambulatory Visit (HOSPITAL_COMMUNITY)
Admission: RE | Admit: 2019-03-03 | Discharge: 2019-03-03 | Disposition: A | Discharge status: Home / Self Care | Payer: Medicare Other | Source: Ambulatory Visit | Attending: Gastroenterology | Admitting: Gastroenterology

## 2019-03-03 DIAGNOSIS — R188 Other ascites: Secondary | ICD-10-CM | POA: Diagnosis not present

## 2019-03-03 DIAGNOSIS — K7460 Unspecified cirrhosis of liver: Secondary | ICD-10-CM | POA: Diagnosis not present

## 2019-03-03 DIAGNOSIS — K740 Hepatic fibrosis: Secondary | ICD-10-CM | POA: Diagnosis not present

## 2019-03-03 DIAGNOSIS — R932 Abnormal findings on diagnostic imaging of liver and biliary tract: Secondary | ICD-10-CM | POA: Insufficient documentation

## 2019-03-03 DIAGNOSIS — K7290 Hepatic failure, unspecified without coma: Secondary | ICD-10-CM | POA: Diagnosis not present

## 2019-03-03 DIAGNOSIS — I8500 Esophageal varices without bleeding: Secondary | ICD-10-CM | POA: Diagnosis not present

## 2019-03-03 DIAGNOSIS — K7682 Hepatic encephalopathy: Secondary | ICD-10-CM

## 2019-03-03 MED ORDER — GADOBUTROL 1 MMOL/ML IV SOLN
9.0000 mL | Freq: Once | INTRAVENOUS | Status: AC | PRN
  Administered 2019-03-03: 7 mL via INTRAVENOUS

## 2019-03-23 ENCOUNTER — Ambulatory Visit: Payer: Self-pay

## 2019-03-25 ENCOUNTER — Encounter: Payer: Self-pay | Admitting: Family

## 2019-03-25 ENCOUNTER — Ambulatory Visit: Payer: Self-pay

## 2019-04-01 ENCOUNTER — Other Ambulatory Visit: Payer: Self-pay

## 2019-04-01 DIAGNOSIS — K7031 Alcoholic cirrhosis of liver with ascites: Secondary | ICD-10-CM

## 2019-04-01 DIAGNOSIS — E785 Hyperlipidemia, unspecified: Secondary | ICD-10-CM

## 2019-04-01 DIAGNOSIS — Z6837 Body mass index (BMI) 37.0-37.9, adult: Secondary | ICD-10-CM

## 2019-04-01 DIAGNOSIS — R7989 Other specified abnormal findings of blood chemistry: Secondary | ICD-10-CM

## 2019-04-02 ENCOUNTER — Other Ambulatory Visit: Payer: Medicare Other

## 2019-04-02 ENCOUNTER — Other Ambulatory Visit: Payer: Self-pay

## 2019-04-02 DIAGNOSIS — K7031 Alcoholic cirrhosis of liver with ascites: Secondary | ICD-10-CM | POA: Diagnosis not present

## 2019-04-02 DIAGNOSIS — E785 Hyperlipidemia, unspecified: Secondary | ICD-10-CM | POA: Diagnosis not present

## 2019-04-02 DIAGNOSIS — R7989 Other specified abnormal findings of blood chemistry: Secondary | ICD-10-CM | POA: Diagnosis not present

## 2019-04-02 LAB — TSH: TSH: 2.02 mIU/L (ref 0.40–4.50)

## 2019-04-03 ENCOUNTER — Other Ambulatory Visit: Payer: Medicare Other

## 2019-04-29 ENCOUNTER — Other Ambulatory Visit: Payer: Self-pay | Admitting: Internal Medicine

## 2019-04-29 DIAGNOSIS — F339 Major depressive disorder, recurrent, unspecified: Secondary | ICD-10-CM

## 2019-04-29 DIAGNOSIS — I8510 Secondary esophageal varices without bleeding: Secondary | ICD-10-CM

## 2019-04-29 DIAGNOSIS — K7030 Alcoholic cirrhosis of liver without ascites: Principal | ICD-10-CM

## 2019-04-30 ENCOUNTER — Other Ambulatory Visit: Payer: Self-pay

## 2019-04-30 ENCOUNTER — Other Ambulatory Visit: Payer: Medicare Other

## 2019-04-30 DIAGNOSIS — K7031 Alcoholic cirrhosis of liver with ascites: Secondary | ICD-10-CM | POA: Diagnosis not present

## 2019-04-30 DIAGNOSIS — E785 Hyperlipidemia, unspecified: Secondary | ICD-10-CM | POA: Diagnosis not present

## 2019-04-30 LAB — COMPLETE METABOLIC PANEL WITH GFR
AG Ratio: 1.1 (calc) (ref 1.0–2.5)
ALT: 17 U/L (ref 6–29)
AST: 24 U/L (ref 10–35)
Albumin: 3.6 g/dL (ref 3.6–5.1)
Alkaline phosphatase (APISO): 64 U/L (ref 37–153)
BUN: 14 mg/dL (ref 7–25)
CO2: 31 mmol/L (ref 20–32)
Calcium: 9.2 mg/dL (ref 8.6–10.4)
Chloride: 103 mmol/L (ref 98–110)
Creat: 0.83 mg/dL (ref 0.60–0.93)
GFR, Est African American: 81 mL/min/{1.73_m2} (ref >=60)
GFR, Est Non African American: 69 mL/min/{1.73_m2} (ref >=60)
Globulin: 3.4 g/dL (calc) (ref 1.9–3.7)
Glucose, Bld: 88 mg/dL (ref 65–99)
Potassium: 4.2 mmol/L (ref 3.5–5.3)
Sodium: 140 mmol/L (ref 135–146)
Total Bilirubin: 0.5 mg/dL (ref 0.2–1.2)
Total Protein: 7 g/dL (ref 6.1–8.1)

## 2019-04-30 LAB — CBC WITH DIFFERENTIAL/PLATELET
Absolute Monocytes: 755 cells/uL (ref 200–950)
Basophils Absolute: 48 cells/uL (ref 0–200)
Basophils Relative: 0.7 %
Eosinophils Absolute: 88 cells/uL (ref 15–500)
Eosinophils Relative: 1.3 %
HCT: 36.3 % (ref 35.0–45.0)
Hemoglobin: 12.1 g/dL (ref 11.7–15.5)
Lymphs Abs: 1741 cells/uL (ref 850–3900)
MCH: 30.5 pg (ref 27.0–33.0)
MCHC: 33.3 g/dL (ref 32.0–36.0)
MCV: 91.4 fL (ref 80.0–100.0)
MPV: 10.7 fL (ref 7.5–12.5)
Monocytes Relative: 11.1 %
Neutro Abs: 4168 cells/uL (ref 1500–7800)
Neutrophils Relative %: 61.3 %
Platelets: 191 10*3/uL (ref 140–400)
RBC: 3.97 10*6/uL (ref 3.80–5.10)
RDW: 12.1 % (ref 11.0–15.0)
Total Lymphocyte: 25.6 %
WBC: 6.8 10*3/uL (ref 3.8–10.8)

## 2019-04-30 LAB — LIPID PANEL
Cholesterol: 193 mg/dL (ref <200)
HDL: 69 mg/dL (ref >=50)
LDL Cholesterol (Calc): 105 mg/dL (calc) — ABNORMAL HIGH
Non-HDL Cholesterol (Calc): 124 mg/dL (calc) (ref <130)
Total CHOL/HDL Ratio: 2.8 (calc) (ref <5.0)
Triglycerides: 93 mg/dL (ref <150)

## 2019-05-04 ENCOUNTER — Encounter: Payer: Self-pay | Admitting: Nurse Practitioner

## 2019-05-04 ENCOUNTER — Other Ambulatory Visit: Payer: Self-pay

## 2019-05-04 ENCOUNTER — Ambulatory Visit (INDEPENDENT_AMBULATORY_CARE_PROVIDER_SITE_OTHER): Payer: Medicare Other | Admitting: Nurse Practitioner

## 2019-05-04 DIAGNOSIS — E2839 Other primary ovarian failure: Secondary | ICD-10-CM

## 2019-05-04 DIAGNOSIS — Z0000 Encounter for general adult medical examination without abnormal findings: Secondary | ICD-10-CM | POA: Diagnosis not present

## 2019-05-04 NOTE — Progress Notes (Signed)
    This service is provided via telemedicine  No vital signs collected/recorded due to the encounter was a telemedicine visit.   Location of patient (ex: home, work): Home  Patient consents to a telephone visit:  Yes  Location of the provider (ex: office, home):  [employer], Office   Name of any referring provider: Gayland Curry, DO  Names of all persons participating in the telemedicine service and their role in the encounter: S.Chrae B/CMA, Sherrie Mustache, NP, and Patient     Time spent on call:  7 min with medical assistant

## 2019-05-04 NOTE — Progress Notes (Signed)
Subjective:   NEELIE WELSHANS is a 73 y.o. female who presents for Medicare Annual (Subsequent) preventive examination.  Review of Systems:   Cardiac Risk Factors include: advanced age (>37mn, >>28women);obesity (BMI >30kg/m2);sedentary lifestyle;hypertension;family history of premature cardiovascular disease     Objective:     Vitals: There were no vitals taken for this visit.  There is no height or weight on file to calculate BMI.  Advanced Directives 05/04/2019 06/23/2018 03/19/2018 02/19/2018 02/12/2018 01/08/2018 12/30/2017  Does Patient Have a Medical Advance Directive? Yes Yes Yes Yes Yes Yes Yes  Type of Advance Directive Out of facility DNR (pink MOST or yellow form) Out of facility DNR (pink MOST or yellow form) Out of facility DNR (pink MOST or yellow form);[place] will Out of facility DNR (pink MOST or yellow form) Out of facility DNR (pink MOST or yellow form)  Does patient want to make changes to medical advance directive? No - Patient declined No - Patient declined No - Patient declined No - Patient declined No - Patient declined No - Patient declined No - Patient declined  Copy of [place] Chart? - - No - copy requested No - copy requested No - copy requested - -  Pre-existing out of facility DNR order (yellow form or pink MOST form) - Yellow form placed in chart (order not valid for inpatient use);Pink MOST form placed in chart (order not valid for inpatient use) Yellow form placed in chart (order not valid for inpatient use) - - Yellow form placed in chart (order not valid for inpatient use);Pink MOST form placed in chart (order not valid for inpatient use) Yellow form placed in chart (order not valid for inpatient use);Pink MOST form placed in chart (order not valid for inpatient use)    Tobacco Social History   Tobacco Use  Smoking Status Never  Smoker  Smokeless Tobacco Never Used     Counseling given: Not Answered   Clinical Intake:  Pre-visit preparation completed: Yes  Pain : No/denies pain     BMI - recorded: 36.79 Nutritional Status: BMI > 30  Obese Nutritional Risks: None Diabetes: No  How often do you need to have someone help you when you read instructions, pamphlets, or other written materials from your doctor or pharmacy?: 1 - Never What is the last grade level you completed in school?: 1 year of college  Interpreter Needed?: No     Past Medical History:  Diagnosis Date  . Abnormal EKG   . Abnormal finding on thyroid function test   . Abnormal liver function test   . Acute edema   . Alcoholic cirrhosis ([place] 019/50/9326  Possible NASH overlap (MELD 13)  . Anemia   . Anxiety   . Arthritis   . Ascites   . Back pain   . Breast mass   . Chronic low back pain   . Complete right rotator cuff tear   . Complication of anesthesia    during first surgery- woke up during surgery many years ago  . Compression fracture of L4 lumbar vertebra   . Dermatitis, eczematoid   . Difficulty breathing   . Gallstones   . History of adenocarcinoma of breast   . Hypercholesterolemia   . Hyperkalemia   . Hypertension   . Hypokalemia   . Hypothyroidism   . Insomnia   . Knee pain   . Leukocytosis   .  Lymphadenopathy   . Macrocytosis   . Memory loss or impairment   . Menopause   . MGUS (monoclonal gammopathy of unknown significance)   . Osteoarthritis   . Osteoarthritis of right knee   . Other cirrhosis of liver ([place])   . Renal insufficiency syndrome   . Right knee DJD   . Solitary thyroid nodule   . Unspecified lump in the left breast, unspecified quadrant   . Vitamin B 12 deficiency   . 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[place])    Past Surgical History:  Procedure Laterality Date  . BREAST SURGERY  2014   Removed lymphnodes  . EYE SURGERY  2006   bilateral cataract  . JOINT REPLACEMENT     right   (2017)and left knee  . LAPAROSCOPIC CHOLECYSTECTOMY SINGLE SITE WITH INTRAOPERATIVE CHOLANGIOGRAM N/A 02/19/2018   Procedure: LAPAROSCOPIC CHOLECYSTECTOMY;  Surgeon: [name], MD;  [facility];  Service: General;  Laterality: N/A;  . LESION REMOVAL  09/2015   tubular adenoma-4 subcentimeter lesions  . LIVER BIOPSY  02/19/2018   Procedure: LIVER BIOPSY;  Surgeon: [name], MD;  Location[facility];  Service: General;;  . right thumb surgery    . TONSILLECTOMY    . TOTAL KNEE ARTHROPLASTY Right 09/05/2016  . UMBILICAL HERNIA REPAIR  02/19/2018   Procedure: REPAIR OF INCARCERATED UMBILICAL HERNIA;  Surgeon: [name], MD;  [facility];  Service: General;;   Family History  Problem Relation Age of Onset  . Breast cancer Mother   . Colon cancer Neg Hx   . Stomach cancer Neg Hx   . Rectal cancer Neg Hx   . Liver cancer Neg Hx   . Esophageal cancer Neg Hx    Social History   Socioeconomic History  . Marital status: Widowed    Spouse name: Not on file  . Number of children: 6  . Years of education: Not on file  . Highest education level: Not on file  Occupational History  . Occupation: retired  Scientific laboratory technician  . Financial resource strain: Not hard at all  . Food insecurity:    Worry: Never true    Inability: Never true  . Transportation needs:    Medical: No    Non-medical: No  Tobacco Use  . Smoking status: Never Smoker  . Smokeless tobacco: Never Used  Substance and Sexual Activity  . Alcohol use: No    Comment: 6 or more per day in the past.   . Drug use: No  . Sexual activity: Not Currently  Lifestyle  . Physical activity:    Days per week: 7 days    Minutes per session: 20 min  . Stress: To some extent  Relationships  . Social connections:    Talks on phone: More than three times a week    Gets together: More than three times a week    Attends religious service: Never    Active member of club or organization: No    Attends meetings of clubs or  organizations: Never    Relationship status: Widowed  Other Topics Concern  . Not on file  Social History Narrative   Social History       Diet? Regular      Do you drink/eat things with caffeine? 1 cup      Marital status?                        married  What year were you married?      Do you live in a house, apartment, assisted living, condo, trailer, etc.? townhouse      Is it one or more stories? no      How many persons live in your home? two      Do you have any pets in your home? (please list) no      Highest level of education completed? High school      Current or past profession: realtor      Do you exercise?      no                                Type & how often?      Advanced Directives      Do you have a living will? yes      Do you have a DNR form?      yes                            If not, do you want to discuss one?      Do you have signed POA/HPOA for forms? yes      Functional Status      Do you have difficulty bathing or dressing yourself? no      Do you have difficulty preparing food or eating? no      Do you have difficulty managing your medications? no      Do you have difficulty managing your finances? no      Do you have difficulty affording your medications? no    Outpatient Encounter Medications as of 05/04/2019  Medication Sig  . Cholecalciferol (VITAMIN D3) 2000 units TABS Take 1 tablet by mouth every morning.  . citalopram (CELEXA) 40 MG tablet TAKE ONE TABLET BY MOUTH DAILY  . furosemide (LASIX) 20 MG tablet Take 1 tablet (20 mg total) by mouth every morning.  . gabapentin (NEURONTIN) 100 MG capsule Take 1 capsule (100 mg total) by mouth at bedtime.  Marland Kitchen ketoconazole (NIZORAL) 2 % shampoo Apply 1 application topically 2 (two) times a week.  . lactulose (CHRONULAC) 10 GM/15ML solution Take 15 g by mouth daily.  Marland Kitchen levothyroxine (SYNTHROID, LEVOTHROID) 75 MCG tablet Take 1 tablet (75 mcg total) by mouth daily before  breakfast.  . Multiple Vitamin (MULTIVITAMIN) tablet Take 1 tablet by mouth daily.  . propranolol (INDERAL) 20 MG tablet TAKE ONE TABLET BY MOUTH EVERY MORNING  . spironolactone (ALDACTONE) 50 MG tablet Take 1 tablet (50 mg total) by mouth daily. Pharmacy-please d/c rx for spironolactone 100 mg dosage  . vitamin B-12 (CYANOCOBALAMIN) 1000 MCG tablet Take 1,000 mcg by mouth every morning.   No facility-administered encounter medications on file as of 05/04/2019.     Activities of Daily Living In your present state of health, do you have any difficulty performing the following activities: 05/04/2019  Hearing? N  Vision? N  Difficulty concentrating or making decisions? Y  Comment some issues concentrating  Walking or climbing stairs? Y  Comment uses cane and walker  Dressing or bathing? N  Doing errands, shopping? Y  Comment not familiar with area- new to [place] in the last 2 years  Preparing Food and eating ? N  Using the Toilet? N  In the past six months, have you accidently leaked urine? Y  Managing your Medications? N  Managing your Finances? N  Housekeeping or managing your Housekeeping? N  Some recent data might be hidden    Patient Care Team: Gayland Curry, DO as PCP - General (Geriatric Medicine) Armbruster, [name], MD as Consulting Physician (Gastroenterology) [name], MD as Consulting Physician (General Surgery)    Assessment:   This is a routine wellness examination for [place].  Exercise Activities and Dietary recommendations Current Exercise Habits: Home exercise routine, Type of exercise: walking, Time (Minutes): 30, Frequency (Times/Week): 4, Weekly Exercise (Minutes/Week): 120, Intensity: Mild, Exercise limited by: orthopedic condition(s)  Goals    . Patient Stated     To maintain current level of health       Fall Risk Fall Risk  05/04/2019 02/05/2019 10/06/2018 06/23/2018 03/19/2018  Falls in the past year? 0 0 No No No  Number falls in past  yr: 0 0 - - -  Injury with Fall? 0 0 - - -   Is the patient's home free of loose throw rugs in walkways, pet beds, electrical cords, etc?   yes      Grab bars in the bathroom? yes      Handrails on the stairs?   yes      Adequate lighting?   yes  Timed Get Up and Go performed: na  Depression Screen PHQ 2/9 Scores 05/04/2019 10/06/2018 06/23/2018 03/19/2018  PHQ - 2 Score 0 0 0 0     Cognitive Function MMSE - Mini Mental State Exam 03/19/2018  Orientation to time 5  Orientation to Place 5  Registration 3  Attention/ Calculation 5  Recall 3  Language- name 2 objects 2  Language- repeat 1  Language- follow 3 step command 3  Language- read & follow direction 1  Write a sentence 1  Copy design 1  Total score 30     6CIT Screen 05/04/2019  What Year? 0 points  What month? 0 points  What time? 0 points  Count back from 20 0 points  Months in reverse 2 points  Repeat phrase 0 points  Total Score 2    Immunization History  Administered Date(s) Administered  . Hep A / Hep B 01/13/2018, 02/12/2018, 07/14/2018  . Influenza Inj Mdck Quad Pf 10/12/2013  . Influenza, High Dose Seasonal PF 09/09/2017, 10/06/2018  . Influenza-Unspecified 09/10/2007, 09/22/2008, 10/15/2012, 09/29/2014, 08/24/2016  . Pneumococcal Conjugate-13 12/30/2017  . Pneumococcal Polysaccharide-23 06/25/2013  . Tdap 09/02/2015  . Zoster 12/28/2013  . Zoster Recombinat (Shingrix) 06/25/2017, 10/03/2017    Qualifies for Shingles Vaccine?yes, had shringrix  Screening Tests Health Maintenance  Topic Date Due  . INFLUENZA VACCINE  07/25/2019  . MAMMOGRAM  12/10/2020  . COLONOSCOPY  09/10/2021  . TETANUS/TDAP  09/01/2025  . DEXA SCAN  Completed  . Hepatitis C Screening  Completed  . PNA vac Low Risk Adult  Completed    Cancer Screenings: Lung: Low Dose CT Chest recommended if Age 62-80 years, 30 pack-year currently smoking OR have quit w/in 15years. Patient does not qualify. Breast:  Up to date on  Mammogram? Yes   Up to date of Bone Density/Dexa? No Colorectal: up to date  Additional Screenings: : Hepatitis C Screening: negative      Plan:      I have personally reviewed and noted the following in the patient's chart:   . Medical and social history . Use of alcohol, tobacco or illicit drugs  . Current medications and supplements . Functional ability and status . Nutritional status . Physical  activity . Advanced directives . List of other physicians . Hospitalizations, surgeries, and ER visits in previous 12 months . Vitals . Screenings to include cognitive, depression, and falls . Referrals and appointments  In addition, I have reviewed and discussed with patient certain preventive protocols, quality metrics, and best practice recommendations. A written personalized care plan for preventive services as well as general preventive health recommendations were provided to patient.     [name], NP  05/04/2019

## 2019-05-04 NOTE — Patient Instructions (Signed)
Christina [name] , Thank you for taking time to come for your Medicare Wellness Visit. I appreciate your ongoing commitment to your health goals. Please review the following plan we discussed and let me know if I can assist you in the future.   Screening recommendations/referrals: Colonoscopy up to date Mammogram up to date Bone Density ordered today Recommended yearly ophthalmology/optometry visit for glaucoma screening and checkup Recommended yearly dental visit for hygiene and checkup  Vaccinations: Influenza vaccine due 07/2019 Pneumococcal vaccine up to date Tdap vaccine up to date Shingles vaccine up to date  Advanced directives: on file.   Conditions/risks identified: none.  Next appointment: 1 year    Preventive Care 75 Years and Older, Female Preventive care refers to lifestyle choices and visits with your health care provider that can promote health and wellness. What does preventive care include?  A yearly physical exam. This is also called an annual well check.  Dental exams once or twice a year.  Routine eye exams. Ask your health care provider how often you should have your eyes checked.  Personal lifestyle choices, including:  Daily care of your teeth and gums.  Regular physical activity.  Eating a healthy diet.  Avoiding tobacco and drug use.  Limiting alcohol use.  Practicing safe sex.  Taking low-dose aspirin every day.  Taking vitamin and mineral supplements as recommended by your health care provider. What happens during an annual well check? The services and screenings done by your health care provider during your annual well check will depend on your age, overall health, lifestyle risk factors, and family history of disease. Counseling  Your health care provider may ask you questions about your:  Alcohol use.  Tobacco use.  Drug use.  Emotional well-being.  Home and relationship well-being.  Sexual activity.  Eating habits.  History  of falls.  Memory and ability to understand (cognition).  Work and work Statistician.  Reproductive health. Screening  You may have the following tests or measurements:  Height, weight, and BMI.  Blood pressure.  Lipid and cholesterol levels. These may be checked every 5 years, or more frequently if you are over 75 years old.  Skin check.  Lung cancer screening. You may have this screening every year starting at age 75 if you have a 30-pack-year history of smoking and currently smoke or have quit within the past 15 years.  Fecal occult blood test (FOBT) of the stool. You may have this test every year starting at age 75.  Flexible sigmoidoscopy or colonoscopy. You may have a sigmoidoscopy every 5 years or a colonoscopy every 10 years starting at age 75.  Hepatitis C blood test.  Hepatitis B blood test.  Sexually transmitted disease (STD) testing.  Diabetes screening. This is done by checking your blood sugar (glucose) after you have not eaten for a while (fasting). You may have this done every 1-3 years.  Bone density scan. This is done to screen for osteoporosis. You may have this done starting at age 75.  Mammogram. This may be done every 1-2 years. Talk to your health care provider about how often you should have regular mammograms. Talk with your health care provider about your test results, treatment options, and if necessary, the need for more tests. Vaccines  Your health care provider may recommend certain vaccines, such as:  Influenza vaccine. This is recommended every year.  Tetanus, diphtheria, and acellular pertussis (Tdap, Td) vaccine. You may need a Td booster every 10 years.  Zoster vaccine.  You may need this after age 75.  Pneumococcal 13-valent conjugate (PCV13) vaccine. One dose is recommended after age 75.  Pneumococcal polysaccharide (PPSV23) vaccine. One dose is recommended after age 75. Talk to your health care provider about which screenings and  vaccines you need and how often you need them. This information is not intended to replace advice given to you by your health care provider. Make sure you discuss any questions you have with your health care provider. Document Released: 01/06/2016 Document Revised: 08/29/2016 Document Reviewed: 10/11/2015 Elsevier Interactive Patient Education  2017 Section Prevention in the Home Falls can cause injuries. They can happen to people of all ages. There are many things you can do to make your home safe and to help prevent falls. What can I do on the outside of my home?  Regularly fix the edges of walkways and driveways and fix any cracks.  Remove anything that might make you trip as you walk through a door, such as a raised step or threshold.  Trim any bushes or trees on the path to your home.  Use bright outdoor lighting.  Clear any walking paths of anything that might make someone trip, such as rocks or tools.  Regularly check to see if handrails are loose or broken. Make sure that both sides of any steps have handrails.  Any raised decks and porches should have guardrails on the edges.  Have any leaves, snow, or ice cleared regularly.  Use sand or salt on walking paths during winter.  Clean up any spills in your garage right away. This includes oil or grease spills. What can I do in the bathroom?  Use night lights.  Install grab bars by the toilet and in the tub and shower. Do not use towel bars as grab bars.  Use non-skid mats or decals in the tub or shower.  If you need to sit down in the shower, use a plastic, non-slip stool.  Keep the floor dry. Clean up any water that spills on the floor as soon as it happens.  Remove soap buildup in the tub or shower regularly.  Attach bath mats securely with double-sided non-slip rug tape.  Do not have throw rugs and other things on the floor that can make you trip. What can I do in the bedroom?  Use night lights.   Make sure that you have a light by your bed that is easy to reach.  Do not use any sheets or blankets that are too big for your bed. They should not hang down onto the floor.  Have a firm chair that has side arms. You can use this for support while you get dressed.  Do not have throw rugs and other things on the floor that can make you trip. What can I do in the kitchen?  Clean up any spills right away.  Avoid walking on wet floors.  Keep items that you use a lot in easy-to-reach places.  If you need to reach something above you, use a strong step stool that has a grab bar.  Keep electrical cords out of the way.  Do not use floor polish or wax that makes floors slippery. If you must use wax, use non-skid floor wax.  Do not have throw rugs and other things on the floor that can make you trip. What can I do with my stairs?  Do not leave any items on the stairs.  Make sure that there are handrails on  both sides of the stairs and use them. Fix handrails that are broken or loose. Make sure that handrails are as long as the stairways.  Check any carpeting to make sure that it is firmly attached to the stairs. Fix any carpet that is loose or worn.  Avoid having throw rugs at the top or bottom of the stairs. If you do have throw rugs, attach them to the floor with carpet tape.  Make sure that you have a light switch at the top of the stairs and the bottom of the stairs. If you do not have them, ask someone to add them for you. What else can I do to help prevent falls?  Wear shoes that:  Do not have high heels.  Have rubber bottoms.  Are comfortable and fit you well.  Are closed at the toe. Do not wear sandals.  If you use a stepladder:  Make sure that it is fully opened. Do not climb a closed stepladder.  Make sure that both sides of the stepladder are locked into place.  Ask someone to hold it for you, if possible.  Clearly mark and make sure that you can see:  Any  grab bars or handrails.  First and last steps.  Where the edge of each step is.  Use tools that help you move around (mobility aids) if they are needed. These include:  Canes.  Walkers.  Scooters.  Crutches.  Turn on the lights when you go into a dark area. Replace any light bulbs as soon as they burn out.  Set up your furniture so you have a clear path. Avoid moving your furniture around.  If any of your floors are uneven, fix them.  If there are any pets around you, be aware of where they are.  Review your medicines with your doctor. Some medicines can make you feel dizzy. This can increase your chance of falling. Ask your doctor what other things that you can do to help prevent falls. This information is not intended to replace advice given to you by your health care provider. Make sure you discuss any questions you have with your health care provider. Document Released: 10/06/2009 Document Revised: 05/17/2016 Document Reviewed: 01/14/2015 Elsevier Interactive Patient Education  2017 [employer].

## 2019-05-07 ENCOUNTER — Other Ambulatory Visit: Payer: Self-pay

## 2019-05-07 ENCOUNTER — Ambulatory Visit (INDEPENDENT_AMBULATORY_CARE_PROVIDER_SITE_OTHER): Payer: Medicare Other | Admitting: Internal Medicine

## 2019-05-07 ENCOUNTER — Encounter: Payer: Self-pay | Admitting: Internal Medicine

## 2019-05-07 ENCOUNTER — Encounter: Payer: Medicare Other | Admitting: Nurse Practitioner

## 2019-05-07 DIAGNOSIS — C880 Waldenstrom macroglobulinemia: Secondary | ICD-10-CM

## 2019-05-07 DIAGNOSIS — I700 Atherosclerosis of aorta: Secondary | ICD-10-CM

## 2019-05-07 DIAGNOSIS — K7031 Alcoholic cirrhosis of liver with ascites: Secondary | ICD-10-CM

## 2019-05-07 DIAGNOSIS — G5602 Carpal tunnel syndrome, left upper limb: Secondary | ICD-10-CM

## 2019-05-07 DIAGNOSIS — F325 Major depressive disorder, single episode, in full remission: Secondary | ICD-10-CM | POA: Diagnosis not present

## 2019-05-07 DIAGNOSIS — H9313 Tinnitus, bilateral: Secondary | ICD-10-CM

## 2019-05-07 DIAGNOSIS — E890 Postprocedural hypothyroidism: Secondary | ICD-10-CM | POA: Diagnosis not present

## 2019-05-07 NOTE — Patient Instructions (Addendum)
I recommend a carpal tunnel splint for your left wrist (like this one in the photo).  It will help keep the carpal tunnel where your nerve runs through from being compressed when you are sleeping.  It's best to wear the splint nightly.  If you continue with pain, we'll need you to see the hand specialist.    Keep up the good work with finding things to do and staying more active.  It sounds like you are adjusting to being here and doing much better.  :)

## 2019-05-07 NOTE — Progress Notes (Signed)
Patient ID: Christina [name], female   DOB: [birth year], 73 y.o.   MRN: [id number] This service is provided via telemedicine  No vital signs collected/recorded due to the encounter was a telemedicine visit.   Location of patient (ex: home, work):  HOME  Patient consents to a telephone visit:  YES  Location of the provider (ex: office, home):  OFFICE  Name of any referring provider:  Mazen Marcin DO  Names of all persons participating in the telemedicine service and their role in the encounter:  PATIENT, [name], Jaklyn Alen DO  Time spent on call:  2:36    Location:  [facility] clinic Provider:  Abass Misener L. Mariea Clonts, D.O., C.M.D.  Code Status: DNR Goals of Care:  Advanced Directives 05/07/2019  Does Patient Have a Medical Advance Directive? Yes  Type of Advance Directive Out of facility DNR (pink MOST or yellow form)  Does patient want to make changes to medical advance directive? No - Patient declined  Copy of [place] in Chart? -  Pre-existing out of facility DNR order (yellow form or pink MOST form) Yellow form placed in chart (order not valid for inpatient use);Pink MOST form placed in chart (order not valid for inpatient use)     Chief Complaint  Patient presents with  . Medical Management of Chronic Issues    3MTH FOLLOW-UP    HPI: Patient is a 73 y.o. female seen today for medical management of chronic diseases.    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  Got her garden going.  She crochets when her hands allow.  Her hands are feeling like they're going to sleep.  She is taking a pill for this but it's quit helping now.  She's on gabapentin at night.  She had her right hand done for carpal tunnel and her thumb for arthritis.  The left is far worse.  It will even fall asleep when she's amid crocheting.    She has ringing in her ears also.  Says her husband had hearing loss that was associated.  She can hear a pin drop.   The tinnitus gets loud.  She will hear the humming sound when she goes to bed.  No pain.  Nothing is found when we look in her ears.    She has the days and moments where she misses her husband.    She's been baking bread which might be contributing to increased LDL.    She still doesn't walk as much as she should.  She was never athletic.  With 7 kids and a job, that didn't fit in.    TSH normal after prescription changed.    She says her brother and mother had some liver disease.  She is following with GI for her cirrhosis and liver mass.  CBC stable in terms of waldenstrom's--needs annual spep/upep next time.  Past Medical History:  Diagnosis Date  . Abnormal EKG   . Abnormal finding on thyroid function test   . Abnormal liver function test   . Acute edema   . Alcoholic cirrhosis ([place]) 46/27/0350   Possible NASH overlap (MELD 13)  . Anemia   . Anxiety   . Arthritis   . Ascites   . Back pain   . Breast mass   . Chronic low back pain   . Complete right rotator cuff tear   . Complication of anesthesia    during first surgery- woke up during surgery  many years ago  . Compression fracture of L4 lumbar vertebra   . Dermatitis, eczematoid   . Difficulty breathing   . Gallstones   . History of adenocarcinoma of breast   . Hypercholesterolemia   . Hyperkalemia   . Hypertension   . Hypokalemia   . Hypothyroidism   . Insomnia   . Knee pain   . Leukocytosis   . Lymphadenopathy   . Macrocytosis   . Memory loss or impairment   . Menopause   . MGUS (monoclonal gammopathy of unknown significance)   . Osteoarthritis   . Osteoarthritis of right knee   . Other cirrhosis of liver ([place])   . Renal insufficiency syndrome   . Right knee DJD   . Solitary thyroid nodule   . Unspecified lump in the left breast, unspecified quadrant   . Vitamin B 12 deficiency   . 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[place])     Past Surgical History:  Procedure Laterality Date  . BREAST SURGERY  2014    Removed lymphnodes  . EYE SURGERY  2006   bilateral cataract  . JOINT REPLACEMENT     right  (2017)and left knee  . LAPAROSCOPIC CHOLECYSTECTOMY SINGLE SITE WITH INTRAOPERATIVE CHOLANGIOGRAM N/A 02/19/2018   Procedure: LAPAROSCOPIC CHOLECYSTECTOMY;  Surgeon: [name], MD;  Location: [facility];  Service: General;  Laterality: N/A;  . LESION REMOVAL  09/2015   tubular adenoma-4 subcentimeter lesions  . LIVER BIOPSY  02/19/2018   Procedure: LIVER BIOPSY;  Surgeon: [name], MD;  Location: [facility];  Service: General;;  . right thumb surgery    . TONSILLECTOMY    . TOTAL KNEE ARTHROPLASTY Right 09/05/2016  . UMBILICAL HERNIA REPAIR  02/19/2018   Procedure: REPAIR OF INCARCERATED UMBILICAL HERNIA;  Surgeon: [name], MD;  Location: [facility];  Service: General;;    No Known Allergies  Outpatient Encounter Medications as of 05/07/2019  Medication Sig  . Cholecalciferol (VITAMIN D3) 2000 units TABS Take 1 tablet by mouth every morning.  . citalopram (CELEXA) 40 MG tablet TAKE ONE TABLET BY MOUTH DAILY  . furosemide (LASIX) 20 MG tablet Take 1 tablet (20 mg total) by mouth every morning.  . gabapentin (NEURONTIN) 100 MG capsule Take 1 capsule (100 mg total) by mouth at bedtime.  Marland Kitchen ketoconazole (NIZORAL) 2 % shampoo Apply 1 application topically 2 (two) times a week.  . lactulose (CHRONULAC) 10 GM/15ML solution Take 15 g by mouth daily.  Marland Kitchen levothyroxine (SYNTHROID, LEVOTHROID) 75 MCG tablet Take 1 tablet (75 mcg total) by mouth daily before breakfast.  . Multiple Vitamin (MULTIVITAMIN) tablet Take 1 tablet by mouth daily.  . propranolol (INDERAL) 20 MG tablet TAKE ONE TABLET BY MOUTH EVERY MORNING  . spironolactone (ALDACTONE) 50 MG tablet Take 1 tablet (50 mg total) by mouth daily. Pharmacy-please d/c rx for spironolactone 100 mg dosage  . vitamin B-12 (CYANOCOBALAMIN) 1000 MCG tablet Take 1,000 mcg by mouth every morning.   No facility-administered encounter medications on file as of  05/07/2019.     Review of Systems:  Review of Systems  Constitutional: Positive for malaise/fatigue. Negative for chills and fever.  HENT: Positive for tinnitus. Negative for hearing loss.   Eyes: Negative for blurred vision.  Respiratory: Negative for cough and shortness of breath.   Cardiovascular: Negative for chest pain, palpitations and leg swelling.  Gastrointestinal: Negative for abdominal pain, blood in stool, constipation, diarrhea and melena.  Genitourinary: Negative for dysuria.  Musculoskeletal: Positive for joint pain. Negative for falls.  Right shoulder  Skin: Negative for itching and rash.  Neurological: Positive for tingling and sensory change. Negative for loss of consciousness.  Endo/Heme/Allergies: Bruises/bleeds easily.  Psychiatric/Behavioral: Positive for memory loss. Negative for depression. The patient is not nervous/anxious and does not have insomnia.        Spirits much better    Health Maintenance  Topic Date Due  . INFLUENZA VACCINE  07/25/2019  . MAMMOGRAM  12/10/2020  . COLONOSCOPY  09/10/2021  . TETANUS/TDAP  09/01/2025  . DEXA SCAN  Completed  . Hepatitis C Screening  Completed  . PNA vac Low Risk Adult  Completed   Physical Exam Could not be done due to non face-to-face visit  Labs reviewed: Basic Metabolic Panel: Recent Labs    09/01/18 0852 09/01/18 0957 10/06/18 1220 02/05/19 1213 04/02/19 0857 04/30/19 0846  NA 138  --   --  138  --  140  K 4.4  --   --  4.2  --  4.2  CL 101  --   --  99  --  103  CO2 29  --   --  30  --  31  GLUCOSE 87  --   --  72  --  88  BUN 15  --   --  12  --  14  CREATININE 0.93 0.90  --  0.79  --  0.83  CALCIUM 9.5  --   --  9.6  --  9.2  TSH  --   --  0.72 0.36* 2.02  --    Liver Function Tests: Recent Labs    06/23/18 0920 02/05/19 1213 04/30/19 0846  AST 27 21 24   ALT 27 14 17   BILITOT 0.5 0.7 0.5  PROT 7.2  7.1 7.2 7.0   No results for input(s): LIPASE, AMYLASE in the last 8760  hours. Recent Labs    06/23/18 0920  AMMONIA 62   CBC: Recent Labs    06/23/18 0920 02/05/19 1213 04/30/19 0846  WBC 8.1 10.0 6.8  NEUTROABS 4,941 7,170 4,168  HGB 12.1 12.0 12.1  HCT 36.2 35.3 36.3  MCV 88.7 89.1 91.4  PLT 218 226 191   Lipid Panel: Recent Labs    02/05/19 1213 04/30/19 0846  CHOL 181 193  HDL 64 69  LDLCALC 100* 105*  TRIG 78 93  CHOLHDL 2.8 2.8   No results found for: HGBA1C  Procedures since last visit: No results found.  Annual wellness visit reviewed.  Assessment/Plan 1. Left carpal tunnel syndrome -recommended wrist splint at night -is on gabapentin qhs so continue it as it did help some  2. Tinnitus of both ears -getting louder--may be related to her waldenstrom's possibly based on what I've read in the past -no ear pathology has been noted on my exams -she understands it may be something she's stuck with -no change in hearing  3. Postoperative hypothyroidism -last tsh now normal  4. Depression, major, in remission [facility]) -doing great it seems--much more active and sociable; still grieves loss of husband, but seems appropriate  -finally adjusting to [place] and taking up some old hobbies  5. Waldenstrom's macroglobulinemia ([place]) -will need spep/upep labs next visit; cbc stable and normal  6. Alcoholic cirrhosis of liver with ascites ([facility]) -alcoholism in remission -following with GI on this--saw Dr. Havery Moros in Feb -continues on diuretic, propranolol, and lactulose -MRI being followed serially for her liver mass--last was in March which showed:  1. Advanced cirrhotic changes in the liver with  areas of confluent hepatic fibrosis in the periphery of the right lobe of the liver, similar to the prior study. No suspicious hypervascular lesion to suggest hepatocellular carcinoma noted at this time.  Marland Kitchen 7.Aortic atherosclerosis was noted incidentally on her MRI abdomen -encouraged healthy eating and walking program, staying active -pt  and family are not being aggressive with her care--more comfort and qol focused  Labs/tests ordered:will do same day labs when seen next Next appt:  09/10/2019   Kwana Ringel L. Danyella Mcginty, D.O. [facility] [address], [place] [address] Cell Phone (Mon-Fri 8am-5pm):  [phone] On Call:  [phone] & follow prompts after 5pm & weekends Office Phone:  [phone] Office Fax:  [phone]

## 2019-06-27 ENCOUNTER — Other Ambulatory Visit: Payer: Self-pay | Admitting: Gastroenterology

## 2019-07-13 ENCOUNTER — Other Ambulatory Visit: Payer: Self-pay | Admitting: Internal Medicine

## 2019-07-13 DIAGNOSIS — L219 Seborrheic dermatitis, unspecified: Secondary | ICD-10-CM

## 2019-07-18 ENCOUNTER — Other Ambulatory Visit: Payer: Self-pay | Admitting: Gastroenterology

## 2019-07-31 ENCOUNTER — Other Ambulatory Visit: Payer: Self-pay | Admitting: Gastroenterology

## 2019-07-31 ENCOUNTER — Other Ambulatory Visit: Payer: Self-pay | Admitting: Internal Medicine

## 2019-07-31 DIAGNOSIS — I8510 Secondary esophageal varices without bleeding: Secondary | ICD-10-CM

## 2019-08-27 ENCOUNTER — Other Ambulatory Visit: Payer: Self-pay

## 2019-08-27 ENCOUNTER — Ambulatory Visit
Admission: RE | Admit: 2019-08-27 | Discharge: 2019-08-27 | Disposition: A | Discharge status: Home / Self Care | Payer: Medicare Other | Source: Ambulatory Visit | Attending: Nurse Practitioner | Admitting: Nurse Practitioner

## 2019-08-27 DIAGNOSIS — M8589 Other specified disorders of bone density and structure, multiple sites: Secondary | ICD-10-CM | POA: Diagnosis not present

## 2019-08-27 DIAGNOSIS — E2839 Other primary ovarian failure: Secondary | ICD-10-CM

## 2019-08-27 DIAGNOSIS — Z780 Asymptomatic menopausal state: Secondary | ICD-10-CM | POA: Diagnosis not present

## 2019-09-04 ENCOUNTER — Telehealth: Payer: Self-pay

## 2019-09-04 ENCOUNTER — Other Ambulatory Visit: Payer: Self-pay

## 2019-09-04 DIAGNOSIS — K7460 Unspecified cirrhosis of liver: Secondary | ICD-10-CM

## 2019-09-04 DIAGNOSIS — R932 Abnormal findings on diagnostic imaging of liver and biliary tract: Secondary | ICD-10-CM

## 2019-09-04 DIAGNOSIS — R188 Other ascites: Secondary | ICD-10-CM

## 2019-09-04 NOTE — Progress Notes (Signed)
6 Month MRI of liver. See 03-03-19 imaging

## 2019-09-04 NOTE — Telephone Encounter (Signed)
-----   Message from Timothy Lasso, RN sent at 03/04/2019 11:36 AM EDT ----- Repeat imaging of her liver in 6 months

## 2019-09-04 NOTE — Telephone Encounter (Signed)
Called and scheduled pt for MRI on Wednesday, September 23rd at 8:00am to arrive at 7:30am. NPO after 4am.   needs creatinine labs. Orders entered  Called and spoke to pt's daughter.  Would like to reschedule for next week, although not the 15th or 17th.  Changed appt to Friday, 9-18 at 8am to arrive at 7:30am. NPO after 4am.  Called and spoke to pt's daughter.  Relayed all information. She expressed understanding.

## 2019-09-08 DIAGNOSIS — H524 Presbyopia: Secondary | ICD-10-CM | POA: Diagnosis not present

## 2019-09-08 DIAGNOSIS — Z961 Presence of intraocular lens: Secondary | ICD-10-CM | POA: Diagnosis not present

## 2019-09-08 DIAGNOSIS — H04123 Dry eye syndrome of bilateral lacrimal glands: Secondary | ICD-10-CM | POA: Diagnosis not present

## 2019-09-10 ENCOUNTER — Other Ambulatory Visit: Payer: Self-pay

## 2019-09-10 ENCOUNTER — Ambulatory Visit (INDEPENDENT_AMBULATORY_CARE_PROVIDER_SITE_OTHER): Payer: Medicare Other | Admitting: Internal Medicine

## 2019-09-10 ENCOUNTER — Encounter: Payer: Self-pay | Admitting: Internal Medicine

## 2019-09-10 VITALS — BP 128/70 | HR 57 | Temp 97.9°F | Ht <= 58 in | Wt 179.0 lb

## 2019-09-10 DIAGNOSIS — Z23 Encounter for immunization: Secondary | ICD-10-CM | POA: Diagnosis not present

## 2019-09-10 DIAGNOSIS — C880 Waldenstrom macroglobulinemia: Secondary | ICD-10-CM

## 2019-09-10 DIAGNOSIS — F325 Major depressive disorder, single episode, in full remission: Secondary | ICD-10-CM | POA: Diagnosis not present

## 2019-09-10 DIAGNOSIS — K7031 Alcoholic cirrhosis of liver with ascites: Secondary | ICD-10-CM | POA: Diagnosis not present

## 2019-09-10 DIAGNOSIS — M79672 Pain in left foot: Secondary | ICD-10-CM

## 2019-09-10 DIAGNOSIS — G5602 Carpal tunnel syndrome, left upper limb: Secondary | ICD-10-CM | POA: Diagnosis not present

## 2019-09-10 NOTE — Progress Notes (Signed)
Location:  [facility]   Advanced Directives 09/10/2019  Does Patient Have a Medical Advance Directive? Yes  Type of Advance Directive Out of facility DNR (pink MOST or yellow form)  Does patient want to make changes to medical advance directive? No - Patient declined  Copy of [place] in Chart? -  Pre-existing out of facility DNR order (yellow form or pink MOST form) Pink MOST form placed in chart (order not valid for inpatient use)     Chief Complaint  Patient presents with   Medical Management of Chronic Issues    4 month routine followup, no concerns   Immunizations    Flu shot given today    HPI: Patient is a 73 y.o. female seen today for medical management of chronic diseases.    She fell two days ago while gardening. She reports injury to her left foot, left forearm and left upper back. Called her son when accident happened and he was able to help her up. She does not have a life- alert, but carries her cell phone at all times. Uses a cane to ambulate. Does not report any other injuries or falls.   She is very depressed. September is the month her husband passed away. She is tearful while mentioning it. Covid and politics have increased her depression and anxiety at times. She is taking her celexa daily. She is also trying to stay busy and avoid the news to calm her nerves.   Hands are still restless at night and feel like they "fall asleep" often. Takes gabapentin as described every night, but claims it does not work. She is worried she is dependent on it.   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 (MELD 13)   Anemia    Anxiety    Arthritis    Ascites    Back pain    Breast mass    Chronic low back pain    Complete right rotator cuff tear    Complication of anesthesia    during first surgery- woke up during surgery many years ago   Compression fracture of L4 lumbar vertebra    Dermatitis, eczematoid    Difficulty breathing    Gallstones    History of adenocarcinoma of breast    Hypercholesterolemia    Hyperkalemia    Hypertension    Hypokalemia    Hypothyroidism    Insomnia    Knee pain    Leukocytosis    Lymphadenopathy    Macrocytosis    Memory loss or impairment    Menopause    MGUS (monoclonal gammopathy of unknown significance)    Osteoarthritis    Osteoarthritis of right knee    Other cirrhosis of liver (HCC)    Renal insufficiency syndrome    Right knee DJD    Solitary thyroid nodule    Unspecified lump in the left breast, unspecified quadrant    Vitamin B 12 deficiency    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HCC)     Past Surgical History:  Procedure Laterality Date   BREAST  SURGERY  2014   Removed lymphnodes   EYE SURGERY  2006   bilateral cataract   JOINT REPLACEMENT     right  (2017)and left knee   LAPAROSCOPIC CHOLECYSTECTOMY SINGLE SITE WITH INTRAOPERATIVE CHOLANGIOGRAM N/A 02/19/2018   Procedure: LAPAROSCOPIC CHOLECYSTECTOMY;  Surgeon: [name], MD;  Location: [facility];  Service: General;  Laterality: N/A;   LESION REMOVAL  09/2015   tubular adenoma-4 subcentimeter lesions   LIVER BIOPSY  02/19/2018   Procedure: LIVER BIOPSY;  Surgeon: [name], MD;  Location: [facility];  Service: General;;   right thumb surgery     TONSILLECTOMY     TOTAL KNEE ARTHROPLASTY Right 85/27/7824   UMBILICAL HERNIA REPAIR  02/19/2018   Procedure: REPAIR OF INCARCERATED UMBILICAL HERNIA;  Surgeon: [name], MD;  Location: [facility];  Service: General;;    No Known Allergies  Outpatient  Encounter Medications as of 09/10/2019  Medication Sig   Cholecalciferol (VITAMIN D3) 2000 units TABS Take 1 tablet by mouth every morning.   citalopram (CELEXA) 40 MG tablet TAKE ONE TABLET BY MOUTH DAILY   furosemide (LASIX) 20 MG tablet TAKE ONE TABLET BY MOUTH EVERY MORNING   gabapentin (NEURONTIN) 100 MG capsule Take 1 capsule (100 mg total) by mouth at bedtime.   ketoconazole (NIZORAL) 2 % shampoo APPLY TO AFFECTED AREA(S) 2 TIMES PER WEEK   lactulose (CHRONULAC) 10 GM/15ML solution TAKE 15 ML'S BY MOUTH THREE TIMES A DAY   levothyroxine (SYNTHROID, LEVOTHROID) 75 MCG tablet Take 1 tablet (75 mcg total) by mouth daily before breakfast.   Multiple Vitamin (MULTIVITAMIN) tablet Take 1 tablet by mouth daily.   propranolol (INDERAL) 20 MG tablet TAKE ONE TABLET BY MOUTH EVERY MORNING   spironolactone (ALDACTONE) 50 MG tablet Take 1 tablet (50 mg total) by mouth daily. Pharmacy-please d/c rx for spironolactone 100 mg dosage   vitamin B-12 (CYANOCOBALAMIN) 1000 MCG tablet Take 1,000 mcg by mouth every morning.   No facility-administered encounter medications on file as of 09/10/2019.     Review of Systems:  Review of Systems  Constitutional: Positive for activity change and fatigue. Negative for appetite change.       Recent fall  HENT: Negative for dental problem, hearing loss and trouble swallowing.   Eyes: Negative for photophobia and visual disturbance.  Respiratory: Negative for cough, shortness of breath and wheezing.   Cardiovascular: Negative for chest pain, palpitations and leg swelling.  Gastrointestinal: Negative for abdominal pain, constipation, diarrhea and nausea.  Endocrine: Negative for polydipsia, polyphagia and polyuria.  Genitourinary: Negative for dysuria, frequency, hematuria and vaginal bleeding.  Musculoskeletal: Positive for arthralgias, gait problem and myalgias.       Left foot pain, upper left back pain  Skin:       Bruising left foot, left upper  back, and left forearm Dry skin  Neurological: Positive for tremors and numbness. Negative for dizziness, syncope and headaches.  Hematological: Bruises/bleeds easily.  Psychiatric/Behavioral: Positive for dysphoric mood and sleep disturbance. The patient is not nervous/anxious.     Health Maintenance  Topic Date Due   MAMMOGRAM  12/10/2020   COLONOSCOPY  09/10/2021   TETANUS/TDAP  09/01/2025   INFLUENZA VACCINE  Completed   DEXA SCAN  Completed   Hepatitis C Screening  Completed   PNA vac Low Risk Adult  Completed    Physical Exam: Vitals:   09/10/19 1053  BP: 128/70  Pulse: (!) 57  Temp: 97.9 F (36.6 C)  TempSrc: Oral  SpO2: 97%  Weight: 179 lb (81.2 kg)  Height: 4' 9"  (1.448 m)   Body mass index is 38.74 kg/m. Physical Exam Vitals signs reviewed.  Constitutional:      General: She is not in acute distress.    Appearance: Normal appearance. She is normal weight. She is not ill-appearing.  HENT:     Head: Normocephalic.  Neck:     Vascular: No carotid bruit.  Cardiovascular:     Rate and Rhythm: Normal rate and regular rhythm.     Pulses: Normal pulses.     Heart sounds: Normal heart sounds. No murmur.  Pulmonary:     Effort: Pulmonary effort is normal.     Breath sounds: Normal breath sounds. No wheezing.  Abdominal:     General: Bowel sounds are normal.     Palpations: Abdomen is soft.     Tenderness: There is no abdominal tenderness.  Musculoskeletal:        General: Swelling, tenderness and signs of injury present.     Right lower leg: No edema.     Left lower leg: No edema.     Comments: Left anterior foot bruised and tender on palpation, effusion noted near toes. ROM limited with toes.   Upper left back tender on palpation near scapular area. Small bruising noted, quarter size.     Lymphadenopathy:     Cervical: No cervical adenopathy.  Skin:    General: Skin is dry.     Capillary Refill: Capillary refill takes less than 2 seconds.      Findings: Bruising present.     Comments: Left forearm bruising- quarter sized, purple color  Neurological:     General: No focal deficit present.     Mental Status: She is alert and oriented to person, place, and time.  Psychiatric:        Mood and Affect: Mood normal.        Behavior: Behavior normal.        Thought Content: Thought content normal.        Judgment: Judgment normal.     Labs reviewed: Basic Metabolic Panel: Recent Labs    10/06/18 1220 02/05/19 1213 04/02/19 0857 04/30/19 0846  NA  --  138  --  140  K  --  4.2  --  4.2  CL  --  99  --  103  CO2  --  30  --  31  GLUCOSE  --  72  --  88  BUN  --  12  --  14  CREATININE  --  0.79  --  0.83  CALCIUM  --  9.6  --  9.2  TSH 0.72 0.36* 2.02  --    Liver Function Tests: Recent Labs    02/05/19 1213 04/30/19 0846  AST 21 24  ALT 14 17  BILITOT 0.7 0.5  PROT 7.2 7.0   No results for input(s): LIPASE, AMYLASE in the last 8760 hours. No results for input(s): AMMONIA in the last 8760 hours. CBC: Recent Labs    02/05/19 1213 04/30/19 0846  WBC 10.0 6.8  NEUTROABS 7,170 4,168  HGB 12.0 12.1  HCT 35.3 36.3  MCV 89.1 91.4  PLT 226 191   Lipid Panel: Recent Labs    02/05/19 1213 04/30/19 0846  CHOL 181 193  HDL 64 69  LDLCALC 100* 105*  TRIG 78 93  CHOLHDL 2.8 2.8   No results found for: HGBA1C  Procedures since last visit: Dg Bone Density  Result  Date: 08/27/2019 EXAM: DUAL X-RAY ABSORPTIOMETRY (DXA) FOR BONE MINERAL DENSITY IMPRESSION: Referring Physician:  [name] Your patient completed a BMD test using Lunar IDXA DXA system ( analysis version: 16 ) manufactured by [employer]. Technologist: AW PATIENT: Name: Camay, Pedigo Patient ID: [id number] Birth Date: 1945/12/31 Height: 57.0 in. Sex: Female Measured: 08/27/2019 Weight: 172.0 lbs. Indications: Advanced Age, Caucasian, Celexa, Cirrhosis of Liver, Depression, Estrogen Deficient, Gabapentin, Height Loss (781.91), Hypothyroid,  Levothyroxine, Postmenopausal Fractures: Foot Treatments: Calcium (E943.0), Vitamin D (E933.5) ASSESSMENT: The BMD measured at Forearm Radius 33% is 0.707 g/cm2 with a T-score of -2.0. This patient is considered osteopenic according to [facility] [facility]) criteria. The scan quality is good. Lumbar spine was not utilized due to advanced degenerative changes. Site Region Measured Date Measured Age YA BMD Significant CHANGE T-score Left Forearm Radius 33% 08/27/2019 74.6 -2.0 0.707 g/cm2 DualFemur Neck Left 08/27/2019 74.6 -1.5 0.824 g/cm2 DualFemur Total Mean 08/27/2019 74.6 -0.5 0.946 g/cm2 World Health Organization [facility]) criteria for post-menopausal, Caucasian Women: Normal       T-score at or above -1 SD Osteopenia   T-score between -1 and -2.5 SD Osteoporosis T-score at or below -2.5 SD RECOMMENDATION: 1. All patients should optimize calcium and vitamin D intake. 2. Consider FDA approved medical therapies in postmenopausal women and men aged 32 years and older, based on the following: a. A hip or vertebral (clinical or morphometric) fracture b. T- score < or = -2.5 at the femoral neck or spine after appropriate evaluation to exclude secondary causes c. Low bone mass (T-score between -1.0 and -2.5 at the femoral neck or spine) and a 10 year probability of a hip fracture > or = 3% or a 10 year probability of a major osteoporosis-related fracture > or = 20% based on the [place]-adapted WHO algorithm d. Clinician judgment and/or patient preferences may indicate treatment for people with 10-year fracture probabilities above or below these levels FOLLOW-UP: Patients with diagnosis of osteoporosis or at high risk for fracture should have regular bone mineral density tests. For patients eligible for Medicare, routine testing is allowed once every 2 years. The testing frequency can be increased to one year for patients who have rapidly progressing disease, those who are receiving or discontinuing medical therapy to  restore bone mass, or have additional risk factors. FRAX* 10-year Probability of Fracture Based on femoral neck BMD: DualFemur (Left) Major Osteoporotic Fracture: 10.2% Hip Fracture:                1.9% Population:                  [place] (Caucasian) Risk Factors:                None *FRAX is a Materials engineer of the [employer] for Metabolic Bone Disease, a World Pharmacologist (WHO) [facility]. ASSESSMENT: The probability of a major osteoporotic fracture is 10.2 % within the next ten years. The probability of a hip fracture is 1.9 % within the next ten years. Electronically Signed   By: Marlaine Hind M.D.   On: 08/27/2019 11:50    Assessment/Plan 1. Need for influenza vaccination - administer Flu Vaccine QUAD High Dose(Fluad) vaccine  2. Left foot pain - left foot swollen and bruised, concerned fracture near 5th metatarsals - x-ray left foot - rest, ice, compression, elevation encouraged - encourage falls prevention plan  - consider life-alert or wearing cell phone at all times  3. Alcoholic  cirrhosis of liver with ascites ([place]) - MRI without contrast ordered tomorrow - complete metabolic panel with GFR- today - followed by GI  4. Depression, major, in remission ([place]) - doing well on Celexa - coping skills are healthy - encouraged to contact PCP if depression and anxiety progress  5. Left carpal tunnel syndrome - patient denies progressive wrist pain  6. Waldenstrom's macroglobluinemia - has been stable, no signs of progression  Labs/tests ordered: complete metabolic panel with GFR- today Next appt:  Visit date not found

## 2019-09-10 NOTE — Patient Instructions (Signed)
cerave is a good cream for dry skin areas.  Increase gabapentin to 226m each night.  Please bring your phone with you wherever you go.  Let's get xrays of your left foot.  You may need a boot if there are fractures in there.

## 2019-09-11 ENCOUNTER — Ambulatory Visit (HOSPITAL_COMMUNITY)
Admission: RE | Admit: 2019-09-11 | Discharge: 2019-09-11 | Disposition: A | Discharge status: Home / Self Care | Payer: Medicare Other | Source: Ambulatory Visit | Attending: Gastroenterology | Admitting: Gastroenterology

## 2019-09-11 DIAGNOSIS — R932 Abnormal findings on diagnostic imaging of liver and biliary tract: Secondary | ICD-10-CM | POA: Diagnosis not present

## 2019-09-11 DIAGNOSIS — K7460 Unspecified cirrhosis of liver: Secondary | ICD-10-CM | POA: Diagnosis not present

## 2019-09-11 DIAGNOSIS — K5730 Diverticulosis of large intestine without perforation or abscess without bleeding: Secondary | ICD-10-CM | POA: Diagnosis not present

## 2019-09-11 DIAGNOSIS — R188 Other ascites: Secondary | ICD-10-CM | POA: Insufficient documentation

## 2019-09-11 LAB — COMPLETE METABOLIC PANEL WITH GFR
AG Ratio: 1.1 (calc) (ref 1.0–2.5)
ALT: 14 U/L (ref 6–29)
AST: 23 U/L (ref 10–35)
Albumin: 3.9 g/dL (ref 3.6–5.1)
Alkaline phosphatase (APISO): 64 U/L (ref 37–153)
BUN: 14 mg/dL (ref 7–25)
CO2: 31 mmol/L (ref 20–32)
Calcium: 9.5 mg/dL (ref 8.6–10.4)
Chloride: 101 mmol/L (ref 98–110)
Creat: 0.76 mg/dL (ref 0.60–0.93)
GFR, Est African American: 90 mL/min/{1.73_m2} (ref >=60)
GFR, Est Non African American: 77 mL/min/{1.73_m2} (ref >=60)
Globulin: 3.4 g/dL (calc) (ref 1.9–3.7)
Glucose, Bld: 89 mg/dL (ref 65–99)
Potassium: 4.3 mmol/L (ref 3.5–5.3)
Sodium: 138 mmol/L (ref 135–146)
Total Bilirubin: 0.7 mg/dL (ref 0.2–1.2)
Total Protein: 7.3 g/dL (ref 6.1–8.1)

## 2019-09-11 MED ORDER — GADOBUTROL 1 MMOL/ML IV SOLN
8.0000 mL | Freq: Once | INTRAVENOUS | Status: AC | PRN
  Administered 2019-09-11: 8 mL via INTRAVENOUS

## 2019-09-15 ENCOUNTER — Ambulatory Visit (INDEPENDENT_AMBULATORY_CARE_PROVIDER_SITE_OTHER): Payer: Medicare Other | Admitting: Gastroenterology

## 2019-09-15 ENCOUNTER — Other Ambulatory Visit (INDEPENDENT_AMBULATORY_CARE_PROVIDER_SITE_OTHER): Payer: Medicare Other

## 2019-09-15 ENCOUNTER — Encounter: Payer: Self-pay | Admitting: Gastroenterology

## 2019-09-15 VITALS — BP 110/60 | HR 62 | Temp 99.1°F | Ht <= 58 in | Wt 178.0 lb

## 2019-09-15 DIAGNOSIS — R188 Other ascites: Secondary | ICD-10-CM

## 2019-09-15 DIAGNOSIS — K7460 Unspecified cirrhosis of liver: Secondary | ICD-10-CM

## 2019-09-15 DIAGNOSIS — K7290 Hepatic failure, unspecified without coma: Secondary | ICD-10-CM

## 2019-09-15 DIAGNOSIS — I8500 Esophageal varices without bleeding: Secondary | ICD-10-CM | POA: Diagnosis not present

## 2019-09-15 DIAGNOSIS — K7682 Hepatic encephalopathy: Secondary | ICD-10-CM

## 2019-09-15 DIAGNOSIS — R932 Abnormal findings on diagnostic imaging of liver and biliary tract: Secondary | ICD-10-CM | POA: Diagnosis not present

## 2019-09-15 LAB — PROTIME-INR
INR: 1.1 ratio — ABNORMAL HIGH (ref 0.8–1.0)
Prothrombin Time: 12.7 s (ref 9.6–13.1)

## 2019-09-15 LAB — CREATININE, SERUM: Creatinine, Ser: 0.95 mg/dL (ref 0.40–1.20)

## 2019-09-15 MED ORDER — LACTULOSE 10 GM/15ML PO SOLN: 10.0000 g | Freq: Every day | ORAL | 3 refills | Status: DC

## 2019-09-15 NOTE — Patient Instructions (Addendum)
If you are age 75 or older, your body mass index should be between 23-30. Your Body mass index is 38.52 kg/m. If this is out of the aforementioned range listed, please consider follow up with your Primary Care Provider.  If you are age 72 or younger, your body mass index should be between 19-25. Your Body mass index is 38.52 kg/m. If this is out of the aformentioned range listed, please consider follow up with your Primary Care Provider.   To help prevent the possible spread of infection to our patients, communities, and staff; we will be implementing the following measures:  As of now we are not allowing any visitors/family members to accompany you to any upcoming appointments with [facility]. If you have any concerns about this please contact our office to discuss prior to the appointment.   We have sent the following medications to your pharmacy for you to pick up at your convenience: Lactulose 10gm/34m: Take 15 ml once daily  Please go to the lab in the basement of our building to have lab work done as you leave today. Hit "B" for basement when you get on the elevator.  When the doors open the lab is on your left.  We will call you with the results. Thank you.  You will be due for an office visit and an Ultrasound in 6 months.  We will remind you when it is time to schedule each appointment.  Thank you for entrusting me with your care and [facility]

## 2019-09-15 NOTE — Progress Notes (Signed)
HPI :  73 y/o female here for follow up for cirrhosis. 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 History of ascites, small varices, and hepatic encephalopathy. Prior liver biopsy done during time of cholecystectomy by Dr. Johney Maine, showed evidence of cirrhosis, some areas of "chronic inflammatory infiltrates with focal proliferating bile ducts", as well as Mallory bodies. She's had negative serologic workup for immune hepatitis, AMA negative.   She has been doing quite well since of last seen her.  She is taking Lasix 20 mg a day and Aldactone 50 mg a day and it is controlling her ascites and edema.  She has been on a low-dose of propranolol 20 mg once daily, limited by her low resting heart rate, for history of small esophageal varices.  She is tolerating the regimen well.  She has been taking lactulose only once daily for a long time, however has had no issues with encephalopathy recently.  She thinks her brother was recently diagnosed with liver disease but is unclear on the details of this.  She had an ultrasound done 07/22/2018 which showed a questionable mass in the right lobe. She subsequently had an MRI of the liver on September 17 2018. There was a 2.6 cm enhancing lesion in the lateral aspect of segment 7. Differential includes confluent hepatic fibrosis versus HCC. AFP was normal. Her case was presented at tumor board at the oncology center. Following discussion was recommended that she have follow-up imaging. She has had a subsequent MRI of the liver in March 2020 as well as September 2020, neither of those exam showed any liver mass, findings most consistent with confluent fibrosis.  No mass lesions  Prior endoscopic workup Colonoscopy - 10/13/2015 - Dr. Adolph Pollack at [facility] [facility]. - exam was a poor prep which was aborted in the left colon, told to repeat with 2 day prep. Colonoscopy - 10/20/2015 - Dr. Adolph Pollack - 3 ascending polyps 5-62m in  size, one small sigmoid polyp removed, diverticulosis - fair bowel prep - recommended 3 year follow up Colonoscopy 09/10/2018 - 8 small polyps - largest 874min size, diverticulosis, hemorrhoids - Repeat 3 years  2016 - EGD - small esophageal varices, mild PHG  MRI liver 03/03/19 - IMPRESSION: 1. Advanced cirrhotic changes in the liver with areas of confluent hepatic fibrosis in the periphery of the right lobe of the liver, similar to the prior study. No suspicious hypervascular lesion to suggest hepatocellular carcinoma noted at this time. 2. Aortic atherosclerosis.  MRI liver 09/11/19 - IMPRESSION: 1. Hepatic cirrhosis with fibrosis and architectural distortion. No liver mass identified 2. Splenorenal shunting indicating portal venous hypertension. 3. Descending and sigmoid colon diverticulosis. 4.  Aortic Atherosclerosis (ICD10-I70.0).    Past Medical History:  Diagnosis Date  . Abnormal EKG   . Abnormal finding on thyroid function test   . Abnormal liver function test   . Acute edema   . Alcoholic cirrhosis ([place]0791/47/8295 Possible NASH overlap (MELD 13)  . Anemia   . Anxiety   . Arthritis   . Ascites   . Back pain   . Breast mass   . Chronic low back pain   . Complete right rotator cuff tear   . Complication of anesthesia    during first surgery- woke up during surgery many years ago  . Compression fracture of L4 lumbar vertebra   . Dermatitis, eczematoid   . Difficulty breathing   . Gallstones   .  History of adenocarcinoma of breast   . Hypercholesterolemia   . Hyperkalemia   . Hypertension   . Hypokalemia   . Hypothyroidism   . Insomnia   . Knee pain   . Leukocytosis   . Lymphadenopathy   . Macrocytosis   . Memory loss or impairment   . Menopause   . MGUS (monoclonal gammopathy of unknown significance)   . Osteoarthritis   . Osteoarthritis of right knee   . Other cirrhosis of liver ([place])   . Renal insufficiency syndrome   . Right knee DJD   .  Solitary thyroid nodule   . Unspecified lump in the left breast, unspecified quadrant   . Vitamin B 12 deficiency   . 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 BREAST SURGERY  2014   Removed lymphnodes  . EYE SURGERY  2006   bilateral cataract  . JOINT REPLACEMENT     right  (2017)and left knee  . LAPAROSCOPIC CHOLECYSTECTOMY SINGLE SITE WITH INTRAOPERATIVE CHOLANGIOGRAM N/A 02/19/2018   Procedure: LAPAROSCOPIC CHOLECYSTECTOMY;  Surgeon: [name], MD;  Location: [facility];  Service: General;  Laterality: N/A;  . LESION REMOVAL  09/2015   tubular adenoma-4 subcentimeter lesions  . LIVER BIOPSY  02/19/2018   Procedure: LIVER BIOPSY;  Surgeon: [name], MD;  Location: [facility];  Service: General;;  . right thumb surgery    . TONSILLECTOMY    . TOTAL KNEE ARTHROPLASTY Right 09/05/2016  . UMBILICAL HERNIA REPAIR  02/19/2018   Procedure: REPAIR OF INCARCERATED UMBILICAL HERNIA;  Surgeon: [name], MD;  Location: [facility];  Service: General;;   Family History  Problem Relation Age of Onset  . Breast cancer Mother   . Colon cancer Neg Hx   . Stomach cancer Neg Hx   . Rectal cancer Neg Hx   . Liver cancer Neg Hx   . Esophageal cancer Neg Hx    Social History   Tobacco Use  . Smoking status: Never Smoker  . Smokeless tobacco: Never Used  Substance Use Topics  . Alcohol use: No    Comment: 6 or more per day in the past.   . Drug use: No   Current Outpatient Medications  Medication Sig Dispense Refill  . Cholecalciferol (VITAMIN D3) 2000 units TABS Take 1 tablet by mouth every morning.    . citalopram (CELEXA) 40 MG tablet TAKE ONE TABLET BY MOUTH DAILY 90 tablet 1  . furosemide (LASIX) 20 MG tablet TAKE ONE TABLET BY MOUTH EVERY MORNING 30 tablet 5  . gabapentin (NEURONTIN) 100 MG capsule Take 1 capsule (100 mg total) by mouth at bedtime. 90 capsule 3  . ketoconazole (NIZORAL) 2 % shampoo APPLY TO AFFECTED AREA(S) 2  TIMES PER WEEK 120 mL 2  . lactulose (CHRONULAC) 10 GM/15ML solution TAKE 15 ML'S BY MOUTH THREE TIMES A DAY 236 mL 3  . levothyroxine (SYNTHROID, LEVOTHROID) 75 MCG tablet Take 1 tablet (75 mcg total) by mouth daily before breakfast. 90 tablet 1  . Multiple Vitamin (MULTIVITAMIN) tablet Take 1 tablet by mouth daily.    . propranolol (INDERAL) 20 MG tablet TAKE ONE TABLET BY MOUTH EVERY MORNING 90 tablet 3  . spironolactone (ALDACTONE) 50 MG tablet Take 1 tablet (50 mg total) by mouth daily. Pharmacy-please d/c rx for spironolactone 100 mg dosage 30 tablet 1  . vitamin B-12 (CYANOCOBALAMIN) 1000 MCG tablet Take 1,000 mcg by mouth every morning.  No current facility-administered medications for this visit.    No Known Allergies   Review of Systems: All systems reviewed and negative except where noted in HPI.    Mr Liver W Wo Contrast  Result Date: 09/11/2019 CLINICAL DATA:  Hepatic cirrhosis and fibrosis surveillance. EXAM: MRI ABDOMEN WITHOUT AND WITH CONTRAST TECHNIQUE: Multiplanar multisequence MR imaging of the abdomen was performed both before and after the administration of intravenous contrast. CONTRAST:  25m GADAVIST GADOBUTROL 1 MMOL/ML IV SOLN COMPARISON:  03/03/2019 FINDINGS: Lower chest: Unremarkable Hepatobiliary: Hepatic cirrhosis and fibrosis with architectural distortion. No significant abnormal arterial phase enhancing lesion to favor hepatocellular carcinoma at this time. Cholecystectomy noted. No biliary dilatation. Pancreas:  Unremarkable Spleen:  Unremarkable Adrenals/Urinary Tract: Adrenal glands unremarkable. Simple 1.2 cm right kidney upper pole cyst. Stomach/Bowel: Descending and sigmoid colon diverticulosis. Vascular/Lymphatic: Splenorenal shunting indicating portal venous hypertension. Aortoiliac atherosclerotic vascular disease. Other:  No supplemental non-categorized findings. Musculoskeletal: T11 hemangioma. Lumbosacral spondylosis and degenerative disc disease.  Lower lumbar compression fractures. IMPRESSION: 1. Hepatic cirrhosis with fibrosis and architectural distortion. No liver mass identified 2. Splenorenal shunting indicating portal venous hypertension. 3. Descending and sigmoid colon diverticulosis. 4.  Aortic Atherosclerosis (ICD10-I70.0). Electronically Signed   By: WVan ClinesM.D.   On: 09/11/2019 09:04   Dg Bone Density  Result Date: 08/27/2019 EXAM: DUAL X-RAY ABSORPTIOMETRY (DXA) FOR BONE MINERAL DENSITY IMPRESSION: Referring Physician:  JLauree ChandlerYour patient completed a BMD test using Lunar IDXA DXA system ( analysis version: 16 ) manufactured by [employer] Technologist: AW PATIENT: Name: UTruly, StankiewiczPatient ID: [id number] Date: 110/02/1947Height: 57.0 in. Sex: Female Measured: 08/27/2019 Weight: 172.0 lbs. Indications: Advanced Age, Caucasian, Celexa, Cirrhosis of Liver, Depression, Estrogen Deficient, Gabapentin, Height Loss (781.91), Hypothyroid, Levothyroxine, Postmenopausal Fractures: Foot Treatments: Calcium (E943.0), Vitamin D (E933.5) ASSESSMENT: The BMD measured at Forearm Radius 33% is 0.707 g/cm2 with a T-score of -2.0. This patient is considered osteopenic according to [place]([facility] criteria. The scan quality is good. Lumbar spine was not utilized due to advanced degenerative changes. Site Region Measured Date Measured Age YA BMD Significant CHANGE T-score Left Forearm Radius 33% 08/27/2019 74.6 -2.0 0.707 g/cm2 DualFemur Neck Left 08/27/2019 74.6 -1.5 0.824 g/cm2 DualFemur Total Mean 08/27/2019 74.6 -0.5 0.946 g/cm2 World Health Organization ([facility] criteria for post-menopausal, Caucasian Women: Normal       T-score at or above -1 SD Osteopenia   T-score between -1 and -2.5 SD Osteoporosis T-score at or below -2.5 SD RECOMMENDATION: 1. All patients should optimize calcium and vitamin D intake. 2. Consider FDA approved medical therapies in postmenopausal women and men aged 51years and older, based  on the following: a. A hip or vertebral (clinical or morphometric) fracture b. T- score < or = -2.5 at the femoral neck or spine after appropriate evaluation to exclude secondary causes c. Low bone mass (T-score between -1.0 and -2.5 at the femoral neck or spine) and a 10 year probability of a hip fracture > or = 3% or a 10 year probability of a major osteoporosis-related fracture > or = 20% based on the [place]-adapted WHO algorithm d. Clinician judgment and/or patient preferences may indicate treatment for people with 10-year fracture probabilities above or below these levels FOLLOW-UP: Patients with diagnosis of osteoporosis or at high risk for fracture should have regular bone mineral density tests. For patients eligible for Medicare, routine testing is allowed once every 2 years. The testing frequency can be increased to one year for  patients who have rapidly progressing disease, those who are receiving or discontinuing medical therapy to restore bone mass, or have additional risk factors. FRAX* 10-year Probability of Fracture Based on femoral neck BMD: DualFemur (Left) Major Osteoporotic Fracture: 10.2% Hip Fracture:                1.9% Population:                  [place] (Caucasian) Risk Factors:                None *FRAX is a Materials engineer of the [employer] for Metabolic Bone Disease, a World Pharmacologist (WHO) [facility]. ASSESSMENT: The probability of a major osteoporotic fracture is 10.2 % within the next ten years. The probability of a hip fracture is 1.9 % within the next ten years. Electronically Signed   By: Marlaine Hind M.D.   On: 08/27/2019 11:50    Physical Exam: BP 110/60   Pulse 62   Temp 99.1 F (37.3 C)   Ht 4' 9"  (1.448 m)   Wt 178 lb (80.7 kg)   BMI 38.52 kg/m  Constitutional: Pleasant,well-developed, female in no acute distress. HEENT: Normocephalic and atraumatic. Conjunctivae are normal. No scleral icterus. Neck supple.   Cardiovascular: Normal rate, regular rhythm.  Pulmonary/chest: Effort normal and breath sounds normal. No wheezing, rales or rhonchi. Abdominal: Soft, nondistended, nontender.  There are no masses palpable. No hepatomegaly. Extremities: (+) 1 edema LLE, none on RLE - patient states stable and no change in findings over time Lymphadenopathy: No cervical adenopathy noted. Neurological: Alert and oriented to person place and time. Skin: Skin is warm and dry. No rashes noted. Psychiatric: Normal mood and affect. Behavior is normal.   ASSESSMENT AND PLAN: 73 year old female here for reassessment of the following:  Cirrhosis with ascites - generally doing very well on her present regimen.  Will continue present dosing of diuretics, recent labs show stable renal function / electrolytes.  Her [place] screening is up-to-date, last 2 MRIs show confluent fibrosis without any mass lesions which is reassuring.  She is due for AFP level and will send for that today as well as an INR.  She is up-to-date with her vaccinations.  She will continue to follow-up every 6 months, repeat liver ultrasound in 6 months as well as labs at that time.  She will contact me in the interim with any questions or changes in her status  Hepatic encephalopathy - maintained on low-dose lactulose and has had no issues with this.  She prefers to continue this dosing regimen, if any issues with this she will increase the dose, her son was present during the visit today and agree with the plan, will monitor mental status.  Esophageal varices - small varices noted in 2016, she is on low-dose propranolol given her low resting heart rate.  We did discuss whether or not she wanted endoscopy to reassess the varices and ensure no high risk lesions that would warrant banding given she cannot tolerate higher dose propranolol.  After discussion of risks and benefits of the procedure, she wants to avoid EGD if at all possible and declined the exam at  this time.  We will continue propranolol present dose.  Belpre Cellar, MD [facility]

## 2019-09-16 ENCOUNTER — Ambulatory Visit (HOSPITAL_COMMUNITY): Payer: Medicare Other

## 2019-09-16 LAB — AFP TUMOR MARKER: AFP-Tumor Marker: 2.8 ng/mL

## 2019-09-25 ENCOUNTER — Other Ambulatory Visit: Payer: Self-pay | Admitting: Family

## 2019-09-25 ENCOUNTER — Other Ambulatory Visit: Payer: Self-pay | Admitting: Gastroenterology

## 2019-09-27 ENCOUNTER — Other Ambulatory Visit: Payer: Self-pay | Admitting: Family

## 2019-10-16 ENCOUNTER — Other Ambulatory Visit: Payer: Self-pay | Admitting: Internal Medicine

## 2019-10-16 DIAGNOSIS — F339 Major depressive disorder, recurrent, unspecified: Secondary | ICD-10-CM

## 2019-12-14 LAB — HM MAMMOGRAPHY

## 2020-01-06 ENCOUNTER — Other Ambulatory Visit: Payer: Self-pay | Admitting: Internal Medicine

## 2020-01-06 DIAGNOSIS — F339 Major depressive disorder, recurrent, unspecified: Secondary | ICD-10-CM

## 2020-01-11 ENCOUNTER — Other Ambulatory Visit: Payer: Self-pay | Admitting: Internal Medicine

## 2020-01-12 NOTE — Telephone Encounter (Signed)
rx sent to pharmacy by e-script  

## 2020-01-15 ENCOUNTER — Other Ambulatory Visit: Payer: Self-pay | Admitting: Family

## 2020-01-15 ENCOUNTER — Other Ambulatory Visit: Payer: Self-pay | Admitting: Gastroenterology

## 2020-02-19 ENCOUNTER — Ambulatory Visit: Payer: Medicare Other | Attending: Internal Medicine

## 2020-02-19 DIAGNOSIS — Z23 Encounter for immunization: Secondary | ICD-10-CM | POA: Insufficient documentation

## 2020-02-19 NOTE — Progress Notes (Signed)
   Covid-19 Vaccination Clinic  Name:  Christina [name]    MRN: [id number] DOB: May 07, 1946  02/19/2020  [name] was observed post Covid-19 immunization for 15 minutes without incidence. She was provided with Vaccine Information Sheet and instruction to access the V-Safe system.   Ms. Shave was instructed to call 911 with any severe reactions post vaccine: Marland Kitchen Difficulty breathing  . Swelling of your face and throat  . A fast heartbeat  . A bad rash all over your body  . Dizziness and weakness    Immunizations Administered    Name Date Dose VIS Date Route   Pfizer COVID-19 Vaccine 02/19/2020 11:17 AM 0.3 mL 12/04/2019 Intramuscular   Manufacturer: [place]   Lot: [id number]   [place]: [id number]

## 2020-02-24 ENCOUNTER — Telehealth: Payer: Self-pay

## 2020-02-24 DIAGNOSIS — K7460 Unspecified cirrhosis of liver: Secondary | ICD-10-CM

## 2020-02-24 NOTE — Telephone Encounter (Signed)
Called and scheduled pt for U/S at [facility] on 03-09-20 at  10:30am to arrive at 10:15am, NPO 6 hours.  Called pt and spoke to her daughter.  Scheduled an OV with Armbrusterfor April 14th.

## 2020-02-24 NOTE — Telephone Encounter (Signed)
-----   Message from Roetta Sessions, [place] sent at 09/15/2019 10:49 AM EDT ----- Regarding: recall Ultrasound due in 02-2020 RUQ Ultrasound and OV due in March 2021 for [facility] screening

## 2020-03-07 ENCOUNTER — Encounter: Payer: Self-pay | Admitting: Internal Medicine

## 2020-03-07 ENCOUNTER — Telehealth: Payer: Self-pay | Admitting: Gastroenterology

## 2020-03-07 ENCOUNTER — Other Ambulatory Visit: Payer: Self-pay

## 2020-03-07 ENCOUNTER — Telehealth (INDEPENDENT_AMBULATORY_CARE_PROVIDER_SITE_OTHER): Payer: Medicare Other | Admitting: Internal Medicine

## 2020-03-07 DIAGNOSIS — K7031 Alcoholic cirrhosis of liver with ascites: Secondary | ICD-10-CM

## 2020-03-07 DIAGNOSIS — G603 Idiopathic progressive neuropathy: Secondary | ICD-10-CM | POA: Diagnosis not present

## 2020-03-07 DIAGNOSIS — M25511 Pain in right shoulder: Secondary | ICD-10-CM

## 2020-03-07 DIAGNOSIS — L219 Seborrheic dermatitis, unspecified: Secondary | ICD-10-CM | POA: Diagnosis not present

## 2020-03-07 DIAGNOSIS — C880 Waldenstrom macroglobulinemia: Secondary | ICD-10-CM | POA: Diagnosis not present

## 2020-03-07 DIAGNOSIS — E785 Hyperlipidemia, unspecified: Secondary | ICD-10-CM

## 2020-03-07 DIAGNOSIS — G8929 Other chronic pain: Secondary | ICD-10-CM | POA: Diagnosis not present

## 2020-03-07 DIAGNOSIS — F331 Major depressive disorder, recurrent, moderate: Secondary | ICD-10-CM | POA: Diagnosis not present

## 2020-03-07 DIAGNOSIS — Z6837 Body mass index (BMI) 37.0-37.9, adult: Secondary | ICD-10-CM | POA: Diagnosis not present

## 2020-03-07 DIAGNOSIS — E890 Postprocedural hypothyroidism: Secondary | ICD-10-CM

## 2020-03-07 MED ORDER — LACTULOSE 10 GM/15ML PO SOLN: 10.0000 g | Freq: Every day | ORAL | 1 refills | Status: DC

## 2020-03-07 NOTE — Patient Instructions (Signed)
Major Depressive Disorder, Adult Major depressive disorder (MDD) is a mental health condition. MDD often makes you feel sad, hopeless, or helpless. MDD can also cause symptoms in your body. MDD can affect your:  Work.  School.  Relationships.  Other normal activities. MDD can range from mild to very bad. It may occur once (single episode MDD). It can also occur many times (recurrent MDD). The main symptoms of MDD often include:  Feeling sad, depressed, or irritable most of the time.  Loss of interest. MDD symptoms also include:  Sleeping too much or too little.  Eating too much or too little.  A change in your weight.  Feeling tired (fatigue) or having low energy.  Feeling worthless.  Feeling guilty.  Trouble making decisions.  Trouble thinking clearly.  Thoughts of suicide or harming others.  Feeling weak.  Feeling agitated.  Keeping yourself from being around other people (isolation). Follow these instructions at home: Activity  Do these things as told by your doctor: ? Go back to your normal activities. ? Exercise regularly. ? Spend time outdoors. Alcohol  Talk with your doctor about how alcohol can affect your antidepressant medicines.  Do not drink alcohol. Or, limit how much alcohol you drink. ? This means no more than 1 drink a day for nonpregnant women and 2 drinks a day for men. One drink equals one of these:  12 oz of beer.  5 oz of wine.  1 oz of hard liquor. General instructions  Take over-the-counter and prescription medicines only as told by your doctor.  Eat a healthy diet.  Get plenty of sleep.  Find activities that you enjoy. Make time to do them.  Think about joining a support group. Your doctor may be able to suggest a group for you.  Keep all follow-up visits as told by your doctor. This is important. Where to find more information:  [employer] on Mental Illness: ? www.nami.[place]: ? https://carter.com/  National Suicide Prevention Lifeline: ? (780)286-5886. This is free, 24-hour help. Contact a doctor if:  Your symptoms get worse.  You have new symptoms. Get help right away if:  You self-harm.  You see, hear, taste, smell, or feel things that are not present (hallucinate). If you ever feel like you may hurt yourself or others, or have thoughts about taking your own life, get help right away. You can go to your nearest emergency department or call:  Your local emergency services (911 in the [place]  A suicide crisis helpline, such as the National Suicide Prevention Lifeline: ? 432-008-3590. This is open 24 hours a day. This information is not intended to replace advice given to you by your health care provider. Make sure you discuss any questions you have with your health care provider. Document Revised: 11/22/2017 Document Reviewed: 08/26/2016 Elsevier Patient Education  2020 [employer].

## 2020-03-07 NOTE — Telephone Encounter (Signed)
Pt has an upcoming appointment with Dr. Havery Moros. At last visit she was told to continue lactulose.

## 2020-03-07 NOTE — Progress Notes (Signed)
Patient ID: Christina [name], female   DOB: 1946/06/22, 74 y.o.   MRN: [id number] This service is provided via telemedicine  No vital signs collected/recorded due to the encounter was a telemedicine visit.   Location of patient (ex: home, work):  home  Patient consents to a telephone visit: yes  Location of the provider (ex: office, home): [place]  Name of any referring provider: Hollace Kinnier DO Names of all persons participating in the telemedicine service and their role in the encounter:Crimson Dubberly Mariea Clonts DO, Charlene CMA, and patient  Time spent on call:  10  Virtual Visit via Video Note  I connected with Christina [name] on 03/07/20 at  9:30 AM EDT by a video enabled telemedicine application and verified that I am speaking with the correct person using two identifiers.   I discussed the limitations of evaluation and management by telemedicine and the availability of in person appointments. The patient expressed understanding and agreed to proceed.  History of Present Illness: 74 yo female with h/o alcoholic cirrhosis, waldenstrom's macroglobulinemia, depression, b12 deficiency, peripheral neuropathy (mixed b12 and waldenstrom's), scalp seb derm, hypothyroidism, and vitamin D deficiency seen via virtual mychart video visit.  Christina [name] is doing "good healthwise".  Admits, however, that her depression has been acting up again.  She is still grieving the loss of her husband who passed away in sept 2.5 yrs ago.  She's not been wanting to do anything in the past 2 wks, just sitting around at home.  She says she's had a lot of little projects to do but not doing them.  It's been boring with covid.  Doesn't see anybody and is reminded she is alone.  She does not want her medications changed.  We discussed getting out and walking again with her cane both for her spirits and her joints and getting some sunshine to brighten her spirits.  She agrees with this approach and will call if she's not improving.  No  shortness of breath or chest pain. Says her arthritis in her shoulder is still bad.  She reports still "walking like a duck".   Neuropathy in her hands and feet persists.  Was talking to her neighbor maybe 10 mins and her feet fell asleep standing.  Does use her cane for stability due to this.   She sees Dr. Havery Moros next week and has an abdominal ultrasound 3/17 for annual check.  No GI complaints.     Observations/Objective: Alert and oriented x 3 Disheveled a bit--was straightening her hair Normal skin tone No distress  Assessment and Plan: 1. Depression, major, recurrent, moderate ([place]) -had been in remission but worse the last two weeks -cont celexa -encouraged walking outside and just getting outside in the sun a bit -may improve when she's vaccinated and can socialize a little more  2. Alcoholic cirrhosis of liver with ascites ([place]) -keep f/u with Dr. Havery Moros as planned and US--I'll follow along  3. Waldenstrom's macroglobulinemia ([place]) -last blood counts wnl:   Lab Results  Component Value Date   WBC 6.8 04/30/2019   HGB 12.1 04/30/2019   HCT 36.3 04/30/2019   MCV 91.4 04/30/2019   PLT 191 04/30/2019  -has not been into office due to covid--agrees she'll come in after vaccination  4. Postoperative hypothyroidism -cont current levothyroxine -recheck tsh next time Lab Results  Component Value Date   TSH 2.02 04/02/2019   5. Idiopathic progressive neuropathy -continue gabapentin and B12, use of cane at all times for balance  6. Chronic  right shoulder pain -encouraged regular exercise to prevent further worsening  7. Seborrheic dermatitis of scalp -cont ketoconazole shampoo  Follow Up Instructions:  F/u in 6 mos in person, come fasting for labs  I discussed the assessment and treatment plan with the patient. The patient was provided an opportunity to ask questions and all were answered. The patient agreed with the plan and demonstrated an understanding of  the instructions.   The patient was advised to call back or seek an in-person evaluation if the symptoms worsen or if the condition fails to improve as anticipated.  I provided 27 minutes of non-face-to-face time during this encounter.  Rollo Farquhar L. Kemoni Quesenberry, D.O. [facility] [address], [place] [address] Cell Phone (Mon-Fri 8am-5pm):  [phone] On Call:  [phone] & follow prompts after 5pm & weekends Office Phone:  [phone] Office Fax:  [phone]

## 2020-03-07 NOTE — Telephone Encounter (Signed)
Pt requested a refill for Lactulose sent to [facility] on [address].

## 2020-03-09 ENCOUNTER — Other Ambulatory Visit: Payer: Self-pay

## 2020-03-09 ENCOUNTER — Ambulatory Visit (HOSPITAL_COMMUNITY)
Admission: RE | Admit: 2020-03-09 | Discharge: 2020-03-09 | Disposition: A | Discharge status: Home / Self Care | Payer: Medicare Other | Source: Ambulatory Visit | Attending: Gastroenterology | Admitting: Gastroenterology

## 2020-03-09 DIAGNOSIS — R188 Other ascites: Secondary | ICD-10-CM | POA: Diagnosis not present

## 2020-03-09 DIAGNOSIS — K7460 Unspecified cirrhosis of liver: Secondary | ICD-10-CM | POA: Diagnosis not present

## 2020-03-10 ENCOUNTER — Other Ambulatory Visit: Payer: Self-pay

## 2020-03-10 DIAGNOSIS — K7682 Hepatic encephalopathy: Secondary | ICD-10-CM

## 2020-03-10 DIAGNOSIS — K7290 Hepatic failure, unspecified without coma: Secondary | ICD-10-CM

## 2020-03-10 DIAGNOSIS — K7460 Unspecified cirrhosis of liver: Secondary | ICD-10-CM

## 2020-03-16 ENCOUNTER — Ambulatory Visit: Payer: Medicare Other | Attending: Internal Medicine

## 2020-03-16 DIAGNOSIS — Z23 Encounter for immunization: Secondary | ICD-10-CM

## 2020-03-16 NOTE — Progress Notes (Signed)
   Covid-19 Vaccination Clinic  Name:  Christina [name]    MRN: [id number] DOB: [birth year]  03/16/2020  [name] was observed post Covid-19 immunization for 15 minutes without incident. She was provided with Vaccine Information Sheet and instruction to access the V-Safe system.   [name] was instructed to call 911 with any severe reactions post vaccine: Marland Kitchen Difficulty breathing  . Swelling of face and throat  . A fast heartbeat  . A bad rash all over body  . Dizziness and weakness   Immunizations Administered    Name Date Dose VIS Date Route   Pfizer COVID-19 Vaccine 03/16/2020 10:44 AM 0.3 mL 12/04/2019 Intramuscular   Manufacturer: [place]   Lot: [id number]   [place]: [id number]

## 2020-03-23 ENCOUNTER — Telehealth: Payer: Self-pay

## 2020-03-23 NOTE — Telephone Encounter (Signed)
Spoke to pt's daughter on 3/18 and informed her that she is due for labs.  She indicated she would take her but as of today, 3-31 she has not gone. Called and Left a message that she needs lab before appt on 4-14 with Dr. Havery Moros. There are duplicate labs in there for Dr. Mariea Clonts so she should only have them draw those once (cbc and cmet).

## 2020-03-30 ENCOUNTER — Other Ambulatory Visit (INDEPENDENT_AMBULATORY_CARE_PROVIDER_SITE_OTHER): Payer: Medicare Other

## 2020-03-30 DIAGNOSIS — K7682 Hepatic encephalopathy: Secondary | ICD-10-CM

## 2020-03-30 DIAGNOSIS — K7460 Unspecified cirrhosis of liver: Secondary | ICD-10-CM | POA: Diagnosis not present

## 2020-03-30 DIAGNOSIS — K7290 Hepatic failure, unspecified without coma: Secondary | ICD-10-CM | POA: Diagnosis not present

## 2020-03-30 DIAGNOSIS — R188 Other ascites: Secondary | ICD-10-CM

## 2020-03-30 LAB — CBC WITH DIFFERENTIAL/PLATELET
Basophils Absolute: 0 10*3/uL (ref 0.0–0.1)
Basophils Relative: 0.4 % (ref 0.0–3.0)
Eosinophils Absolute: 0.2 10*3/uL (ref 0.0–0.7)
Eosinophils Relative: 2.3 % (ref 0.0–5.0)
HCT: 35.7 % — ABNORMAL LOW (ref 36.0–46.0)
Hemoglobin: 12.4 g/dL (ref 12.0–15.0)
Lymphocytes Relative: 21 % (ref 12.0–46.0)
Lymphs Abs: 1.6 10*3/uL (ref 0.7–4.0)
MCHC: 34.7 g/dL (ref 30.0–36.0)
MCV: 93.4 fl (ref 78.0–100.0)
Monocytes Absolute: 1 10*3/uL (ref 0.1–1.0)
Monocytes Relative: 12.5 % — ABNORMAL HIGH (ref 3.0–12.0)
Neutro Abs: 4.9 10*3/uL (ref 1.4–7.7)
Neutrophils Relative %: 63.8 % (ref 43.0–77.0)
Platelets: 195 10*3/uL (ref 150.0–400.0)
RBC: 3.82 Mil/uL — ABNORMAL LOW (ref 3.87–5.11)
RDW: 12.9 % (ref 11.5–15.5)
WBC: 7.7 10*3/uL (ref 4.0–10.5)

## 2020-03-30 LAB — COMPREHENSIVE METABOLIC PANEL
ALT: 16 U/L (ref 0–35)
AST: 21 U/L (ref 0–37)
Albumin: 3.9 g/dL (ref 3.5–5.2)
Alkaline Phosphatase: 65 U/L (ref 39–117)
BUN: 24 mg/dL — ABNORMAL HIGH (ref 6–23)
CO2: 31 mEq/L (ref 19–32)
Calcium: 9.4 mg/dL (ref 8.4–10.5)
Chloride: 100 mEq/L (ref 96–112)
Creatinine, Ser: 0.88 mg/dL (ref 0.40–1.20)
GFR: 62.6 mL/min (ref >60.00)
Glucose, Bld: 97 mg/dL (ref 70–99)
Potassium: 4.4 mEq/L (ref 3.5–5.1)
Sodium: 138 mEq/L (ref 135–145)
Total Bilirubin: 0.5 mg/dL (ref 0.2–1.2)
Total Protein: 7.5 g/dL (ref 6.0–8.3)

## 2020-03-30 LAB — PROTIME-INR
INR: 1 ratio (ref 0.8–1.0)
Prothrombin Time: 11.3 s (ref 9.6–13.1)

## 2020-03-31 LAB — AFP TUMOR MARKER: AFP-Tumor Marker: 3.3 ng/mL

## 2020-04-06 ENCOUNTER — Ambulatory Visit (INDEPENDENT_AMBULATORY_CARE_PROVIDER_SITE_OTHER): Payer: Medicare Other | Admitting: Gastroenterology

## 2020-04-06 ENCOUNTER — Encounter: Payer: Self-pay | Admitting: Gastroenterology

## 2020-04-06 VITALS — BP 126/60 | HR 63 | Temp 97.4°F | Ht <= 58 in | Wt 180.0 lb

## 2020-04-06 DIAGNOSIS — K7031 Alcoholic cirrhosis of liver with ascites: Secondary | ICD-10-CM

## 2020-04-06 DIAGNOSIS — K7682 Hepatic encephalopathy: Secondary | ICD-10-CM

## 2020-04-06 DIAGNOSIS — I8500 Esophageal varices without bleeding: Secondary | ICD-10-CM

## 2020-04-06 DIAGNOSIS — K7290 Hepatic failure, unspecified without coma: Secondary | ICD-10-CM

## 2020-04-06 MED ORDER — LACTULOSE 10 GM/15ML PO SOLN: 10.0000 g | Freq: Every day | ORAL | 11 refills | Status: DC

## 2020-04-06 NOTE — Progress Notes (Signed)
HPI :  73y/o female here for follow up for cirrhosis. 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 History of ascites, small varices, and hepatic encephalopathy. Prior liver biopsy done during time of cholecystectomy by Dr. Johney Maine, showed evidence of cirrhosis, some areas of "chronic inflammatory infiltrates with focal proliferating bile ducts", as well as Mallory bodies. She's had negative serologic workup for immune hepatitis,AMA negative.  She continues to do quite well since have last seen her.  She had the Covid vaccine and the rest of her immunizations are up-to-date.  She feels quite well.  She does not like taking the lactulose, as it causes her to have gas and bloating which she does not like.  She typically has a few bowel movements a day and has been taking lactulose once a day for a long time.  She has not had any issues with hepatic encephalopathy since have been following her, however was placed on this by previous physicians in [place] for issues at that time.  She has been compliant with her Aldactone and Lasix at low-dose and this generally keeps ascites at bay and fluid status stable.  She has occasional swelling in her hands and legs but this not frequent.  She has been taking propranolol 20 mg a day for esophageal varices.  Her resting heart rates around 60 and has not had a higher dose of this.  She had small esophageal varices on prior EGD.  She does admit to an occasional glass of wine in a social setting but does not drink routinely.  We discussed this.  She has had labs recently which looked okay.  Most recently had [place] screening with an ultrasound in March 2021 which looked stable.  Historically she has had some abnormalities on imaging which is led to MRIs and she has confluent hepatic fibrosis, benign changes.  Prior endoscopic  workup Colonoscopy -10/13/2015 - Dr. Adolph Pollack at [facility] [place] [facility]. - exam was a poor prep which was aborted in the left colon, told to repeat with 2 day prep. Colonoscopy -10/20/2015 - Dr. Adolph Pollack - 3 ascending polyps 5-44m in size, one small sigmoid polyp removed, diverticulosis - fair bowel prep - recommended 3 year follow up Colonoscopy 09/10/2018 -8 small polyps - largest 832min size, diverticulosis, hemorrhoids - Repeat 3 years  2016 - EGD - small esophageal varices, mild PHG  MRI liver 03/03/19 - IMPRESSION: 1. Advanced cirrhotic changes in the liver with areas of confluent hepatic fibrosis in the periphery of the right lobe of the liver, similar to the prior study. No suspicious hypervascular lesion to suggest hepatocellular carcinoma noted at this time. 2. Aortic atherosclerosis.  MRI liver 09/11/19 - IMPRESSION: 1. Hepatic cirrhosis with fibrosis and architectural distortion. No liver mass identified 2. Splenorenal shunting indicating portal venous hypertension. 3. Descending and sigmoid colon diverticulosis. 4. Aortic Atherosclerosis (ICD10-I70.0).  RUQ 03/09/20: IMPRESSION: Cirrhosis.  No liver mass   Past Medical History:  Diagnosis Date  . Abnormal EKG   . Abnormal finding on thyroid function test   . Abnormal liver function test   . Acute edema   . 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 Anemia   . Anxiety   . Arthritis   . Ascites   . Back pain   . Breast mass   . Chronic low back pain   .  Complete right rotator cuff tear   . Complication of anesthesia    during first surgery- woke up during surgery many years ago  . Compression fracture of L4 lumbar vertebra   . Dermatitis, eczematoid   . Difficulty breathing   . Gallstones   . History of adenocarcinoma of breast   . Hypercholesterolemia   . Hyperkalemia   . Hypertension   . Hypokalemia   . Hypothyroidism   . Insomnia   . Knee pain   .  Leukocytosis   . Lymphadenopathy   . Macrocytosis   . Memory loss or impairment   . Menopause   . MGUS (monoclonal gammopathy of unknown significance)   . Osteoarthritis   . Osteoarthritis of right knee   . Other cirrhosis of liver ([place])   . Renal insufficiency syndrome   . Right knee DJD   . Solitary thyroid nodule   . Unspecified lump in the left breast, unspecified quadrant   . Vitamin B 12 deficiency   . 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[place])      Past Surgical History:  Procedure Laterality Date  . BREAST SURGERY  2014   Removed lymphnodes  . EYE SURGERY  2006   bilateral cataract  . JOINT REPLACEMENT     right  (2017)and left knee  . LAPAROSCOPIC CHOLECYSTECTOMY SINGLE SITE WITH INTRAOPERATIVE CHOLANGIOGRAM N/A 02/19/2018   Procedure: LAPAROSCOPIC CHOLECYSTECTOMY;  Surgeon: [name], MD;  Location: [facility];  Service: General;  Laterality: N/A;  . LESION REMOVAL  09/2015   tubular adenoma-4 subcentimeter lesions  . LIVER BIOPSY  02/19/2018   Procedure: LIVER BIOPSY;  Surgeon: [name], MD;  [facility];  Service: General;;  . right thumb surgery    . TONSILLECTOMY    . TOTAL KNEE ARTHROPLASTY Right 09/05/2016  . UMBILICAL HERNIA REPAIR  02/19/2018   Procedure: REPAIR OF INCARCERATED UMBILICAL HERNIA;  Surgeon: [name], MD;  [facility];  Service: General;;   Family History  Problem Relation Age of Onset  . Breast cancer Mother   . Colon cancer Neg Hx   . Stomach cancer Neg Hx   . Rectal cancer Neg Hx   . Liver cancer Neg Hx   . Esophageal cancer Neg Hx    Social History   Tobacco Use  . Smoking status: Never Smoker  . Smokeless tobacco: Never Used  Substance Use Topics  . Alcohol use: No    Comment: 6 or more per day in the past.   . Drug use: No   Current Outpatient Medications  Medication Sig Dispense Refill  . Cholecalciferol (VITAMIN D3) 2000 units TABS Take 1 tablet by mouth every morning.    . citalopram (CELEXA) 40 MG tablet  TAKE ONE TABLET BY MOUTH DAILY 90 tablet 3  . furosemide (LASIX) 20 MG tablet TAKE ONE TABLET BY MOUTH EVERY MORNING 90 tablet 0  . gabapentin (NEURONTIN) 100 MG capsule TAKE ONE CAPSULE BY MOUTH EVERY NIGHT AT BEDTIME 90 capsule 2  . ketoconazole (NIZORAL) 2 % shampoo APPLY TO AFFECTED AREA(S) 2 TIMES PER WEEK 120 mL 2  . lactulose (CHRONULAC) 10 GM/15ML solution Take 15 mLs (10 g total) by mouth daily. (Patient taking differently: Take 10 g by mouth daily. Using once or twice daily) 1892 mL 1  . levothyroxine (SYNTHROID) 75 MCG tablet TAKE ONE TABLET BY MOUTH EVERY MORNING BEFORE BREAKFAST 90 tablet 0  . Multiple Vitamin (MULTIVITAMIN) tablet Take 1 tablet by mouth daily.    . propranolol (INDERAL)  20 MG tablet TAKE ONE TABLET BY MOUTH EVERY MORNING 90 tablet 3  . spironolactone (ALDACTONE) 50 MG tablet TAKE ONE TABLET BY MOUTH DAILY 90 tablet 0  . vitamin B-12 (CYANOCOBALAMIN) 1000 MCG tablet Take 1,000 mcg by mouth every morning.     No current facility-administered medications for this visit.   No Known Allergies   Review of Systems: All systems reviewed and negative except where noted in HPI.    US Abdomen Limited RUQ  Result Date: 03/09/2020 CLINICAL DATA:  Cirrhosis.  Liver screening. EXAM: ULTRASOUND ABDOMEN LIMITED RIGHT UPPER QUADRANT COMPARISON:  09/11/2019 MRI abdomen.  07/22/2018 abdominal sonogram. FINDINGS: Gallbladder: Cholecystectomy Common bile duct: Diameter: 4 mm Liver: Diffusely coarsened liver parenchymal echotexture with irregular liver surface, compatible with cirrhosis. No liver mass detected. Portal vein is patent on color Doppler imaging with normal direction of blood flow towards the liver. Other: None. IMPRESSION: Cirrhosis.  No liver mass. Electronically Signed   By: Ilona Sorrel M.D.   On: 03/09/2020 15:20   Lab Results  Component Value Date   WBC 7.7 03/30/2020   HGB 12.4 03/30/2020   HCT 35.7 (L) 03/30/2020   MCV 93.4 03/30/2020   PLT 195.0 03/30/2020     Lab Results  Component Value Date   CREATININE 0.88 03/30/2020   BUN 24 (H) 03/30/2020   NA 138 03/30/2020   K 4.4 03/30/2020   CL 100 03/30/2020   CO2 31 03/30/2020    Lab Results  Component Value Date   INR 1.0 03/30/2020   INR 1.1 (H) 09/15/2019   INR 1.1 (H) 02/17/2019   Lab Results  Component Value Date   ALT 16 03/30/2020   AST 21 03/30/2020   ALKPHOS 65 03/30/2020   BILITOT 0.5 03/30/2020     Physical Exam: BP 126/60   Pulse 63   Temp (!) 97.4 F (36.3 C)   Ht 4' 10"  (1.473 m)   Wt 180 lb (81.6 kg)   BMI 37.62 kg/m  Constitutional: Pleasant,well-developed, female in no acute distress. Abdominal: Soft, nondistended, nontender.  There are no masses palpable. No hepatomegaly. Extremities: no edema Lymphadenopathy: No cervical adenopathy noted. Neurological: Alert and oriented to person place and time. Skin: Skin is warm and dry. No rashes noted. Psychiatric: Normal mood and affect. Behavior is normal.   ASSESSMENT AND PLAN: 74 year old female here for reassessment of the following:  Cirrhosis with history of ascites - she has been pretty compensated since we have been following her on her last few visits.  The diuretics are working well and keeping ascites at [place].  Her [place] screening is up-to-date and she does not have any concerning lesions, her AFP is normal.  Her INR is normal and LFTs are normal right now.  This is all reassuring.  She does endorse occasional wine drinking and I outlined to her that I would avoid all alcohol moving forward given it could increase her risk for decompensation.  She understood.  I will continue to see her every 6 months and repeat labs at that time.  She agreed  Hepatic encephalopathy - well controlled, using lactulose once daily, having problems with gas and bloating from this but has been on it for a while and wishes to continue.  I have not noticed any issues with this for her since have been following her, however she  states has been on this since her time in [place] for issues in the past.  She wishes to continue for now.  Esophageal  varices - small, noted on EGD in 2016 and has been on low dose propranolol given her low resting heart rate.  She has wanted to avoid surveillance endoscopy based on prior discussions.  We will continue present regimen  [place] Cellar, MD [facility]

## 2020-04-06 NOTE — Patient Instructions (Addendum)
If you are age 76 or older, your body mass index should be between 23-30. Your Body mass index is 37.62 kg/m. If this is out of the aforementioned range listed, please consider follow up with your Primary Care Provider.  If you are age 11 or younger, your body mass index should be between 19-25. Your Body mass index is 37.62 kg/m. If this is out of the aformentioned range listed, please consider follow up with your Primary Care Provider.   You will be due for an Ultrasound and labs in Sept 2021.  We have sent the following medications to your pharmacy for you to pick up at your convenience: Lactulose  We would like to see you for a follow up appointment in October.  We will contact you to remind you when it is time to schedule.   Thank you for entrusting me with your care and for choosing [facility], Dr.  Cellar

## 2020-04-11 ENCOUNTER — Other Ambulatory Visit: Payer: Self-pay | Admitting: Internal Medicine

## 2020-04-14 ENCOUNTER — Other Ambulatory Visit: Payer: Self-pay | Admitting: Gastroenterology

## 2020-05-05 ENCOUNTER — Other Ambulatory Visit: Payer: Self-pay

## 2020-05-05 ENCOUNTER — Ambulatory Visit (INDEPENDENT_AMBULATORY_CARE_PROVIDER_SITE_OTHER): Payer: Medicare Other | Admitting: Nurse Practitioner

## 2020-05-05 ENCOUNTER — Encounter: Payer: Self-pay | Admitting: Nurse Practitioner

## 2020-05-05 ENCOUNTER — Telehealth: Payer: Self-pay

## 2020-05-05 DIAGNOSIS — Z0000 Encounter for general adult medical examination without abnormal findings: Secondary | ICD-10-CM

## 2020-05-05 NOTE — Progress Notes (Signed)
Subjective:   Christina [name] is a 74 y.o. female who presents for Medicare Annual (Subsequent) preventive examination.  Review of Systems:   Cardiac Risk Factors include: obesity (BMI >30kg/m2)     Objective:     Vitals: There were no vitals taken for this visit.  There is no height or weight on file to calculate BMI.  Advanced Directives 05/05/2020 09/10/2019 05/07/2019 05/04/2019 06/23/2018 03/19/2018 02/19/2018  Does Patient Have a Medical Advance Directive? Yes Yes Yes Yes Yes Yes Yes  Type of Advance Directive Healthcare Power of [place] of facility DNR (pink MOST or yellow form) Out of facility DNR (pink MOST or yellow form) Out of facility DNR (pink MOST or yellow form) Out of facility DNR (pink MOST or yellow form) Out of facility DNR (pink MOST or yellow form);[place];Living will [place];Living will  Does patient want to make changes to medical advance directive? No - Patient declined No - Patient declined No - Patient declined No - Patient declined No - Patient declined No - Patient declined No - Patient declined  Copy of [place] in Chart? No - copy requested - - - - No - copy requested No - copy requested  Pre-existing out of facility DNR order (yellow form or pink MOST form) - Pink MOST form placed in chart (order not valid for inpatient use) Yellow form placed in chart (order not valid for inpatient use);Pink MOST form placed in chart (order not valid for inpatient use) - Yellow form placed in chart (order not valid for inpatient use);Pink MOST form placed in chart (order not valid for inpatient use) Yellow form placed in chart (order not valid for inpatient use) -    Tobacco Social History   Tobacco Use  Smoking Status Never Smoker  Smokeless Tobacco Never Used     Counseling given: Not Answered   Clinical Intake:  Pre-visit preparation completed: Yes  Pain : 0-10 Pain Score: 2  Pain Type: Chronic  pain Pain Location: Hand Pain Orientation: Left, Right Pain Descriptors / Indicators: Aching Pain Onset: More than a month ago Pain Frequency: Intermittent Pain Relieving Factors: pain medicaiton helps.  Pain Relieving Factors: pain medicaiton helps.  BMI - recorded: 37.62 Nutritional Status: BMI > 30  Obese Nutritional Risks: None Diabetes: No  How often do you need to have someone help you when you read instructions, pamphlets, or other written materials from your doctor or pharmacy?: 1 - Never        Past Medical History:  Diagnosis Date  . Abnormal EKG   . Abnormal finding on thyroid function test   . Abnormal liver function test   . Acute edema   . Alcoholic cirrhosis ([place]) [id number]   Possible NASH overlap (MELD 13)  . Anemia   . Anxiety   . Arthritis   . Ascites   . Back pain   . Breast mass   . Chronic low back pain   . Complete right rotator cuff tear   . Complication of anesthesia    during first surgery- woke up during surgery many years ago  . Compression fracture of L4 lumbar vertebra   . Dermatitis, eczematoid   . Difficulty breathing   . Gallstones   . History of adenocarcinoma of breast   . Hypercholesterolemia   . Hyperkalemia   . Hypertension   . Hypokalemia   . Hypothyroidism   . Insomnia   . Knee pain   .  Leukocytosis   . Lymphadenopathy   . Macrocytosis   . Memory loss or impairment   . Menopause   . MGUS (monoclonal gammopathy of unknown significance)   . Osteoarthritis   . Osteoarthritis of right knee   . Other cirrhosis of liver ([place])   . Renal insufficiency syndrome   . Right knee DJD   . Solitary thyroid nodule   . Unspecified lump in the left breast, unspecified quadrant   . Vitamin B 12 deficiency   . 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[place])    Past Surgical History:  Procedure Laterality Date  . BREAST SURGERY  2014   Removed lymphnodes  . EYE SURGERY  2006   bilateral cataract  . JOINT REPLACEMENT     right  (2017)and  left knee  . LAPAROSCOPIC CHOLECYSTECTOMY SINGLE SITE WITH INTRAOPERATIVE CHOLANGIOGRAM N/A 02/19/2018   Procedure: LAPAROSCOPIC CHOLECYSTECTOMY;  Surgeon: [name], MD;  [facility];  Service: General;  Laterality: N/A;  . LESION REMOVAL  09/2015   tubular adenoma-4 subcentimeter lesions  . LIVER BIOPSY  02/19/2018   Procedure: LIVER BIOPSY;  Surgeon: [name], MD;  [facility];  Service: General;;  . right thumb surgery    . TONSILLECTOMY    . TOTAL KNEE ARTHROPLASTY Right 09/05/2016  . UMBILICAL HERNIA REPAIR  02/19/2018   Procedure: REPAIR OF INCARCERATED UMBILICAL HERNIA;  Surgeon: [name], MD;  [facility];  Service: General;;   Family History  Problem Relation Age of Onset  . Breast cancer Mother   . Colon cancer Neg Hx   . Stomach cancer Neg Hx   . Rectal cancer Neg Hx   . Liver cancer Neg Hx   . Esophageal cancer Neg Hx    Social History   Socioeconomic History  . Marital status: Widowed    Spouse name: Not on file  . Number of children: 6  . Years of education: Not on file  . Highest education level: Not on file  Occupational History  . Occupation: retired  Tobacco Use  . Smoking status: Never Smoker  . Smokeless tobacco: Never Used  Substance and Sexual Activity  . Alcohol use: No    Comment: 6 or more per day in the past.   . Drug use: No  . Sexual activity: Not Currently  Other Topics Concern  . Not on file  Social History Narrative   Social History       Diet? Regular      Do you drink/eat things with caffeine? 1 cup      Marital status?                        married            What year were you married?      Do you live in a house, apartment, assisted living, condo, trailer, etc.? townhouse      Is it one or more stories? no      How many persons live in your home? two      Do you have any pets in your home? (please list) no      Highest level of education completed? High school      Current or past profession:  realtor      Do you exercise?      no  Type & how often?      Advanced Directives      Do you have a living will? yes      Do you have a DNR form?      yes                            If not, do you want to discuss one?      Do you have signed POA/HPOA for forms? yes      Functional Status      Do you have difficulty bathing or dressing yourself? no      Do you have difficulty preparing food or eating? no      Do you have difficulty managing your medications? no      Do you have difficulty managing your finances? no      Do you have difficulty affording your medications? no   Social Determinants of Health   Financial Resource Strain:   . Difficulty of Paying Living Expenses:   Food Insecurity:   . Worried About Charity fundraiser in the Last Year:   . Arboriculturist in the Last Year:   Transportation Needs:   . Film/video editor (Medical):   Marland Kitchen Lack of Transportation (Non-Medical):   Physical Activity:   . Days of Exercise per Week:   . Minutes of Exercise per Session:   Stress:   . Feeling of Stress :   Social Connections:   . Frequency of Communication with Friends and Family:   . Frequency of Social Gatherings with Friends and Family:   . Attends Religious Services:   . Active Member of Clubs or Organizations:   . Attends Archivist Meetings:   Marland Kitchen Marital Status:     Outpatient Encounter Medications as of 05/05/2020  Medication Sig  . Cholecalciferol (VITAMIN D3) 2000 units TABS Take 1 tablet by mouth every morning.  . citalopram (CELEXA) 40 MG tablet TAKE ONE TABLET BY MOUTH DAILY  . furosemide (LASIX) 20 MG tablet TAKE ONE TABLET BY MOUTH EVERY MORNING  . gabapentin (NEURONTIN) 100 MG capsule TAKE ONE CAPSULE BY MOUTH EVERY NIGHT AT BEDTIME  . ketoconazole (NIZORAL) 2 % shampoo APPLY TO AFFECTED AREA(S) 2 TIMES PER WEEK  . lactulose (CHRONULAC) 10 GM/15ML solution Take 15 mLs (10 g total) by mouth daily. Take once  or twice daily as needed  . levothyroxine (SYNTHROID) 75 MCG tablet TAKE ONE TABLET BY MOUTH EVERY MORNING BEFORE BREAKFAST  . Multiple Vitamin (MULTIVITAMIN) tablet Take 1 tablet by mouth daily.  . propranolol (INDERAL) 20 MG tablet TAKE ONE TABLET BY MOUTH EVERY MORNING  . spironolactone (ALDACTONE) 50 MG tablet TAKE ONE TABLET BY MOUTH DAILY  . vitamin B-12 (CYANOCOBALAMIN) 1000 MCG tablet Take 1,000 mcg by mouth every morning.   No facility-administered encounter medications on file as of 05/05/2020.    Activities of Daily Living In your present state of health, do you have any difficulty performing the following activities: 05/05/2020  Hearing? N  Vision? N  Difficulty concentrating or making decisions? N  Walking or climbing stairs? Y  Comment difficulty climbing stairs  Dressing or bathing? N  Doing errands, shopping? N  Preparing Food and eating ? N  Using the Toilet? N  In the past six months, have you accidently leaked urine? N  Do you have problems with loss of bowel control? N  Managing your Medications? N  Managing  your Finances? N  Housekeeping or managing your Housekeeping? N  Some recent data might be hidden    Patient Care Team: Gayland Curry, DO as PCP - General (Geriatric Medicine) Armbruster, [name], MD as Consulting Physician (Gastroenterology) [name], MD as Consulting Physician (General Surgery)    Assessment:   This is a routine wellness examination for Anyeli.  Exercise Activities and Dietary recommendations Current Exercise Habits: The patient does not participate in regular exercise at present  Goals    . Patient Stated     To maintain current level of health       Fall Risk Fall Risk  05/05/2020 05/04/2019 02/05/2019 10/06/2018 06/23/2018  Falls in the past year? 0 0 0 No No  Number falls in past yr: 0 0 0 - -  Injury with Fall? 0 0 0 - -   Is the patient's home free of loose throw rugs in walkways, pet beds, electrical cords, etc?    yes      Grab bars in the bathroom? yes      Handrails on the stairs?   yes      Adequate lighting?   yes  Timed Get Up and Go performed: na  Depression Screen PHQ 2/9 Scores 05/05/2020 05/05/2020 05/04/2019 10/06/2018  PHQ - 2 Score 0 0 0 0     Cognitive Function MMSE - Mini Mental State Exam 03/19/2018  Orientation to time 5  Orientation to Place 5  Registration 3  Attention/ Calculation 5  Recall 3  Language- name 2 objects 2  Language- repeat 1  Language- follow 3 step command 3  Language- read & follow direction 1  Write a sentence 1  Copy design 1  Total score 30     6CIT Screen 05/05/2020 05/04/2019  What Year? 0 points 0 points  What month? 0 points 0 points  What time? 0 points 0 points  Count back from 20 0 points 0 points  Months in reverse 2 points 2 points  Repeat phrase 2 points 0 points  Total Score 4 2    Immunization History  Administered Date(s) Administered  . Fluad Quad(high Dose 65+) 09/10/2019  . Hep A / Hep B 01/13/2018, 02/12/2018, 07/14/2018  . Influenza Inj Mdck Quad Pf 10/12/2013  . Influenza, High Dose Seasonal PF 09/09/2017, 10/06/2018  . Influenza-Unspecified 09/10/2007, 09/22/2008, 10/15/2012, 09/29/2014, 08/24/2016  . PFIZER SARS-COV-2 Vaccination 02/19/2020, 03/16/2020  . Pneumococcal Conjugate-13 12/30/2017  . Pneumococcal Polysaccharide-23 06/25/2013  . Tdap 09/02/2015  . Zoster 12/28/2013  . Zoster Recombinat (Shingrix) 06/25/2017, 10/03/2017    Qualifies for Shingles Vaccine?up to date  Screening Tests Health Maintenance  Topic Date Due  . INFLUENZA VACCINE  07/24/2020  . MAMMOGRAM  12/13/2020  . COLONOSCOPY  09/10/2021  . TETANUS/TDAP  09/01/2025  . DEXA SCAN  Completed  . COVID-19 Vaccine  Completed  . Hepatitis C Screening  Completed  . PNA vac Low Risk Adult  Completed    Cancer Screenings: Lung: Low Dose CT Chest recommended if Age 37-80 years, 30 pack-year currently smoking OR have quit w/in 15years. Patient  does not qualify. Breast:  Up to date on Mammogram? Yes   Up to date of Bone Density/Dexa? Yes Colorectal: up to date, due 2022  Additional Screenings:  Hepatitis C Screening: completed     Plan:      I have personally reviewed and noted the following in the patient's chart:   . Medical and social history . Use of alcohol,  tobacco or illicit drugs  . Current medications and supplements . Functional ability and status . Nutritional status . Physical activity . Advanced directives . List of other physicians . Hospitalizations, surgeries, and ER visits in previous 12 months . Vitals . Screenings to include cognitive, depression, and falls . Referrals and appointments  In addition, I have reviewed and discussed with patient certain preventive protocols, quality metrics, and best practice recommendations. A written personalized care plan for preventive services as well as general preventive health recommendations were provided to patient.     [name], NP  05/05/2020

## 2020-05-05 NOTE — Telephone Encounter (Signed)
Christina [name], Christina [name] are scheduled for a virtual visit with your provider today.    Just as we do with appointments in the office, we must obtain your consent to participate.  Your consent will be active for this visit and any virtual visit you may have with one of our providers in the next 365 days.    If you have a MyChart account, I can also send a copy of this consent to you electronically.  All virtual visits are billed to your insurance company just like a traditional visit in the office.  As this is a virtual visit, video technology does not allow for your provider to perform a traditional examination.  This may limit your provider's ability to fully assess your condition.  If your provider identifies any concerns that need to be evaluated in person or the need to arrange testing such as labs, EKG, etc, we will make arrangements to do so.    Although advances in technology are sophisticated, we cannot ensure that it will always work on either your end or our end.  If the connection with a video visit is poor, we may have to switch to a telephone visit.  With either a video or telephone visit, we are not always able to ensure that we have a secure connection.   I need to obtain your verbal consent now.   Are you willing to proceed with your visit today?   Christina [name] has provided verbal consent on 05/05/2020 for a virtual visit (video or telephone).   Christina [name], [place] 05/05/2020  2:14 PM

## 2020-05-05 NOTE — Progress Notes (Signed)
   This service is provided via telemedicine  No vital signs collected/recorded due to the encounter was a telemedicine visit.   Location of patient (ex: home, work):  Yes   Patient consents to a telephone visit:  Yes, see telephone encounter dated 05/05/20 with annual consent   Location of the provider (ex: office, home): [place]   Name of any referring provider:  Gayland Curry, DO  Names of all persons participating in the telemedicine service and their role in the encounter:  Evie Jones/CMA, Sherrie Mustache, NP, and Patient   Time spent on call:  7 min with medical assistant

## 2020-05-05 NOTE — Patient Instructions (Signed)
Christina [name] , Thank you for taking time to come for your Medicare Wellness Visit. I appreciate your ongoing commitment to your health goals. Please review the following plan we discussed and let me know if I can assist you in the future.   Screening recommendations/referrals: Colonoscopy up to date, due 2022 Mammogram up to date Bone Density up to date Recommended yearly ophthalmology/optometry visit for glaucoma screening and checkup Recommended yearly dental visit for hygiene and checkup  Vaccinations: Influenza vaccine DUE September 2021 Pneumococcal vaccine up to date Tdap vaccine up to date Shingles vaccine up to date    Advanced directives:  On file.  Conditions/risks identified: fall risk, obesity.   Next appointment:  1 year.    Preventive Care 33 Years and Older, Female Preventive care refers to lifestyle choices and visits with your health care provider that can promote health and wellness. What does preventive care include?  A yearly physical exam. This is also called an annual well check.  Dental exams once or twice a year.  Routine eye exams. Ask your health care provider how often you should have your eyes checked.  Personal lifestyle choices, including:  Daily care of your teeth and gums.  Regular physical activity.  Eating a healthy diet.  Avoiding tobacco and drug use.  Limiting alcohol use.  Practicing safe sex.  Taking low-dose aspirin every day.  Taking vitamin and mineral supplements as recommended by your health care provider. What happens during an annual well check? The services and screenings done by your health care provider during your annual well check will depend on your age, overall health, lifestyle risk factors, and family history of disease. Counseling  Your health care provider may ask you questions about your:  Alcohol use.  Tobacco use.  Drug use.  Emotional well-being.  Home and relationship well-being.  Sexual  activity.  Eating habits.  History of falls.  Memory and ability to understand (cognition).  Work and work Statistician.  Reproductive health. Screening  You may have the following tests or measurements:  Height, weight, and BMI.  Blood pressure.  Lipid and cholesterol levels. These may be checked every 5 years, or more frequently if you are over 99 years old.  Skin check.  Lung cancer screening. You may have this screening every year starting at age 40 if you have a 30-pack-year history of smoking and currently smoke or have quit within the past 15 years.  Fecal occult blood test (FOBT) of the stool. You may have this test every year starting at age 61.  Flexible sigmoidoscopy or colonoscopy. You may have a sigmoidoscopy every 5 years or a colonoscopy every 10 years starting at age 58.  Hepatitis C blood test.  Hepatitis B blood test.  Sexually transmitted disease (STD) testing.  Diabetes screening. This is done by checking your blood sugar (glucose) after you have not eaten for a while (fasting). You may have this done every 1-3 years.  Bone density scan. This is done to screen for osteoporosis. You may have this done starting at age 60.  Mammogram. This may be done every 1-2 years. Talk to your health care provider about how often you should have regular mammograms. Talk with your health care provider about your test results, treatment options, and if necessary, the need for more tests. Vaccines  Your health care provider may recommend certain vaccines, such as:  Influenza vaccine. This is recommended every year.  Tetanus, diphtheria, and acellular pertussis (Tdap, Td) vaccine. You may  need a Td booster every 10 years.  Zoster vaccine. You may need this after age 4.  Pneumococcal 13-valent conjugate (PCV13) vaccine. One dose is recommended after age 46.  Pneumococcal polysaccharide (PPSV23) vaccine. One dose is recommended after age 87. Talk to your health care  provider about which screenings and vaccines you need and how often you need them. This information is not intended to replace advice given to you by your health care provider. Make sure you discuss any questions you have with your health care provider. Document Released: 01/06/2016 Document Revised: 08/29/2016 Document Reviewed: 10/11/2015 Elsevier Interactive Patient Education  2017 [place] Prevention in the Home Falls can cause injuries. They can happen to people of all ages. There are many things you can do to make your home safe and to help prevent falls. What can I do on the outside of my home?  Regularly fix the edges of walkways and driveways and fix any cracks.  Remove anything that might make you trip as you walk through a door, such as a raised step or threshold.  Trim any bushes or trees on the path to your home.  Use bright outdoor lighting.  Clear any walking paths of anything that might make someone trip, such as rocks or tools.  Regularly check to see if handrails are loose or broken. Make sure that both sides of any steps have handrails.  Any raised decks and porches should have guardrails on the edges.  Have any leaves, snow, or ice cleared regularly.  Use sand or salt on walking paths during winter.  Clean up any spills in your garage right away. This includes oil or grease spills. What can I do in the bathroom?  Use night lights.  Install grab bars by the toilet and in the tub and shower. Do not use towel bars as grab bars.  Use non-skid mats or decals in the tub or shower.  If you need to sit down in the shower, use a plastic, non-slip stool.  Keep the floor dry. Clean up any water that spills on the floor as soon as it happens.  Remove soap buildup in the tub or shower regularly.  Attach bath mats securely with double-sided non-slip rug tape.  Do not have throw rugs and other things on the floor that can make you trip. What can I do in  the bedroom?  Use night lights.  Make sure that you have a light by your bed that is easy to reach.  Do not use any sheets or blankets that are too big for your bed. They should not hang down onto the floor.  Have a firm chair that has side arms. You can use this for support while you get dressed.  Do not have throw rugs and other things on the floor that can make you trip. What can I do in the kitchen?  Clean up any spills right away.  Avoid walking on wet floors.  Keep items that you use a lot in easy-to-reach places.  If you need to reach something above you, use a strong step stool that has a grab bar.  Keep electrical cords out of the way.  Do not use floor polish or wax that makes floors slippery. If you must use wax, use non-skid floor wax.  Do not have throw rugs and other things on the floor that can make you trip. What can I do with my stairs?  Do not leave any items on  the stairs.  Make sure that there are handrails on both sides of the stairs and use them. Fix handrails that are broken or loose. Make sure that handrails are as long as the stairways.  Check any carpeting to make sure that it is firmly attached to the stairs. Fix any carpet that is loose or worn.  Avoid having throw rugs at the top or bottom of the stairs. If you do have throw rugs, attach them to the floor with carpet tape.  Make sure that you have a light switch at the top of the stairs and the bottom of the stairs. If you do not have them, ask someone to add them for you. What else can I do to help prevent falls?  Wear shoes that:  Do not have high heels.  Have rubber bottoms.  Are comfortable and fit you well.  Are closed at the toe. Do not wear sandals.  If you use a stepladder:  Make sure that it is fully opened. Do not climb a closed stepladder.  Make sure that both sides of the stepladder are locked into place.  Ask someone to hold it for you, if possible.  Clearly mark and  make sure that you can see:  Any grab bars or handrails.  First and last steps.  Where the edge of each step is.  Use tools that help you move around (mobility aids) if they are needed. These include:  Canes.  Walkers.  Scooters.  Crutches.  Turn on the lights when you go into a dark area. Replace any light bulbs as soon as they burn out.  Set up your furniture so you have a clear path. Avoid moving your furniture around.  If any of your floors are uneven, fix them.  If there are any pets around you, be aware of where they are.  Review your medicines with your doctor. Some medicines can make you feel dizzy. This can increase your chance of falling. Ask your doctor what other things that you can do to help prevent falls. This information is not intended to replace advice given to you by your health care provider. Make sure you discuss any questions you have with your health care provider. Document Released: 10/06/2009 Document Revised: 05/17/2016 Document Reviewed: 01/14/2015 Elsevier Interactive Patient Education  2017 [employer].

## 2020-07-05 ENCOUNTER — Other Ambulatory Visit: Payer: Self-pay | Admitting: Gastroenterology

## 2020-08-23 ENCOUNTER — Telehealth: Payer: Self-pay

## 2020-08-23 DIAGNOSIS — K7031 Alcoholic cirrhosis of liver with ascites: Secondary | ICD-10-CM

## 2020-08-23 NOTE — Telephone Encounter (Signed)
Orders entered.  Scheduled pt for RUQ U/S at [facility] on Wed 9-15 at 9:30am to arrive at 9:15, NPO after midnight.  Called and Left detailed message for pt. Sending letter and MyChart message with appt time and instructions and directions to get labs that day/week.

## 2020-08-23 NOTE — Telephone Encounter (Signed)
-----   Message from Roetta Sessions, [place] sent at 04/06/2020 10:44 AM EDT ----- Regarding: RUQ ultrasound and labs due in 08-2020 Due for RUQ U/S and labs in 08-2020 (middle month) - Cirrhosis - HCC screening  Cbc, cmet, afp and inr

## 2020-09-07 ENCOUNTER — Other Ambulatory Visit (INDEPENDENT_AMBULATORY_CARE_PROVIDER_SITE_OTHER): Payer: Medicare Other

## 2020-09-07 ENCOUNTER — Other Ambulatory Visit: Payer: Self-pay

## 2020-09-07 ENCOUNTER — Ambulatory Visit (HOSPITAL_COMMUNITY)
Admission: RE | Admit: 2020-09-07 | Discharge: 2020-09-07 | Disposition: A | Discharge status: Home / Self Care | Payer: Medicare Other | Source: Ambulatory Visit | Attending: Gastroenterology | Admitting: Gastroenterology

## 2020-09-07 DIAGNOSIS — K7031 Alcoholic cirrhosis of liver with ascites: Secondary | ICD-10-CM | POA: Diagnosis not present

## 2020-09-07 DIAGNOSIS — K7460 Unspecified cirrhosis of liver: Secondary | ICD-10-CM | POA: Diagnosis not present

## 2020-09-07 LAB — CBC WITH DIFFERENTIAL/PLATELET
Basophils Absolute: 0 10*3/uL (ref 0.0–0.1)
Basophils Relative: 0.2 % (ref 0.0–3.0)
Eosinophils Absolute: 0.1 10*3/uL (ref 0.0–0.7)
Eosinophils Relative: 1.9 % (ref 0.0–5.0)
HCT: 37 % (ref 36.0–46.0)
Hemoglobin: 12.7 g/dL (ref 12.0–15.0)
Lymphocytes Relative: 28.3 % (ref 12.0–46.0)
Lymphs Abs: 1.9 10*3/uL (ref 0.7–4.0)
MCHC: 34.4 g/dL (ref 30.0–36.0)
MCV: 93.4 fl (ref 78.0–100.0)
Monocytes Absolute: 0.9 10*3/uL (ref 0.1–1.0)
Monocytes Relative: 13.9 % — ABNORMAL HIGH (ref 3.0–12.0)
Neutro Abs: 3.7 10*3/uL (ref 1.4–7.7)
Neutrophils Relative %: 55.7 % (ref 43.0–77.0)
Platelets: 225 10*3/uL (ref 150.0–400.0)
RBC: 3.96 Mil/uL (ref 3.87–5.11)
RDW: 12.7 % (ref 11.5–15.5)
WBC: 6.7 10*3/uL (ref 4.0–10.5)

## 2020-09-07 LAB — COMPREHENSIVE METABOLIC PANEL
ALT: 13 U/L (ref 0–35)
AST: 20 U/L (ref 0–37)
Albumin: 4.1 g/dL (ref 3.5–5.2)
Alkaline Phosphatase: 64 U/L (ref 39–117)
BUN: 25 mg/dL — ABNORMAL HIGH (ref 6–23)
CO2: 32 mEq/L (ref 19–32)
Calcium: 9.6 mg/dL (ref 8.4–10.5)
Chloride: 95 mEq/L — ABNORMAL LOW (ref 96–112)
Creatinine, Ser: 0.88 mg/dL (ref 0.40–1.20)
GFR: 62.52 mL/min (ref >60.00)
Glucose, Bld: 84 mg/dL (ref 70–99)
Potassium: 4.1 mEq/L (ref 3.5–5.1)
Sodium: 134 mEq/L — ABNORMAL LOW (ref 135–145)
Total Bilirubin: 0.6 mg/dL (ref 0.2–1.2)
Total Protein: 8.1 g/dL (ref 6.0–8.3)

## 2020-09-07 LAB — PROTIME-INR
INR: 1.1 ratio — ABNORMAL HIGH (ref 0.8–1.0)
Prothrombin Time: 11.8 s (ref 9.6–13.1)

## 2020-09-08 LAB — AFP TUMOR MARKER: AFP-Tumor Marker: 2 ng/mL

## 2020-09-09 ENCOUNTER — Other Ambulatory Visit: Payer: Self-pay

## 2020-09-09 DIAGNOSIS — K7031 Alcoholic cirrhosis of liver with ascites: Secondary | ICD-10-CM

## 2020-09-12 ENCOUNTER — Other Ambulatory Visit: Payer: Self-pay

## 2020-09-12 ENCOUNTER — Ambulatory Visit (INDEPENDENT_AMBULATORY_CARE_PROVIDER_SITE_OTHER): Payer: Medicare Other | Admitting: Internal Medicine

## 2020-09-12 ENCOUNTER — Encounter: Payer: Self-pay | Admitting: Internal Medicine

## 2020-09-12 VITALS — BP 122/78 | HR 63 | Temp 97.5°F | Wt 182.0 lb

## 2020-09-12 DIAGNOSIS — E890 Postprocedural hypothyroidism: Secondary | ICD-10-CM

## 2020-09-12 DIAGNOSIS — C880 Waldenstrom macroglobulinemia: Secondary | ICD-10-CM | POA: Diagnosis not present

## 2020-09-12 DIAGNOSIS — G5602 Carpal tunnel syndrome, left upper limb: Secondary | ICD-10-CM

## 2020-09-12 DIAGNOSIS — Z23 Encounter for immunization: Secondary | ICD-10-CM

## 2020-09-12 DIAGNOSIS — F325 Major depressive disorder, single episode, in full remission: Secondary | ICD-10-CM | POA: Diagnosis not present

## 2020-09-12 DIAGNOSIS — E785 Hyperlipidemia, unspecified: Secondary | ICD-10-CM | POA: Diagnosis not present

## 2020-09-12 DIAGNOSIS — K7031 Alcoholic cirrhosis of liver with ascites: Secondary | ICD-10-CM

## 2020-09-12 DIAGNOSIS — H9313 Tinnitus, bilateral: Secondary | ICD-10-CM

## 2020-09-12 DIAGNOSIS — G603 Idiopathic progressive neuropathy: Secondary | ICD-10-CM | POA: Diagnosis not present

## 2020-09-12 NOTE — Progress Notes (Signed)
Location:  [facility] clinic Provider:  Mamadou Breon L. Mariea Clonts, D.O., C.M.D.  Code Status: DNR Goals of Care:  Advanced Directives 09/12/2020  Does Patient Have a Medical Advance Directive? Yes  Type of Advance Directive Out of facility DNR (pink MOST or yellow form)  Does patient want to make changes to medical advance directive? -  Copy of [place] in Chart? -  Pre-existing out of facility DNR order (yellow form or pink MOST form) -   Chief Complaint  Patient presents with  . Medical Management of Chronic Issues    6 month follow up   . Health Maintenance    Influenza (given)    HPI: Patient is a 74 y.o. female seen today for medical management of chronic diseases.    Got her flu shot here today.    Nothing is new.  She wants to go out shopping but does not want to be dead.    She has good and bad days with her mood according to what happens.  Her husband's sister passed away this past week--she was in her 47s and had Alzheimer's.  She has a gentleman friend now she met on the internet.  He drives to visit her from [facility].  She rests ok at night.  Hands still fall asleep.    She notices that the tinnitus is really bad and those same days her balance is off.  She only uses her cane out.  She has not used her walker in she does not know how long.  Suggested using the cane if she has more ringing in her ears.  It's annoying.  She hears very well.  It has no pattern.  It does not interfere with her life.  She had her sonogram Wednesday to f/u on her cirrhosis and r/o mass.  Dr. Havery Moros cut down her "tee tee" medicine.  She is making an effort to drink more water, but she doesn't really get thirsty.  Still drinking her lactulose and having bms--1-2 per day.  She measures it in a shot glass and if she takes too much, she cannot leave the house.    Past Medical History:  Diagnosis Date  . Abnormal EKG   . Abnormal finding on thyroid function test   . Abnormal liver  function test   . Acute edema   . 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 Anemia   . Anxiety   . Arthritis   . Ascites   . Back pain   . Breast mass   . Chronic low back pain   . Complete right rotator cuff tear   . Complication of anesthesia    during first surgery- woke up during surgery many years ago  . Compression fracture of L4 lumbar vertebra   . Dermatitis, eczematoid   . Difficulty breathing   . Gallstones   . History of adenocarcinoma of breast   . Hypercholesterolemia   . Hyperkalemia   . Hypertension   . Hypokalemia   . Hypothyroidism   . Insomnia   . Knee pain   . Leukocytosis   . Lymphadenopathy   . Macrocytosis   . Memory loss or impairment   . Menopause   . MGUS (monoclonal gammopathy of unknown significance)   . Osteoarthritis   . Osteoarthritis of right knee   . Other cirrhosis of liver ([place])   . Renal insufficiency syndrome   . Right knee DJD   .  Solitary thyroid nodule   . Unspecified lump in the left breast, unspecified quadrant   . Vitamin B 12 deficiency   . 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[place])     Past Surgical History:  Procedure Laterality Date  . BREAST SURGERY  2014   Removed lymphnodes  . EYE SURGERY  2006   bilateral cataract  . JOINT REPLACEMENT     right  (2017)and left knee  . LAPAROSCOPIC CHOLECYSTECTOMY SINGLE SITE WITH INTRAOPERATIVE CHOLANGIOGRAM N/A 02/19/2018   Procedure: LAPAROSCOPIC CHOLECYSTECTOMY;  Surgeon: [name], MD;  Location: [facility];  Service: General;  Laterality: N/A;  . LESION REMOVAL  09/2015   tubular adenoma-4 subcentimeter lesions  . LIVER BIOPSY  02/19/2018   Procedure: LIVER BIOPSY;  Surgeon: [name], MD;  Location: [facility];  Service: General;;  . right thumb surgery    . TONSILLECTOMY    . TOTAL KNEE ARTHROPLASTY Right 09/05/2016  . UMBILICAL HERNIA REPAIR  02/19/2018   Procedure: REPAIR OF INCARCERATED UMBILICAL HERNIA;  Surgeon: [name], MD;   Location: [facility];  Service: General;;    No Known Allergies  Outpatient Encounter Medications as of 09/12/2020  Medication Sig  . Cholecalciferol (VITAMIN D3) 2000 units TABS Take 1 tablet by mouth every morning.  . citalopram (CELEXA) 40 MG tablet TAKE ONE TABLET BY MOUTH DAILY  . furosemide (LASIX) 20 MG tablet Take 10 mg by mouth daily.  Marland Kitchen gabapentin (NEURONTIN) 100 MG capsule TAKE ONE CAPSULE BY MOUTH EVERY NIGHT AT BEDTIME  . ketoconazole (NIZORAL) 2 % shampoo APPLY TO AFFECTED AREA(S) 2 TIMES PER WEEK  . lactulose (CHRONULAC) 10 GM/15ML solution Take 15 mLs (10 g total) by mouth daily. Take once or twice daily as needed  . levothyroxine (SYNTHROID) 75 MCG tablet TAKE ONE TABLET BY MOUTH EVERY MORNING BEFORE BREAKFAST  . Multiple Vitamin (MULTIVITAMIN) tablet Take 1 tablet by mouth daily.  . propranolol (INDERAL) 20 MG tablet TAKE ONE TABLET BY MOUTH EVERY MORNING  . spironolactone (ALDACTONE) 25 MG tablet Take 25 mg by mouth daily.  . vitamin B-12 (CYANOCOBALAMIN) 1000 MCG tablet Take 1,000 mcg by mouth every morning.  . [DISCONTINUED] furosemide (LASIX) 20 MG tablet TAKE ONE TABLET BY MOUTH EVERY MORNING (Patient taking differently: 10 mg. )  . [DISCONTINUED] spironolactone (ALDACTONE) 50 MG tablet TAKE ONE TABLET BY MOUTH DAILY (Patient taking differently: 25 mg. )   No facility-administered encounter medications on file as of 09/12/2020.    Review of Systems:  Review of Systems  Constitutional: Negative for chills, fever and malaise/fatigue.  HENT: Positive for tinnitus. Negative for congestion, hearing loss and sore throat.   Eyes: Negative for blurred vision.  Respiratory: Negative for cough and shortness of breath.   Cardiovascular: Negative for chest pain, palpitations and leg swelling.  Gastrointestinal: Negative for abdominal pain, blood in stool, constipation and melena.  Genitourinary: Negative for dysuria.  Musculoskeletal: Positive for joint pain. Negative for falls.   Neurological: Positive for dizziness, tingling and sensory change. Negative for loss of consciousness.       Uses cane  Endo/Heme/Allergies: Bruises/bleeds easily.  Psychiatric/Behavioral: Negative for depression and memory loss. The patient is not nervous/anxious and does not have insomnia.     Health Maintenance  Topic Date Due  . MAMMOGRAM  12/13/2020  . COLONOSCOPY  09/10/2021  . TETANUS/TDAP  09/01/2025  . INFLUENZA VACCINE  Completed  . DEXA SCAN  Completed  . COVID-19 Vaccine  Completed  . Hepatitis C Screening  Completed  . PNA vac  Low Risk Adult  Completed    Physical Exam: Vitals:   09/12/20 0840  BP: 122/78  Pulse: 63  Temp: (!) 97.5 F (36.4 C)  SpO2: 98%  Weight: 182 lb (82.6 kg)   Body mass index is 38.04 kg/m. Physical Exam Vitals reviewed.  Constitutional:      General: She is not in acute distress.    Appearance: Normal appearance. She is not toxic-appearing.  HENT:     Head: Normocephalic and atraumatic.  Eyes:     Extraocular Movements: Extraocular movements intact.     Pupils: Pupils are equal, round, and reactive to light.  Cardiovascular:     Rate and Rhythm: Normal rate and regular rhythm.     Pulses: Normal pulses.     Heart sounds: Normal heart sounds.  Pulmonary:     Effort: Pulmonary effort is normal.     Breath sounds: No wheezing, rhonchi or rales.  Abdominal:     General: Bowel sounds are normal.  Musculoskeletal:        General: Normal range of motion.     Cervical back: Neck supple.     Right lower leg: No edema.     Left lower leg: No edema.  Skin:    General: Skin is warm and dry.  Neurological:     General: No focal deficit present.     Mental Status: She is alert and oriented to person, place, and time.     Sensory: Sensory deficit present.     Gait: Gait abnormal.  Psychiatric:        Mood and Affect: Mood normal.        Behavior: Behavior normal.     Labs reviewed: Basic Metabolic Panel: Recent Labs     09/15/19 1104 03/30/20 0949 09/07/20 1025  NA  --  138 134*  K  --  4.4 4.1  CL  --  100 95*  CO2  --  31 32  GLUCOSE  --  97 84  BUN  --  24* 25*  CREATININE 0.95 0.88 0.88  CALCIUM  --  9.4 9.6   Liver Function Tests: Recent Labs    03/30/20 0949 09/07/20 1025  AST 21 20  ALT 16 13  ALKPHOS 65 64  BILITOT 0.5 0.6  PROT 7.5 8.1  ALBUMIN 3.9 4.1   No results for input(s): LIPASE, AMYLASE in the last 8760 hours. No results for input(s): AMMONIA in the last 8760 hours. CBC: Recent Labs    03/30/20 0949 09/07/20 1025  WBC 7.7 6.7  NEUTROABS 4.9 3.7  HGB 12.4 12.7  HCT 35.7* 37.0  MCV 93.4 93.4  PLT 195.0 225.0   Lipid Panel: No results for input(s): CHOL, HDL, LDLCALC, TRIG, CHOLHDL, LDLDIRECT in the last 8760 hours. No results found for: HGBA1C  Procedures since last visit: US Abdomen Limited RUQ  Result Date: 09/07/2020 CLINICAL DATA:  Hepatocellular carcinoma screening, alcoholic cirrhosis, post cholecystectomy. EXAM: ULTRASOUND ABDOMEN LIMITED RIGHT UPPER QUADRANT COMPARISON:  Ultrasound abdomen 03/09/2020, MRI abdomen 09/11/2019 FINDINGS: Gallbladder: Surgically absent. Negative sonographic Percell Miller sign was reported by the ultrasound technician. Common bile duct: Diameter: 4 mm. Liver: No focal lesion identified. Nodular contour. Heterogeneous and coarsened parenchymal echogenicity. Portal vein is patent on color Doppler imaging with normal direction of blood flow towards the liver. Other: None. IMPRESSION: Cirrhosis. No findings suggest portal hypertension or focal hepatic lesion. Electronically Signed   By: Iven Finn M.D.   On: 09/07/2020 23:09    Assessment/Plan 1.  Alcoholic cirrhosis of liver with ascites ([place]) -followed closed with Dr. Havery Moros, just had f/u sonogram and CT from April was negative for mass - COMPLETE METABOLIC PANEL WITH GFR  2. Waldenstrom's macroglobulinemia ([place]) -just had f/u cbc wnl with GI -?if this is the cause of her  tinnitus  3. Postoperative hypothyroidism - cont current levothyroxine pending lab - TSH  4. Idiopathic progressive neuropathy -ongoing and has carpal tunnel syndrome  5. Depression, major, in remission ([place]) -continue celexa -spirits seem much improved today  6. Left carpal tunnel syndrome -not a surgical candidate with her comorbidities   7. Tinnitus of both ears -cause not entirely clear, hears well -may be related to her waldenstrom's -comes and goes  8. Need for influenza vaccination - Flu Vaccine QUAD High Dose(Fluad) given  9. Hyperlipidemia, unspecified hyperlipidemia type -f/u labs today - Lipid panel - COMPLETE METABOLIC PANEL WITH GFR  Labs/tests ordered:   Lab Orders  No laboratory test(s) ordered today   Next appt:  6 mos med mgt, fasting labs same day  Karlin Binion L. Coleson Kant, D.O. [facility] [address], [place] [address] Cell Phone (Mon-Fri 8am-5pm):  [phone] On Call:  [phone] & follow prompts after 5pm & weekends Office Phone:  [phone] Office Fax:  [phone]

## 2020-09-13 ENCOUNTER — Telehealth: Payer: Self-pay | Admitting: *Deleted

## 2020-09-13 LAB — COMPLETE METABOLIC PANEL WITH GFR
AG Ratio: 1.1 (calc) (ref 1.0–2.5)
ALT: 13 U/L (ref 6–29)
AST: 23 U/L (ref 10–35)
Albumin: 3.8 g/dL (ref 3.6–5.1)
Alkaline phosphatase (APISO): 64 U/L (ref 37–153)
BUN: 19 mg/dL (ref 7–25)
CO2: 29 mmol/L (ref 20–32)
Calcium: 9.3 mg/dL (ref 8.6–10.4)
Chloride: 98 mmol/L (ref 98–110)
Creat: 0.79 mg/dL (ref 0.60–0.93)
GFR, Est African American: 85 mL/min/{1.73_m2} (ref >=60)
GFR, Est Non African American: 73 mL/min/{1.73_m2} (ref >=60)
Globulin: 3.6 g/dL (calc) (ref 1.9–3.7)
Glucose, Bld: 80 mg/dL (ref 65–99)
Potassium: 3.8 mmol/L (ref 3.5–5.3)
Sodium: 137 mmol/L (ref 135–146)
Total Bilirubin: 0.6 mg/dL (ref 0.2–1.2)
Total Protein: 7.4 g/dL (ref 6.1–8.1)

## 2020-09-13 LAB — LIPID PANEL
Cholesterol: 190 mg/dL (ref <200)
HDL: 74 mg/dL (ref >=50)
LDL Cholesterol (Calc): 101 mg/dL (calc) — ABNORMAL HIGH
Non-HDL Cholesterol (Calc): 116 mg/dL (calc) (ref <130)
Total CHOL/HDL Ratio: 2.6 (calc) (ref <5.0)
Triglycerides: 67 mg/dL (ref <150)

## 2020-09-13 LAB — TSH: TSH: 8.45 mIU/L — ABNORMAL HIGH (ref 0.40–4.50)

## 2020-09-13 MED ORDER — LEVOTHYROXINE SODIUM 88 MCG PO TABS
88.0000 ug | ORAL_TABLET | Freq: Every day | ORAL | 1 refills | Status: DC
Start: 2020-09-13 — End: 2021-03-27

## 2020-09-13 NOTE — Progress Notes (Signed)
Electrolytes, liver and kidneys look ok on labs. Bad cholesterol remains right around 100. TSH is high suggesting either missed doses or need for an increase in the levothyroxine.  Can you please confirm with her that she has not missed any of her thyroid medication?  If she has not, we will need to increase her levothyroxine to 105mg each morning at least 30 mins before breakfast and any other pills.   We'll recheck at her next visit with other labs.

## 2020-09-13 NOTE — Telephone Encounter (Signed)
Patient daughter, Jackelyn Poling Notified and agreed. Stated that patient does take her Levothyroxine daily.  Rx sent to Pharmacy for increase.

## 2020-09-13 NOTE — Telephone Encounter (Signed)
-----   Message from Gayland Curry, DO sent at 09/13/2020  6:25 AM EDT ----- Electrolytes, liver and kidneys look ok on labs. Bad cholesterol remains right around 100. TSH is high suggesting either missed doses or need for an increase in the levothyroxine.  Can you please confirm with her that she has not missed any of her thyroid medication?  If she has not, we will need to increase her levothyroxine to 65mg each morning at least 30 mins before breakfast and any other pills.   We'll recheck at her next visit with other labs.

## 2020-09-23 ENCOUNTER — Telehealth: Payer: Self-pay

## 2020-09-23 NOTE — Telephone Encounter (Signed)
Spoke with patient's daughter to remind her that patient is due for repeat labs. Advised that she can go by at her convenience between 7:30 am - 5 pm, Monday through Friday, no appt necessary. Daughter is aware that I also sent a My Chart message.

## 2020-09-23 NOTE — Telephone Encounter (Signed)
-----   Message from Yevette Edwards, RN sent at 09/09/2020 10:41 AM EDT ----- Regarding: Labs Repeat BMET, order in epic

## 2020-09-30 ENCOUNTER — Other Ambulatory Visit (INDEPENDENT_AMBULATORY_CARE_PROVIDER_SITE_OTHER): Payer: Medicare Other

## 2020-09-30 DIAGNOSIS — K7031 Alcoholic cirrhosis of liver with ascites: Secondary | ICD-10-CM

## 2020-09-30 DIAGNOSIS — Z23 Encounter for immunization: Secondary | ICD-10-CM | POA: Diagnosis not present

## 2020-09-30 LAB — BASIC METABOLIC PANEL
BUN: 18 mg/dL (ref 6–23)
CO2: 30 mEq/L (ref 19–32)
Calcium: 9.5 mg/dL (ref 8.4–10.5)
Chloride: 99 mEq/L (ref 96–112)
Creatinine, Ser: 0.85 mg/dL (ref 0.40–1.20)
GFR: 66.65 mL/min (ref >60.00)
Glucose, Bld: 87 mg/dL (ref 70–99)
Potassium: 4.2 mEq/L (ref 3.5–5.1)
Sodium: 136 mEq/L (ref 135–145)

## 2020-10-04 ENCOUNTER — Other Ambulatory Visit: Payer: Self-pay

## 2020-10-04 ENCOUNTER — Other Ambulatory Visit: Payer: Self-pay | Admitting: Gastroenterology

## 2020-10-04 MED ORDER — SPIRONOLACTONE 25 MG PO TABS
25.0000 mg | ORAL_TABLET | Freq: Every day | ORAL | 1 refills | Status: DC
Start: 2020-10-04 — End: 2021-04-03

## 2020-10-05 ENCOUNTER — Other Ambulatory Visit: Payer: Self-pay | Admitting: Gastroenterology

## 2020-10-05 ENCOUNTER — Other Ambulatory Visit: Payer: Self-pay | Admitting: Internal Medicine

## 2020-10-05 DIAGNOSIS — I8510 Secondary esophageal varices without bleeding: Secondary | ICD-10-CM

## 2020-10-06 NOTE — Telephone Encounter (Signed)
Levothyroxine was just filled on 09/13/2020 for a 90 day supply. rx sent to pharmacy by e-script

## 2020-10-08 IMAGING — MR MRI ABDOMEN WITH AND WITHOUT CONTRAST
9 of 18 series · 20 of 48 positions shown · IV contrast (gadavist)
Comparison: Abdominal MRI 09/01/2018.

CLINICAL DATA: 72-year-old female with history of cirrhosis.
Hypervascular hepatic lesion noted on prior abdominal MRI. Follow-up
study.

EXAM:
MRI ABDOMEN WITHOUT AND WITH CONTRAST
TECHNIQUE: Multiplanar multisequence MR imaging of the abdomen was performed
both before and after the administration of intravenous contrast.
CONTRAST:  7 mL of Gadavist.

[Series 3: T2 fat-sat · axial · 5.0mm · 0.78mm/px · z∈[-37,+168]mm · 3 of 42 slices shown]
[im 1/42]
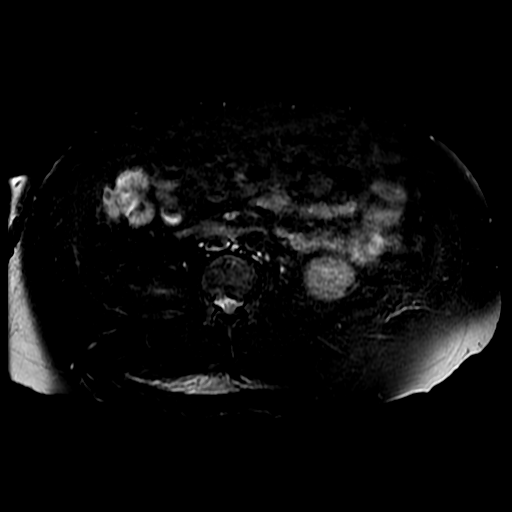
[im 21/42]
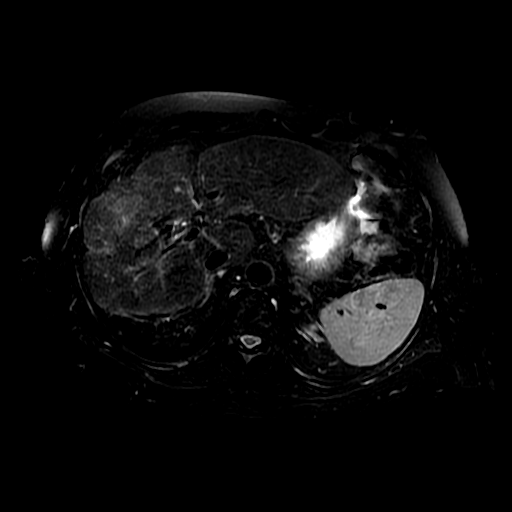
[im 42/42]
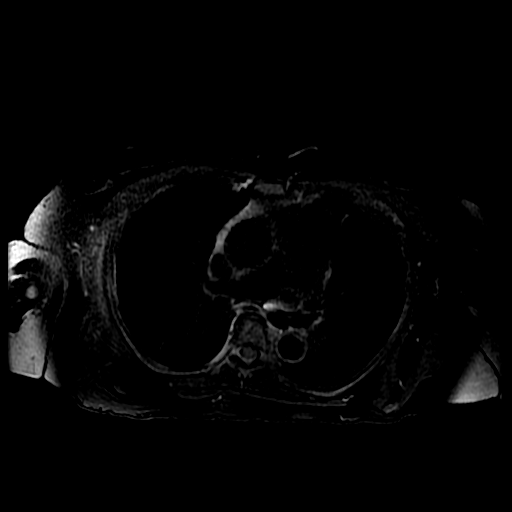

[Series 4: DWI b500 · axial · 6.0mm · 1.64mm/px · z∈[-63,+179]mm · 2 of 63 slices shown]
[im 1/63]
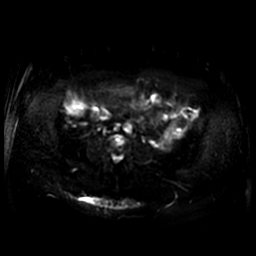
[im 63/63]
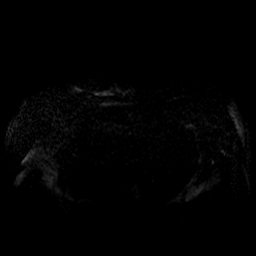

[Series 5: T2 · axial · 5.0mm · 0.78mm/px · z∈[-37,+168]mm · 2 of 42 slices shown (1 of 2)]
[im 1/42]
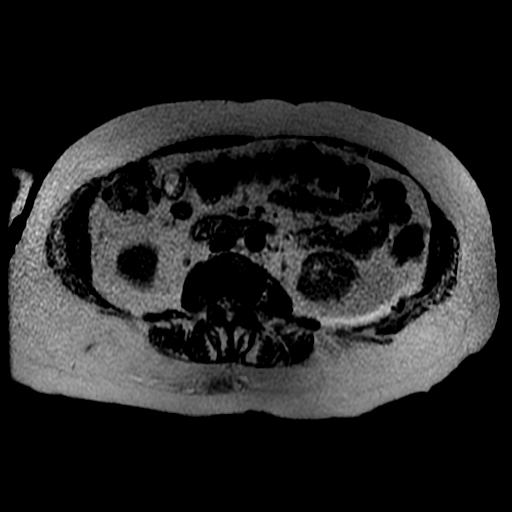
[im 42/42]
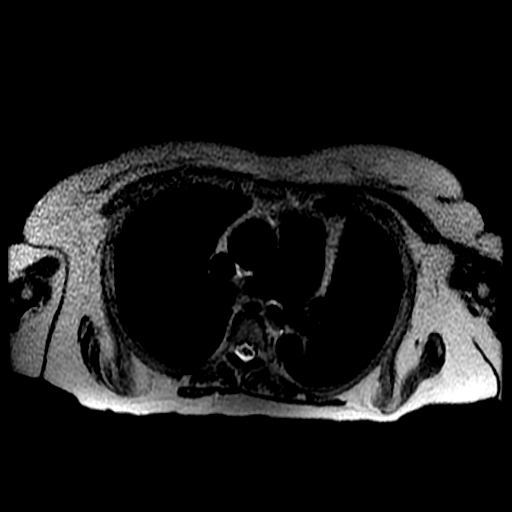

[Series 6: T2 · coronal · 5.0mm · 0.74mm/px · 2 of 43 slices shown (2 of 2)]
[im 1/43]
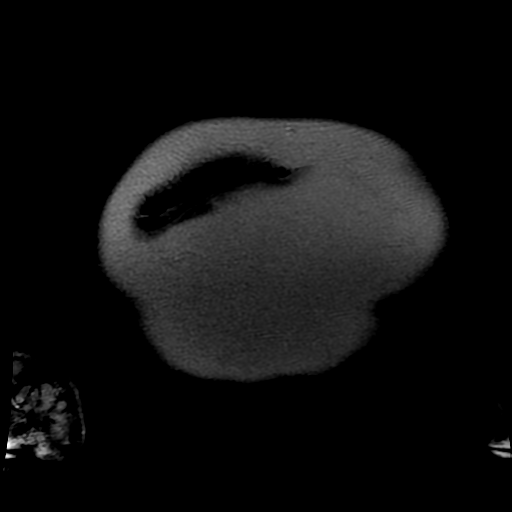
[im 43/43]
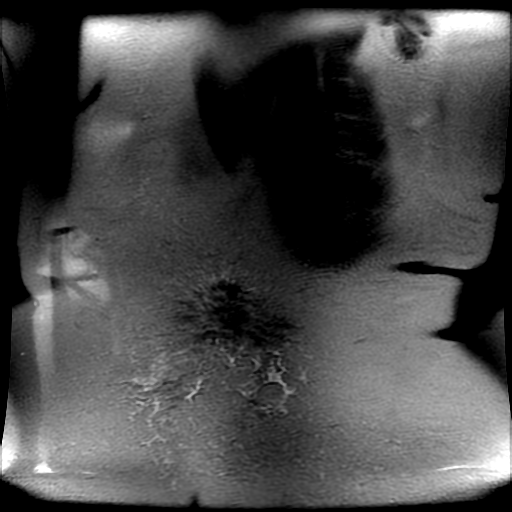

[Series 7: bSSFP · axial · 5.0mm · 0.78mm/px · z∈[-37,+168]mm · 2 of 42 slices shown]
[im 1/42]
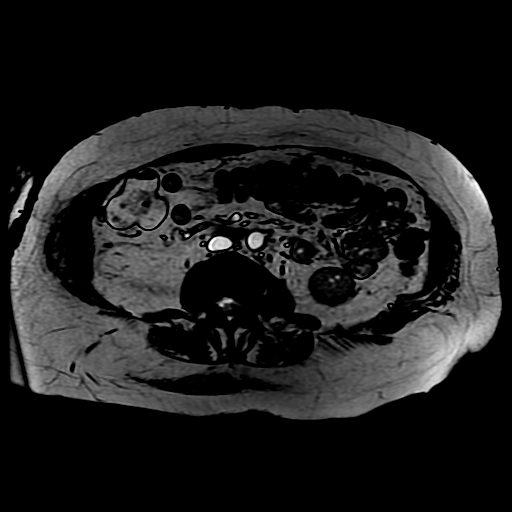
[im 42/42]
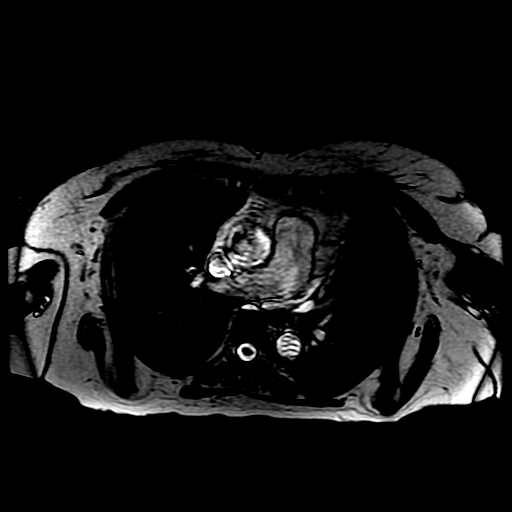

[Series 8: ax dualecho bh · axial · 5.0mm · 0.78mm/px · z∈[-37,+168]mm · 3 of 84 slices shown]
[im 1/84]
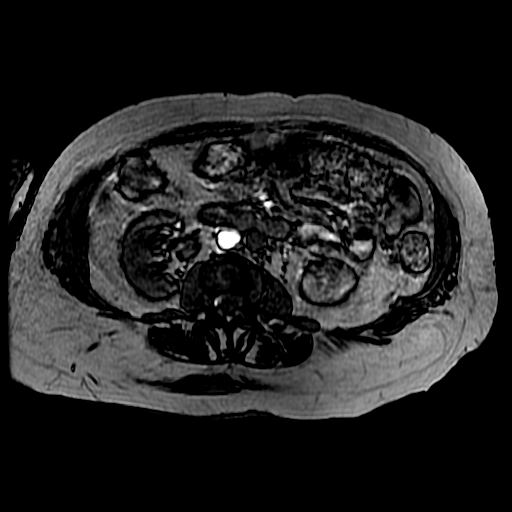
[im 42/84]
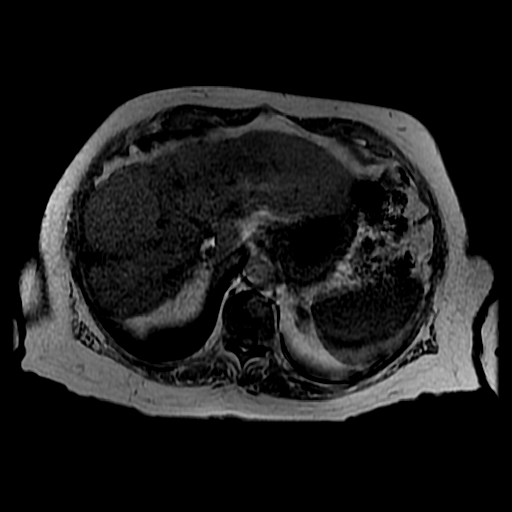
[im 84/84]
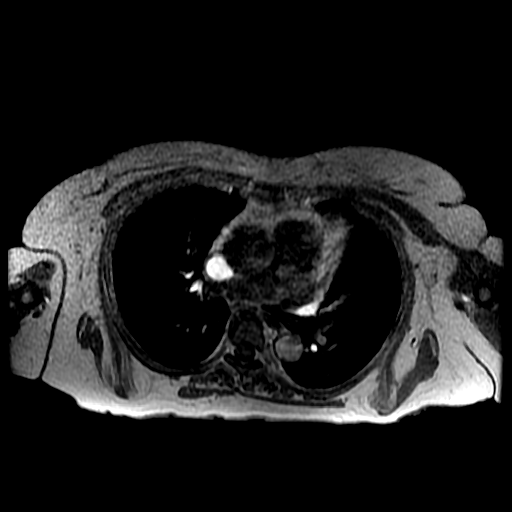

[Series 400: DWI · axial · 6.0mm · 1.64mm/px · 1 of 32 slices shown]
[im 1/32]
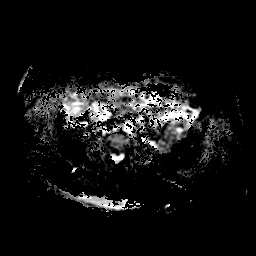

[Series 900: T1 dynamic · axial · 5.0mm · 0.78mm/px · z∈[-57,+160]mm · 3 of 88 slices shown (1 of 2)]
[im 1/88]
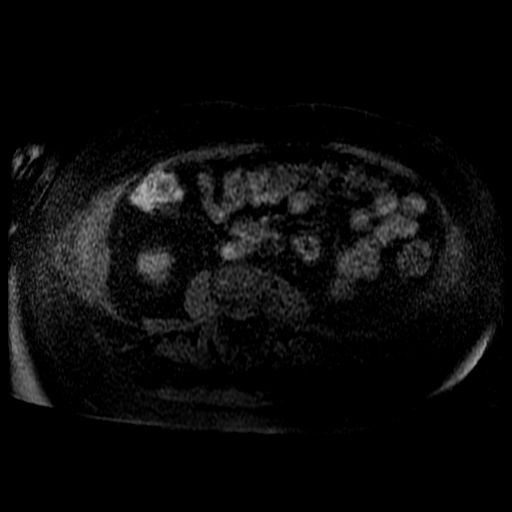
[im 44/88]
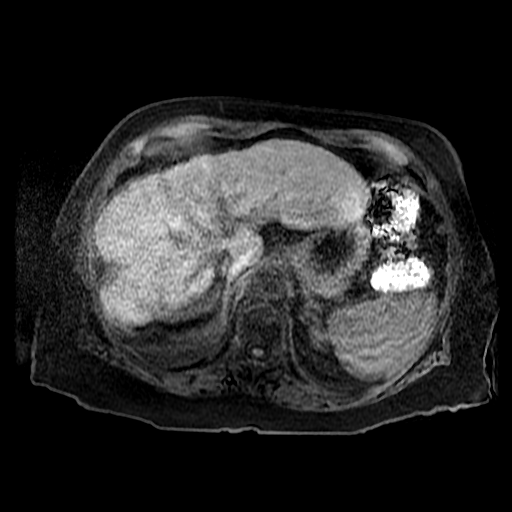
[im 88/88]
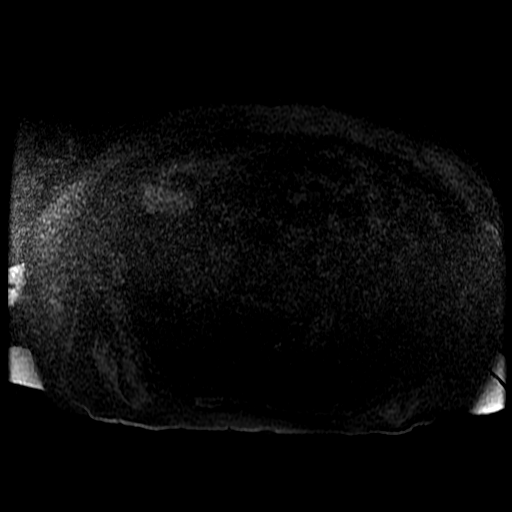

[Series 901: T1 dynamic · axial · 5.0mm · 0.78mm/px · z∈[-57,+50]mm · 2 of 88 slices shown (2 of 2)]
[im 1/88]
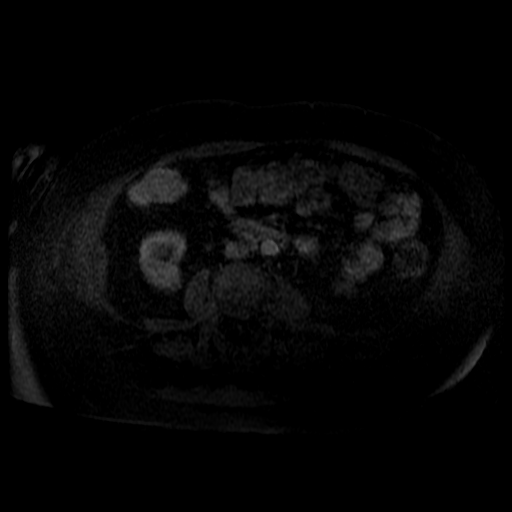
[im 44/88]
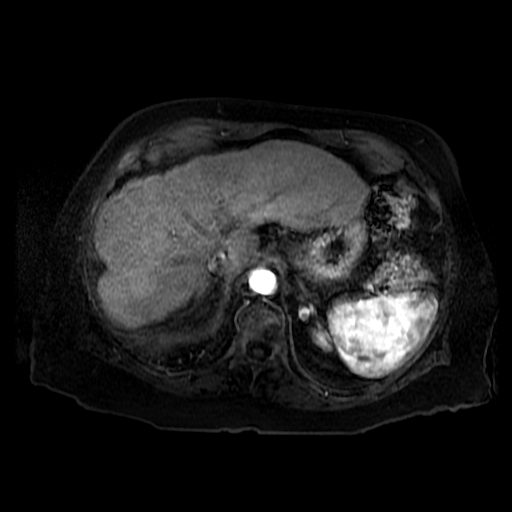

[20 of 48 positions shown; findings below may reference images not displayed]

FINDINGS: Lower chest: Unremarkable.

Hepatobiliary: The liver has a shrunken appearance and very nodular
contour, indicative of advanced cirrhosis. The area of concern in
the lateral aspect of the right lobe of the liver is very similar to
the prior examination with a focal area of capsular retraction which
is associated with some underlying T1 hypointensity and T2
hyperintensity. Post gadolinium images demonstrate no definite
hypervascular enhancement of this region, however, there is delayed
enhancement of this region (axial image 52 of series 904), similar
to the prior study, most compatible with a focal area of confluent
hepatic fibrosis. Additional lace-like pattern of T2 hyperintensity
and delayed hyperenhancement noted elsewhere throughout the liver,
most evident in the right lobe, compatible with additional areas of
developing fibrosis. No discrete cystic or solid hepatic lesions. No
intra or extrahepatic biliary ductal dilatation. Status post
cholecystectomy.

Pancreas: No pancreatic mass. No pancreatic ductal dilatation. No
pancreatic or peripancreatic fluid or inflammatory changes.

Spleen:  Unremarkable.

Adrenals/Urinary Tract: 11 mm T1 hypointense, T2 hyperintense,
nonenhancing lesion in the interpolar region of the right kidney,
compatible with a simple cyst. Left kidney and bilateral adrenal
glands are normal in appearance. No hydroureteronephrosis in the
visualized portions of the abdomen.

Stomach/Bowel: Visualized portions are unremarkable.

Vascular/Lymphatic: Aortic atherosclerosis. No aneurysm identified
in the visualized abdominal vasculature. No lymphadenopathy noted in
the visualized abdomen.

Other: No significant volume of ascites noted in the visualized
portions of the abdomen.

Musculoskeletal: Lesion in the left side of the T10 vertebral body
which parallels fat signal intensity on all pulse sequences,
presumably a cavernous hemangioma. No aggressive appearing osseous
lesions are noted in the visualized portions of the skeleton.
IMPRESSION: 1. Advanced cirrhotic changes in the liver with areas of confluent
hepatic fibrosis in the periphery of the right lobe of the liver,
similar to the prior study. No suspicious hypervascular lesion to
suggest hepatocellular carcinoma noted at this time.
2. Aortic atherosclerosis.

## 2020-10-24 ENCOUNTER — Encounter: Payer: Self-pay | Admitting: Gastroenterology

## 2020-10-24 ENCOUNTER — Ambulatory Visit (INDEPENDENT_AMBULATORY_CARE_PROVIDER_SITE_OTHER): Payer: Medicare Other | Admitting: Gastroenterology

## 2020-10-24 VITALS — BP 108/76 | HR 64 | Ht <= 58 in | Wt 185.0 lb

## 2020-10-24 DIAGNOSIS — K7460 Unspecified cirrhosis of liver: Secondary | ICD-10-CM | POA: Diagnosis not present

## 2020-10-24 DIAGNOSIS — K7290 Hepatic failure, unspecified without coma: Secondary | ICD-10-CM | POA: Diagnosis not present

## 2020-10-24 DIAGNOSIS — I8500 Esophageal varices without bleeding: Secondary | ICD-10-CM

## 2020-10-24 DIAGNOSIS — K7682 Hepatic encephalopathy: Secondary | ICD-10-CM

## 2020-10-24 MED ORDER — LACTULOSE 10 GM/15ML PO SOLN: 10.0000 g | Freq: Every day | ORAL | 3 refills | Status: DC

## 2020-10-24 NOTE — Patient Instructions (Addendum)
You will be due for an ultrasound of the abdomen in March 2022 for [facility] screening. We will contact you with an appointment when it gets closer to that time.  Your provider has requested that you go to the basement level for lab work in March 2022. Press "B" on the elevator. The lab is located at the first door on the left as you exit the elevator.  Please follow up with Dr Havery Moros in the office in May 2022 (6 months).  We have sent the following medications to your pharmacy for you to pick up at your convenience: Lactulose  If you are age 76 or older, your body mass index should be between 23-30. Your Body mass index is 40.03 kg/m. If this is out of the aforementioned range listed, please consider follow up with your Primary Care Provider.  Due to recent changes in healthcare laws, you may see the results of your imaging and laboratory studies on MyChart before your provider has had a chance to review them.  We understand that in some cases there may be results that are confusing or concerning to you. Not all laboratory results come back in the same time frame and the provider may be waiting for multiple results in order to interpret others.  Please give [place] 48 hours in order for your provider to thoroughly review all the results before contacting the office for clarification of your results.

## 2020-10-24 NOTE — Progress Notes (Signed)
HPI :  74 year old female here for a follow-up visit for cirrhosis.  Please see prior notes for details of her case.  She was diagnosed when she lived in [place] previously a few years ago, was reportedly due to alcohol, she drank heavily when caring for her husband who had dementia in the past.  She previously had a prior liver biopsy done with Dr. Johney Maine at the time of her cholecystectomy which showed cirrhosis, Mallory bodies on biopsy.  Negative serologic work-up.  She has a history of small varices for which she takes propranolol.  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  She states she is doing really well since of last seen her.  She is on Lasix 20 mg a day and Aldactone 25 mg a day, this typically controls her swelling and she denies any problems with ascites.  She is on propranolol 20 mg a day, she has a resting low heart rate but states she tolerates it at once daily dosing without dizziness or lightheadedness.  She is compliant with her lactulose at once a day.  She takes it once a day she has fairly regular bowels, if she takes any more than that she will have diarrhea that bothers her.  No blood in her stools or concerning symptoms.  No abdominal pains.  She does continue to drink about a glass of wine at most once per week.  She had the Covid vaccine with her booster already and the rest of her immunizations are up-to-date.  She feels quite well.    Right upper quadrant ultrasound done on 09/07/2020 - IMPRESSION: Cirrhosis. No findings suggest portal hypertension or focal hepatic lesion.   Prior endoscopic workup Colonoscopy -10/13/2015 - Dr. Adolph Pollack at [facility] [place] [facility]. - exam was a poor prep which was aborted in the left colon, told to repeat with 2 day prep. Colonoscopy -10/20/2015 - Dr. Adolph Pollack - 3 ascending polyps 5-57m in size, one small sigmoid polyp removed, diverticulosis -  fair bowel prep - recommended 3 year follow up Colonoscopy 09/10/2018 -8 small polyps - largest 829min size, diverticulosis, hemorrhoids -Repeat 3 years  2016 - EGD - small esophageal varices, mild PHG  MRI liver 09/11/19 -IMPRESSION: 1. Hepatic cirrhosis with fibrosis and architectural distortion. No liver mass identified 2. Splenorenal shunting indicating portal venous hypertension. 3. Descending and sigmoid colon diverticulosis. 4. Aortic Atherosclerosis (ICD10-I70.0).   Past Medical History:  Diagnosis Date  . Abnormal EKG   . Abnormal finding on thyroid function test   . Abnormal liver function test   . Acute edema   . 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 Anemia   . Anxiety   . Arthritis   . Ascites   . Back pain   . Breast mass   . Chronic low back pain   . Complete right rotator cuff tear   . Complication of anesthesia    during first surgery- woke up during surgery many years ago  . Compression fracture of L4 lumbar vertebra   . Dermatitis, eczematoid   . Difficulty breathing   . Gallstones   . History of adenocarcinoma of breast   . Hypercholesterolemia   . Hyperkalemia   . Hypertension   . Hypokalemia   . Hypothyroidism   . Insomnia   . Knee pain   . Leukocytosis   . Lymphadenopathy   . Macrocytosis   .  Memory loss or impairment   . Menopause   . MGUS (monoclonal gammopathy of unknown significance)   . Osteoarthritis   . Osteoarthritis of right knee   . Other cirrhosis of liver ([place])   . Renal insufficiency syndrome   . Right knee DJD   . Solitary thyroid nodule   . Unspecified lump in the left breast, unspecified quadrant   . Vitamin B 12 deficiency   . 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[place])      Past Surgical History:  Procedure Laterality Date  . BREAST SURGERY  2014   Removed lymphnodes  . EYE SURGERY  2006   bilateral cataract  . JOINT REPLACEMENT     right  (2017)and left knee  . LAPAROSCOPIC  CHOLECYSTECTOMY SINGLE SITE WITH INTRAOPERATIVE CHOLANGIOGRAM N/A 02/19/2018   Procedure: LAPAROSCOPIC CHOLECYSTECTOMY;  Surgeon: [name], MD;  Location: [facility];  Service: General;  Laterality: N/A;  . LESION REMOVAL  09/2015   tubular adenoma-4 subcentimeter lesions  . LIVER BIOPSY  02/19/2018   Procedure: LIVER BIOPSY;  Surgeon: [name], MD;  Location: [facility];  Service: General;;  . right thumb surgery    . TONSILLECTOMY    . TOTAL KNEE ARTHROPLASTY Right 09/05/2016  . UMBILICAL HERNIA REPAIR  02/19/2018   Procedure: REPAIR OF INCARCERATED UMBILICAL HERNIA;  Surgeon: [name], MD;  Location: [facility];  Service: General;;   Family History  Problem Relation Age of Onset  . Breast cancer Mother   . Colon cancer Neg Hx   . Stomach cancer Neg Hx   . Rectal cancer Neg Hx   . Liver cancer Neg Hx   . Esophageal cancer Neg Hx    Social History   Tobacco Use  . Smoking status: Never Smoker  . Smokeless tobacco: Never Used  Vaping Use  . Vaping Use: Never used  Substance Use Topics  . Alcohol use: No    Comment: 6 or more per day in the past.   . Drug use: No   Current Outpatient Medications  Medication Sig Dispense Refill  . Cholecalciferol (VITAMIN D3) 2000 units TABS Take 1 tablet by mouth every morning.    . citalopram (CELEXA) 40 MG tablet TAKE ONE TABLET BY MOUTH DAILY 90 tablet 3  . furosemide (LASIX) 20 MG tablet Take 0.5 tablets (10 mg total) by mouth every morning. 45 tablet 1  . gabapentin (NEURONTIN) 100 MG capsule TAKE ONE CAPSULE BY MOUTH EVERY NIGHT AT BEDTIME 90 capsule 2  . ketoconazole (NIZORAL) 2 % shampoo APPLY TO AFFECTED AREA(S) 2 TIMES PER WEEK 120 mL 2  . lactulose (CHRONULAC) 10 GM/15ML solution Take 15 mLs (10 g total) by mouth daily. Take once or twice daily as needed 473 mL 11  . levothyroxine (SYNTHROID) 88 MCG tablet Take 1 tablet (88 mcg total) by mouth daily before breakfast. 30 minutes before breakfast and any other medications. 90 tablet 1   . Multiple Vitamin (MULTIVITAMIN) tablet Take 1 tablet by mouth daily.    . propranolol (INDERAL) 20 MG tablet TAKE ONE TABLET BY MOUTH EVERY MORNING 90 tablet 3  . spironolactone (ALDACTONE) 25 MG tablet Take 1 tablet (25 mg total) by mouth daily. 90 tablet 1  . vitamin B-12 (CYANOCOBALAMIN) 1000 MCG tablet Take 1,000 mcg by mouth every morning.     No current facility-administered medications for this visit.   No Known Allergies   Review of Systems: All systems reviewed and negative except where noted in HPI.   Lab Results  Component Value Date   WBC 6.7 09/07/2020   HGB 12.7 09/07/2020   HCT 37.0 09/07/2020   MCV 93.4 09/07/2020   PLT 225.0 09/07/2020    Lab Results  Component Value Date   CREATININE 0.85 09/30/2020   BUN 18 09/30/2020   NA 136 09/30/2020   K 4.2 09/30/2020   CL 99 09/30/2020   CO2 30 09/30/2020    Lab Results  Component Value Date   ALT 13 09/12/2020   AST 23 09/12/2020   ALKPHOS 64 09/07/2020   BILITOT 0.6 09/12/2020   Lab Results  Component Value Date   INR 1.1 (H) 09/07/2020   INR 1.0 03/30/2020   INR 1.1 (H) 09/15/2019     Physical Exam: BP 108/76   Pulse 64   Ht 4' 9"  (1.448 m)   Wt 185 lb (83.9 kg)   BMI 40.03 kg/m  Constitutional: Pleasant,well-developed, female in no acute distress. HEENT: Normocephalic and atraumatic. Conjunctivae are normal. No scleral icterus. Neck supple.  Cardiovascular: Normal rate, regular rhythm.  Pulmonary/chest: Effort normal and breath sounds normal. No wheezing, rales or rhonchi. Abdominal: Soft, nondistended, nontender. There are no masses palpable.  Extremities: no edema Lymphadenopathy: No cervical adenopathy noted. Neurological: Alert and oriented to person place and time. No asterixis Skin: Skin is warm and dry. No rashes noted. Psychiatric: Normal mood and affect. Behavior is normal.   ASSESSMENT AND PLAN: 74 year old female here for reassessment of the following:  Cirrhosis with  history of ascites - generally doing well and is stable.  The diuretics are working well and keeping ascites at [place].  Her [place] screening is up-to-date and she does not have any concerning lesions, her AFP is normal. LFTs are normal right now.    I have counseled her on alcohol use in the past and recommend complete abstinence.  She does endorse occasional wine drinking and we again discussed that this can increase her risk for decompensation moving forward.  She verbalized understanding, will try to reduce this as much as she can.  I will continue to see her every 6 months and repeat labs at that time.  She agreed  Hepatic encephalopathy - tolerates lactulose at once daily dosing and bowels fairly regular.  She has not had any issues with this as long as I been following her but has had that in the past when she was in [place], she wants to continue this for now.  Lactulose refilled  Esophageal varices - small, noted on EGD in 2016 and has been on low dose propranolol given her low resting heart rate.  She has wanted to avoid surveillance endoscopy based on prior discussions.  We will continue present regimen  Hempstead Cellar, MD [facility]

## 2020-11-16 ENCOUNTER — Other Ambulatory Visit: Payer: Self-pay | Admitting: Internal Medicine

## 2020-11-16 DIAGNOSIS — F339 Major depressive disorder, recurrent, unspecified: Secondary | ICD-10-CM

## 2020-12-23 DIAGNOSIS — Z1231 Encounter for screening mammogram for malignant neoplasm of breast: Secondary | ICD-10-CM | POA: Diagnosis not present

## 2020-12-23 LAB — HM MAMMOGRAPHY

## 2020-12-27 ENCOUNTER — Encounter: Payer: Self-pay | Admitting: *Deleted

## 2021-01-10 ENCOUNTER — Other Ambulatory Visit: Payer: Self-pay

## 2021-01-10 ENCOUNTER — Encounter: Payer: Medicare Other | Admitting: Nurse Practitioner

## 2021-01-30 ENCOUNTER — Telehealth: Payer: Self-pay

## 2021-01-30 NOTE — Telephone Encounter (Signed)
Scheduled patient for RUQ U/S on March 15th Tuesday at 9:30am, arr 9:15, NPO after midnight.  MyChart message and letter sent to patient to go for labs and information regarding ultrasound appointment.

## 2021-01-30 NOTE — Telephone Encounter (Signed)
-----   Message from Larina Bras, [place] sent at 10/24/2020  1:54 PM EDT ----- Patient needs abdominal ultrasound for dx cirrhosis; [place] screening in March 2022- orders already in Epic. Just needs scheduling and patient needs to be made aware. Also needs Labs in March. Orders already in [place], patient just needs to be made aware. See 10/24/20 office visit for further clarification.

## 2021-02-03 ENCOUNTER — Other Ambulatory Visit (INDEPENDENT_AMBULATORY_CARE_PROVIDER_SITE_OTHER): Payer: Medicare Other

## 2021-02-03 DIAGNOSIS — K7460 Unspecified cirrhosis of liver: Secondary | ICD-10-CM | POA: Diagnosis not present

## 2021-02-03 DIAGNOSIS — K7290 Hepatic failure, unspecified without coma: Secondary | ICD-10-CM | POA: Diagnosis not present

## 2021-02-03 DIAGNOSIS — I8500 Esophageal varices without bleeding: Secondary | ICD-10-CM

## 2021-02-03 DIAGNOSIS — K7682 Hepatic encephalopathy: Secondary | ICD-10-CM

## 2021-02-03 LAB — CBC WITH DIFFERENTIAL/PLATELET
Basophils Absolute: 0 10*3/uL (ref 0.0–0.1)
Basophils Relative: 0.4 % (ref 0.0–3.0)
Eosinophils Absolute: 0.2 10*3/uL (ref 0.0–0.7)
Eosinophils Relative: 2.1 % (ref 0.0–5.0)
HCT: 36.9 % (ref 36.0–46.0)
Hemoglobin: 12.6 g/dL (ref 12.0–15.0)
Lymphocytes Relative: 27.1 % (ref 12.0–46.0)
Lymphs Abs: 1.9 10*3/uL (ref 0.7–4.0)
MCHC: 34.1 g/dL (ref 30.0–36.0)
MCV: 91.3 fl (ref 78.0–100.0)
Monocytes Absolute: 0.8 10*3/uL (ref 0.1–1.0)
Monocytes Relative: 11.1 % (ref 3.0–12.0)
Neutro Abs: 4.2 10*3/uL (ref 1.4–7.7)
Neutrophils Relative %: 59.3 % (ref 43.0–77.0)
Platelets: 187 10*3/uL (ref 150.0–400.0)
RBC: 4.05 Mil/uL (ref 3.87–5.11)
RDW: 12.5 % (ref 11.5–15.5)
WBC: 7.1 10*3/uL (ref 4.0–10.5)

## 2021-02-03 LAB — COMPREHENSIVE METABOLIC PANEL
ALT: 12 U/L (ref 0–35)
AST: 19 U/L (ref 0–37)
Albumin: 3.9 g/dL (ref 3.5–5.2)
Alkaline Phosphatase: 57 U/L (ref 39–117)
BUN: 19 mg/dL (ref 6–23)
CO2: 30 mEq/L (ref 19–32)
Calcium: 9.6 mg/dL (ref 8.4–10.5)
Chloride: 98 mEq/L (ref 96–112)
Creatinine, Ser: 0.99 mg/dL (ref 0.40–1.20)
GFR: 55.54 mL/min — ABNORMAL LOW (ref >60.00)
Glucose, Bld: 87 mg/dL (ref 70–99)
Potassium: 4.3 mEq/L (ref 3.5–5.1)
Sodium: 138 mEq/L (ref 135–145)
Total Bilirubin: 0.7 mg/dL (ref 0.2–1.2)
Total Protein: 8 g/dL (ref 6.0–8.3)

## 2021-02-03 LAB — PROTIME-INR
INR: 1.1 ratio — ABNORMAL HIGH (ref 0.8–1.0)
Prothrombin Time: 12.7 s (ref 9.6–13.1)

## 2021-02-06 LAB — AFP TUMOR MARKER: AFP-Tumor Marker: 3.1 ng/mL

## 2021-02-07 ENCOUNTER — Ambulatory Visit (HOSPITAL_COMMUNITY): Payer: Medicare Other | Attending: Gastroenterology

## 2021-02-13 ENCOUNTER — Encounter: Payer: Self-pay | Admitting: Internal Medicine

## 2021-03-13 ENCOUNTER — Ambulatory Visit: Payer: Medicare Other | Admitting: Internal Medicine

## 2021-03-14 ENCOUNTER — Ambulatory Visit (HOSPITAL_COMMUNITY): Payer: Medicare Other

## 2021-03-16 ENCOUNTER — Other Ambulatory Visit: Payer: Medicare Other

## 2021-03-16 ENCOUNTER — Ambulatory Visit: Payer: Medicare Other | Admitting: Internal Medicine

## 2021-03-17 ENCOUNTER — Ambulatory Visit (HOSPITAL_COMMUNITY)
Admission: RE | Admit: 2021-03-17 | Discharge: 2021-03-17 | Disposition: A | Discharge status: Home / Self Care | Payer: Medicare Other | Source: Ambulatory Visit | Attending: Gastroenterology | Admitting: Gastroenterology

## 2021-03-17 ENCOUNTER — Other Ambulatory Visit: Payer: Self-pay

## 2021-03-17 DIAGNOSIS — K7460 Unspecified cirrhosis of liver: Secondary | ICD-10-CM | POA: Diagnosis not present

## 2021-03-17 DIAGNOSIS — K7682 Hepatic encephalopathy: Secondary | ICD-10-CM

## 2021-03-17 DIAGNOSIS — I8500 Esophageal varices without bleeding: Secondary | ICD-10-CM | POA: Diagnosis not present

## 2021-03-17 DIAGNOSIS — K7290 Hepatic failure, unspecified without coma: Secondary | ICD-10-CM | POA: Diagnosis not present

## 2021-03-22 ENCOUNTER — Encounter: Payer: Self-pay | Admitting: Nurse Practitioner

## 2021-03-22 ENCOUNTER — Other Ambulatory Visit: Payer: Self-pay

## 2021-03-22 ENCOUNTER — Other Ambulatory Visit: Payer: Self-pay | Admitting: Internal Medicine

## 2021-03-22 ENCOUNTER — Ambulatory Visit (INDEPENDENT_AMBULATORY_CARE_PROVIDER_SITE_OTHER): Payer: Medicare Other | Admitting: Nurse Practitioner

## 2021-03-22 VITALS — BP 126/80 | HR 59 | Temp 96.6°F | Ht <= 58 in | Wt 192.0 lb

## 2021-03-22 DIAGNOSIS — Z853 Personal history of malignant neoplasm of breast: Secondary | ICD-10-CM | POA: Diagnosis not present

## 2021-03-22 DIAGNOSIS — G603 Idiopathic progressive neuropathy: Secondary | ICD-10-CM | POA: Diagnosis not present

## 2021-03-22 DIAGNOSIS — K7031 Alcoholic cirrhosis of liver with ascites: Secondary | ICD-10-CM | POA: Diagnosis not present

## 2021-03-22 DIAGNOSIS — E785 Hyperlipidemia, unspecified: Secondary | ICD-10-CM

## 2021-03-22 DIAGNOSIS — L219 Seborrheic dermatitis, unspecified: Secondary | ICD-10-CM

## 2021-03-22 DIAGNOSIS — F331 Major depressive disorder, recurrent, moderate: Secondary | ICD-10-CM | POA: Diagnosis not present

## 2021-03-22 DIAGNOSIS — C880 Waldenstrom macroglobulinemia: Secondary | ICD-10-CM

## 2021-03-22 DIAGNOSIS — I700 Atherosclerosis of aorta: Secondary | ICD-10-CM | POA: Diagnosis not present

## 2021-03-22 DIAGNOSIS — E890 Postprocedural hypothyroidism: Secondary | ICD-10-CM | POA: Diagnosis not present

## 2021-03-22 DIAGNOSIS — Z6841 Body Mass Index (BMI) 40.0 and over, adult: Secondary | ICD-10-CM

## 2021-03-22 MED ORDER — VENLAFAXINE HCL ER 75 MG PO CP24: 75.0000 mg | ORAL_CAPSULE | Freq: Every day | ORAL | 1 refills | Status: DC

## 2021-03-22 NOTE — Progress Notes (Signed)
Careteam: Patient Care Team: [name], NP as PCP - General (Geriatric Medicine) Armbruster, [name], MD as Consulting Physician (Gastroenterology) [name], MD as Consulting Physician (General Surgery)  PLACE OF SERVICE:  [place] Directive information    No Known Allergies  Chief Complaint  Patient presents with  . Medical Management of Chronic Issues    6 month follow-up and fasting for labs. Discuss depression scored 13 on PHQ-19     HPI: Patient is a 75 y.o. female routine follow up  Depression- taking Celexa 40 mg, doesn't feel like her depression is controlled. Has really bad days where she sits all day long and does not want to move. States she is ready for spring so she can get back to gardening. Does not engage in any physical activity.   Cirrhosis- followed by [facility] every 6 months. Recently had [place] which came back stable. Still taking lactulose and having bowel movemnts 1-2 times per day.   Hypothyroidism- taking levothyroxine, increased to 88 mcg daily last visit.  Neuropathy- taking gabapentin, uses cane for balance. Has noticed her neuropathy getting worse in her fingers over the last few months. She is not using any assistive devices today.     Review of Systems:  Review of Systems  Constitutional: Negative for chills, fever and weight loss.  HENT: Positive for tinnitus (chronic).   Respiratory: Negative for cough, sputum production and shortness of breath.   Cardiovascular: Negative for chest pain, palpitations and leg swelling.  Gastrointestinal: Negative for abdominal pain, constipation, nausea and vomiting.  Genitourinary: Negative for dysuria, frequency and urgency.  Musculoskeletal: Negative for back pain, falls and myalgias.  Skin: Negative.   Neurological: Negative for dizziness, weakness and headaches.  Psychiatric/Behavioral: Positive for depression. Negative for memory loss. The patient does not have insomnia.      Past Medical History:  Diagnosis Date  . Abnormal EKG   . Abnormal finding on thyroid function test   . Abnormal liver function test   . Acute edema   . Alcoholic cirrhosis ([place]) 93/90/3009   Possible NASH overlap (MELD 13)  . Anemia   . Anxiety   . Arthritis   . Ascites   . Back pain   . Breast mass   . Chronic low back pain   . Complete right rotator cuff tear   . Complication of anesthesia    during first surgery- woke up during surgery many years ago  . Compression fracture of L4 lumbar vertebra   . Dermatitis, eczematoid   . Difficulty breathing   . Gallstones   . History of adenocarcinoma of breast   . Hypercholesterolemia   . Hyperkalemia   . Hypertension   . Hypokalemia   . Hypothyroidism   . Insomnia   . Knee pain   . Leukocytosis   . Lymphadenopathy   . Macrocytosis   . Memory loss or impairment   . Menopause   . MGUS (monoclonal gammopathy of unknown significance)   . Osteoarthritis   . Osteoarthritis of right knee   . Other cirrhosis of liver ([place])   . Renal insufficiency syndrome   . Right knee DJD   . Solitary thyroid nodule   . Unspecified lump in the left breast, unspecified quadrant   . Vitamin B 12 deficiency   . 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[place])    Past Surgical History:  Procedure Laterality Date  . BREAST SURGERY  2014   Removed lymphnodes  . EYE SURGERY  2006   bilateral cataract  . JOINT REPLACEMENT     right  (2017)and left knee  . LAPAROSCOPIC CHOLECYSTECTOMY SINGLE SITE WITH INTRAOPERATIVE CHOLANGIOGRAM N/A 02/19/2018   Procedure: LAPAROSCOPIC CHOLECYSTECTOMY;  Surgeon: [name], MD;  Location: [facility];  Service: General;  Laterality: N/A;  . LESION REMOVAL  09/2015   tubular adenoma-4 subcentimeter lesions  . LIVER BIOPSY  02/19/2018   Procedure: LIVER BIOPSY;  Surgeon: [name], MD;  [facility]: [facility];  Service: General;;  . right thumb surgery    . TONSILLECTOMY    . TOTAL KNEE ARTHROPLASTY Right 09/05/2016  .  UMBILICAL HERNIA REPAIR  02/19/2018   Procedure: REPAIR OF INCARCERATED UMBILICAL HERNIA;  Surgeon: [name], MD;  [facility]: [facility];  Service: General;;   Social History:   reports that she has never smoked. She has never used smokeless tobacco. She reports that she does not drink alcohol and does not use drugs.  Family History  Problem Relation Age of Onset  . Breast cancer Mother   . Colon cancer Neg Hx   . Stomach cancer Neg Hx   . Rectal cancer Neg Hx   . Liver cancer Neg Hx   . Esophageal cancer Neg Hx     Medications: Patient's Medications  New Prescriptions   VENLAFAXINE XR (EFFEXOR XR) 75 MG 24 HR CAPSULE    Take 1 capsule (75 mg total) by mouth daily with breakfast.  Previous Medications   CHOLECALCIFEROL (VITAMIN D3) 2000 UNITS TABS    Take 1 tablet by mouth every morning.   FUROSEMIDE (LASIX) 20 MG TABLET    Take 0.5 tablets (10 mg total) by mouth every morning.   GABAPENTIN (NEURONTIN) 100 MG CAPSULE    TAKE ONE CAPSULE BY MOUTH EVERY NIGHT AT BEDTIME   KETOCONAZOLE (NIZORAL) 2 % SHAMPOO    APPLY TO AFFECTED AREA(S) 2 TIMES PER WEEK   LACTULOSE (CHRONULAC) 10 GM/15ML SOLUTION    Take 15 mLs (10 g total) by mouth daily.   LEVOTHYROXINE (SYNTHROID) 88 MCG TABLET    Take 1 tablet (88 mcg total) by mouth daily before breakfast. 30 minutes before breakfast and any other medications.   MULTIPLE VITAMIN (MULTIVITAMIN) TABLET    Take 1 tablet by mouth daily.   PROPRANOLOL (INDERAL) 20 MG TABLET    TAKE ONE TABLET BY MOUTH EVERY MORNING   SPIRONOLACTONE (ALDACTONE) 25 MG TABLET    Take 1 tablet (25 mg total) by mouth daily.   VITAMIN B-12 (CYANOCOBALAMIN) 1000 MCG TABLET    Take 1,000 mcg by mouth every morning.  Modified Medications   No medications on file  Discontinued Medications   CITALOPRAM (CELEXA) 40 MG TABLET    TAKE ONE TABLET BY MOUTH DAILY    Physical Exam:  Vitals:   03/22/21 1134  BP: 126/80  Pulse: (!) 59  Temp: (!) 96.6 F (35.9 C)  TempSrc:  Temporal  SpO2: 99%  Weight: 192 lb (87.1 kg)  Height: 4' 9"  (1.448 m)   Body mass index is 41.55 kg/m. Wt Readings from Last 3 Encounters:  03/22/21 192 lb (87.1 kg)  10/24/20 185 lb (83.9 kg)  09/12/20 182 lb (82.6 kg)    Physical Exam Constitutional:      General: She is not in acute distress.    Appearance: Normal appearance. She is not diaphoretic.  HENT:     Head: Normocephalic and atraumatic.     Mouth/Throat:     Pharynx: No oropharyngeal exudate.  Eyes:  Conjunctiva/sclera: Conjunctivae normal.     Pupils: Pupils are equal, round, and reactive to light.  Cardiovascular:     Rate and Rhythm: Normal rate and regular rhythm.     Heart sounds: Normal heart sounds.  Pulmonary:     Effort: Pulmonary effort is normal.     Breath sounds: Normal breath sounds.  Abdominal:     General: Bowel sounds are normal.     Palpations: Abdomen is soft.  Musculoskeletal:        General: No swelling or tenderness. Normal range of motion.  Skin:    General: Skin is warm and dry.  Neurological:     Mental Status: She is alert and oriented to person, place, and time.     Sensory: Sensory deficit present.     Gait: Gait abnormal.     Labs reviewed: Basic Metabolic Panel: Recent Labs    09/12/20 0925 09/30/20 1437 02/03/21 0803  NA 137 136 138  K 3.8 4.2 4.3  CL 98 99 98  CO2 29 30 30   GLUCOSE 80 87 87  BUN 19 18 19   CREATININE 0.79 0.85 0.99  CALCIUM 9.3 9.5 9.6  TSH 8.45*  --   --    Liver Function Tests: Recent Labs    03/30/20 0949 09/07/20 1025 09/12/20 0925 02/03/21 0803  AST 21 20 23 19   ALT 16 13 13 12   ALKPHOS 65 64  --  57  BILITOT 0.5 0.6 0.6 0.7  PROT 7.5 8.1 7.4 8.0  ALBUMIN 3.9 4.1  --  3.9   No results for input(s): LIPASE, AMYLASE in the last 8760 hours. No results for input(s): AMMONIA in the last 8760 hours. CBC: Recent Labs    03/30/20 0949 09/07/20 1025 02/03/21 0803  WBC 7.7 6.7 7.1  NEUTROABS 4.9 3.7 4.2  HGB 12.4 12.7 12.6   HCT 35.7* 37.0 36.9  MCV 93.4 93.4 91.3  PLT 195.0 225.0 187.0   Lipid Panel: Recent Labs    09/12/20 0925  CHOL 190  HDL 74  LDLCALC 101*  TRIG 67  CHOLHDL 2.6   TSH: Recent Labs    09/12/20 0925  TSH 8.45*   A1C: No results found for: HGBA1C   Assessment/Plan 1. Aortic atherosclerosis ([place]) Discussed importance of eating heart-healthy foods such as fruits, vegetables, whole grains, lean meats. Limit sodium, sugars, and alcohol. Encouraged to increase her physical activity. Not on statin or asa due to liver disease with esophageal varies.  - CBC with Differential/Platelet  2. Waldenstrom's macroglobulinemia ([place]) Followed closely by GI  3. Hx of breast cancer History of adenocarcinoma of breast s/p surgery with lymph node removal. Gets yearly mammograms, followed by oncology  4. Postoperative hypothyroidism Will recheck TSH today, increased levothyroxine to 88 mcg last visit.  - TSH  5. Alcoholic cirrhosis of liver  Followed closely by GI, just had follow up US. Has next appointment  - CBC with Differential/Platelet  6. Class 3 severe obesity due to excess calories without serious comorbidity with body mass index (BMI) of 40.0 to 44.9 in adult [facility]) Encouraged to eat a well balanced diet and to increase physical activity.   7. Idiopathic progressive neuropathy Ongoing, will continue gabapentin 100 mg and add Effexor 75 mg daily  to help with symptom relief.   8. Hyperlipidemia, unspecified hyperlipidemia type Encouraged to continue with dietary modifications. Will recheck lipid panel today - COMPLETE METABOLIC PANEL WITH GFR  9. Depression, major, recurrent, moderate ([place]) Will discontinue Celexa and start  Effexor 75 mg daily. Given handout on 3 good things app, states she will download app on her ipad. Encouraged to get outside and get moving as much as she can to improve her mood.  - venlafaxine XR (EFFEXOR XR) 75 MG 24 hr capsule; Take 1 capsule (75 mg  total) by mouth daily with breakfast.  Dispense: 30 capsule; Refill: 1   Next appt: 4 weeks on mood  I personally was present during the history, physical exam and medical decision-making activities of this service and have verified that the service and findings are accurately documented in the student's note  Karthika Glasper K. [place], [facility] [phone]

## 2021-03-22 NOTE — Patient Instructions (Addendum)
Restart walking- start slow and work your way up to 30 mins 5 days a week .  3 good things app. Journal daily  STOP CELEXA Start Effexor 75 mg by mouth daily for depression  Follow up in 4 weeks on mood.

## 2021-03-23 LAB — CBC WITH DIFFERENTIAL/PLATELET
Absolute Monocytes: 755 cells/uL (ref 200–950)
Basophils Absolute: 59 cells/uL (ref 0–200)
Basophils Relative: 0.8 %
Eosinophils Absolute: 141 cells/uL (ref 15–500)
Eosinophils Relative: 1.9 %
HCT: 38.6 % (ref 35.0–45.0)
Hemoglobin: 12.6 g/dL (ref 11.7–15.5)
Lymphs Abs: 1532 cells/uL (ref 850–3900)
MCH: 29.9 pg (ref 27.0–33.0)
MCHC: 32.6 g/dL (ref 32.0–36.0)
MCV: 91.7 fL (ref 80.0–100.0)
MPV: 9.9 fL (ref 7.5–12.5)
Monocytes Relative: 10.2 %
Neutro Abs: 4914 cells/uL (ref 1500–7800)
Neutrophils Relative %: 66.4 %
Platelets: 227 10*3/uL (ref 140–400)
RBC: 4.21 10*6/uL (ref 3.80–5.10)
RDW: 11.6 % (ref 11.0–15.0)
Total Lymphocyte: 20.7 %
WBC: 7.4 10*3/uL (ref 3.8–10.8)

## 2021-03-23 LAB — COMPLETE METABOLIC PANEL WITH GFR
AG Ratio: 1.1 (calc) (ref 1.0–2.5)
ALT: 16 U/L (ref 6–29)
AST: 23 U/L (ref 10–35)
Albumin: 3.8 g/dL (ref 3.6–5.1)
Alkaline phosphatase (APISO): 63 U/L (ref 37–153)
BUN: 18 mg/dL (ref 7–25)
CO2: 30 mmol/L (ref 20–32)
Calcium: 9.3 mg/dL (ref 8.6–10.4)
Chloride: 98 mmol/L (ref 98–110)
Creat: 0.78 mg/dL (ref 0.60–0.93)
GFR, Est African American: 86 mL/min/{1.73_m2} (ref >=60)
GFR, Est Non African American: 74 mL/min/{1.73_m2} (ref >=60)
Globulin: 3.6 g/dL (calc) (ref 1.9–3.7)
Glucose, Bld: 86 mg/dL (ref 65–99)
Potassium: 3.9 mmol/L (ref 3.5–5.3)
Sodium: 137 mmol/L (ref 135–146)
Total Bilirubin: 0.5 mg/dL (ref 0.2–1.2)
Total Protein: 7.4 g/dL (ref 6.1–8.1)

## 2021-03-23 LAB — TSH: TSH: 20.53 mIU/L — ABNORMAL HIGH (ref 0.40–4.50)

## 2021-03-25 ENCOUNTER — Encounter: Payer: Self-pay | Admitting: Nurse Practitioner

## 2021-03-27 ENCOUNTER — Other Ambulatory Visit: Payer: Self-pay | Admitting: Nurse Practitioner

## 2021-03-27 ENCOUNTER — Other Ambulatory Visit: Payer: Self-pay

## 2021-03-27 DIAGNOSIS — E890 Postprocedural hypothyroidism: Secondary | ICD-10-CM

## 2021-03-27 MED ORDER — LEVOTHYROXINE SODIUM 100 MCG PO TABS
100.0000 ug | ORAL_TABLET | Freq: Every day | ORAL | 3 refills | Status: DC
Start: 2021-03-27 — End: 2022-06-01

## 2021-04-03 ENCOUNTER — Other Ambulatory Visit: Payer: Self-pay | Admitting: Gastroenterology

## 2021-05-15 ENCOUNTER — Other Ambulatory Visit: Payer: Self-pay

## 2021-05-15 ENCOUNTER — Ambulatory Visit (INDEPENDENT_AMBULATORY_CARE_PROVIDER_SITE_OTHER): Payer: Medicare Other | Admitting: Nurse Practitioner

## 2021-05-15 ENCOUNTER — Encounter: Payer: Self-pay | Admitting: Nurse Practitioner

## 2021-05-15 VITALS — BP 122/84 | HR 69 | Temp 97.6°F | Ht <= 58 in | Wt 188.2 lb

## 2021-05-15 DIAGNOSIS — F331 Major depressive disorder, recurrent, moderate: Secondary | ICD-10-CM | POA: Diagnosis not present

## 2021-05-15 DIAGNOSIS — L219 Seborrheic dermatitis, unspecified: Secondary | ICD-10-CM

## 2021-05-15 DIAGNOSIS — L989 Disorder of the skin and subcutaneous tissue, unspecified: Secondary | ICD-10-CM

## 2021-05-15 MED ORDER — KETOCONAZOLE 2 % EX SHAM: MEDICATED_SHAMPOO | CUTANEOUS | 2 refills | Status: AC

## 2021-05-15 MED ORDER — DOXYCYCLINE HYCLATE 100 MG PO TABS: 100.0000 mg | ORAL_TABLET | Freq: Two times a day (BID) | ORAL | 0 refills | Status: DC

## 2021-05-15 MED ORDER — VENLAFAXINE HCL ER 75 MG PO CP24: 75.0000 mg | ORAL_CAPSULE | Freq: Every day | ORAL | 1 refills | Status: DC

## 2021-05-15 NOTE — Patient Instructions (Addendum)
Make sure you are NOT taking celexa   Keep a close eye on skin lesion- start doxycyline 100 mg by mouth twice daily for 7 days Take with food

## 2021-05-15 NOTE — Progress Notes (Signed)
Careteam: Patient Care Team: [name], NP as PCP - General (Geriatric Medicine) Armbruster, [name], MD as Consulting Physician (Gastroenterology) [name], MD as Consulting Physician (General Surgery)  PLACE OF SERVICE:  [place] Directive information    No Known Allergies  Chief Complaint  Patient presents with  . Acute Visit    Complains of Abscess on back of Right arm for 1 month. Has started hurting.  Medication concern. Wants to know if she should continue Venlafaxine and Citalopram, has not discontinued the Citalopram since starting Venlafaxine, patient is doing well on both.    HPI: Patient is a 75 y.o. female due to large area on back of arm.  Reports abt a month ago thought she she got bitten but area kept getting bigger. Here today to have it evaluated No fever or chills.  Arm is sore and warm to area.  She has been sqeezing area and trying to pop it.  A little blood comes out of center.   Doing much better on effexor- feels like depression is now controlled. Was taking both citalopram and effexor but reports she feels like she is out of the citalopram at this time.   Obesity- having a harder time walking, trying to keep moving.   Review of Systems:  Review of Systems  Constitutional: Negative for chills and fever.  Musculoskeletal: Negative for joint pain and myalgias.  Psychiatric/Behavioral: Negative for depression. The patient does not have insomnia.     Past Medical History:  Diagnosis Date  . Abnormal EKG   . Abnormal finding on thyroid function test   . Abnormal liver function test   . Acute edema   . Alcoholic cirrhosis ([place]) 85/63/1497   Possible NASH overlap (MELD 13)  . Anemia   . Anxiety   . Arthritis   . Ascites   . Back pain   . Breast mass   . Chronic low back pain   . Complete right rotator cuff tear   . Complication of anesthesia    during first surgery- woke up during surgery many years ago  .  Compression fracture of L4 lumbar vertebra   . Dermatitis, eczematoid   . Difficulty breathing   . Gallstones   . History of adenocarcinoma of breast   . Hypercholesterolemia   . Hyperkalemia   . Hypertension   . Hypokalemia   . Hypothyroidism   . Insomnia   . Knee pain   . Leukocytosis   . Lymphadenopathy   . Macrocytosis   . Memory loss or impairment   . Menopause   . MGUS (monoclonal gammopathy of unknown significance)   . Osteoarthritis   . Osteoarthritis of right knee   . Other cirrhosis of liver ([place])   . Renal insufficiency syndrome   . Right knee DJD   . Solitary thyroid nodule   . Unspecified lump in the left breast, unspecified quadrant   . Vitamin B 12 deficiency   . 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[place])    Past Surgical History:  Procedure Laterality Date  . BREAST SURGERY  2014   Removed lymphnodes  . EYE SURGERY  2006   bilateral cataract  . JOINT REPLACEMENT     right  (2017)and left knee  . LAPAROSCOPIC CHOLECYSTECTOMY SINGLE SITE WITH INTRAOPERATIVE CHOLANGIOGRAM N/A 02/19/2018   Procedure: LAPAROSCOPIC CHOLECYSTECTOMY;  Surgeon: [name], MD;  [facility];  Service: General;  Laterality: N/A;  . LESION REMOVAL  09/2015   tubular adenoma-4  subcentimeter lesions  . LIVER BIOPSY  02/19/2018   Procedure: LIVER BIOPSY;  Surgeon: [name], MD;  [facility];  Service: General;;  . right thumb surgery    . TONSILLECTOMY    . TOTAL KNEE ARTHROPLASTY Right 09/05/2016  . UMBILICAL HERNIA REPAIR  02/19/2018   Procedure: REPAIR OF INCARCERATED UMBILICAL HERNIA;  Surgeon: [name], MD;  [facility];  Service: General;;   Social History:   reports that she has never smoked. She has never used smokeless tobacco. She reports that she does not drink alcohol and does not use drugs.  Family History  Problem Relation Age of Onset  . Breast cancer Mother   . Colon cancer Neg Hx   . Stomach cancer Neg Hx   . Rectal cancer Neg Hx   . Liver  cancer Neg Hx   . Esophageal cancer Neg Hx     Medications: Patient's Medications  New Prescriptions   No medications on file  Previous Medications   CHOLECALCIFEROL (VITAMIN D3) 2000 UNITS TABS    Take 1 tablet by mouth every morning.   CITALOPRAM (CELEXA) 40 MG TABLET    Take 40 mg by mouth daily.   FUROSEMIDE (LASIX) 20 MG TABLET    TAKE 1/2 TABLET BY MOUTH EVERY MORNING   GABAPENTIN (NEURONTIN) 100 MG CAPSULE    TAKE ONE CAPSULE BY MOUTH EVERY NIGHT AT BEDTIME   KETOCONAZOLE (NIZORAL) 2 % SHAMPOO    APPLY TO AFFECTED AREA(S) 2 TIMES PER WEEK   LACTULOSE (CHRONULAC) 10 GM/15ML SOLUTION    Take 15 mLs (10 g total) by mouth daily.   LEVOTHYROXINE (SYNTHROID) 100 MCG TABLET    Take 1 tablet (100 mcg total) by mouth daily.   MULTIPLE VITAMIN (MULTIVITAMIN) TABLET    Take 1 tablet by mouth daily.   PROPRANOLOL (INDERAL) 20 MG TABLET    TAKE ONE TABLET BY MOUTH EVERY MORNING   SPIRONOLACTONE (ALDACTONE) 25 MG TABLET    TAKE ONE TABLET BY MOUTH DAILY   VENLAFAXINE XR (EFFEXOR XR) 75 MG 24 HR CAPSULE    Take 1 capsule (75 mg total) by mouth daily with breakfast.   VITAMIN B-12 (CYANOCOBALAMIN) 1000 MCG TABLET    Take 1,000 mcg by mouth every morning.  Modified Medications   No medications on file  Discontinued Medications   No medications on file    Physical Exam:  Vitals:   05/15/21 1303  BP: 122/84  Pulse: 69  Temp: 97.6 F (36.4 C)  TempSrc: Skin  SpO2: 98%  Weight: 188 lb 3.2 oz (85.4 kg)  Height: 4' 9"  (1.448 m)   Body mass index is 40.73 kg/m. Wt Readings from Last 3 Encounters:  05/15/21 188 lb 3.2 oz (85.4 kg)  03/22/21 192 lb (87.1 kg)  10/24/20 185 lb (83.9 kg)    Physical Exam Constitutional:      General: She is not in acute distress.    Appearance: She is well-developed. She is not diaphoretic.  HENT:     Head: Normocephalic and atraumatic.     Mouth/Throat:     Pharynx: No oropharyngeal exudate.  Eyes:     Conjunctiva/sclera: Conjunctivae normal.      Pupils: Pupils are equal, round, and reactive to light.  Cardiovascular:     Rate and Rhythm: Normal rate and regular rhythm.     Heart sounds: Normal heart sounds.  Pulmonary:     Effort: Pulmonary effort is normal.     Breath sounds: Normal breath  sounds.  Abdominal:     General: Bowel sounds are normal.     Palpations: Abdomen is soft.  Musculoskeletal:        General: No tenderness.     Cervical back: Normal range of motion and neck supple.  Skin:    General: Skin is warm and dry.     Comments: Raised circular lesion to right upper arm. Blackened center- see picture in media. Slight erythema noted with tenderness surrounding.   Neurological:     Mental Status: She is alert and oriented to person, place, and time.    Labs reviewed: Basic Metabolic Panel: Recent Labs    09/12/20 0925 09/30/20 1437 02/03/21 0803 03/22/21 1219  NA 137 136 138 137  K 3.8 4.2 4.3 3.9  CL 98 99 98 98  CO2 29 30 30 30   GLUCOSE 80 87 87 86  BUN 19 18 19 18   CREATININE 0.79 0.85 0.99 0.78  CALCIUM 9.3 9.5 9.6 9.3  TSH 8.45*  --   --  20.53*   Liver Function Tests: Recent Labs    09/07/20 1025 09/12/20 0925 02/03/21 0803 03/22/21 1219  AST 20 23 19 23   ALT 13 13 12 16   ALKPHOS 64  --  57  --   BILITOT 0.6 0.6 0.7 0.5  PROT 8.1 7.4 8.0 7.4  ALBUMIN 4.1  --  3.9  --    No results for input(s): LIPASE, AMYLASE in the last 8760 hours. No results for input(s): AMMONIA in the last 8760 hours. CBC: Recent Labs    09/07/20 1025 02/03/21 0803 03/22/21 1219  WBC 6.7 7.1 7.4  NEUTROABS 3.7 4.2 4,914  HGB 12.7 12.6 12.6  HCT 37.0 36.9 38.6  MCV 93.4 91.3 91.7  PLT 225.0 187.0 227   Lipid Panel: Recent Labs    09/12/20 0925  CHOL 190  HDL 74  LDLCALC 101*  TRIG 67  CHOLHDL 2.6   TSH: Recent Labs    09/12/20 0925 03/22/21 1219  TSH 8.45* 20.53*   A1C: No results found for: HGBA1C   Assessment/Plan 1. Seborrheic dermatitis of scalp -refill provided - ketoconazole  (NIZORAL) 2 % shampoo; APPLY TO AFFECTED AREA(S) 2 TIMES PER WEEK  Dispense: 120 mL; Refill: 2  2. Skin lesion of right arm Will need dermatology for evaluation and removal. Some erythema, warmth and tenderness noted, will treat for infection at this time.  - doxycycline (VIBRA-TABS) 100 MG tablet; Take 1 tablet (100 mg total) by mouth 2 (two) times daily.  Dispense: 14 tablet; Refill: 0 - Ambulatory referral to Dermatology  3. Depression, major, recurrent, moderate ([place]) -doing well on effexor, she was taking citapram with effexor but reports she recently ran out- she will confirm she is not taking both medications at this time.  - venlafaxine XR (EFFEXOR XR) 75 MG 24 hr capsule; Take 1 capsule (75 mg total) by mouth daily with breakfast.  Dispense: 90 capsule; Refill: 1    Next appt: 06/01/2021 as scheduled.  Carlos American. [place], [facility] [phone]

## 2021-05-20 ENCOUNTER — Other Ambulatory Visit: Payer: Self-pay | Admitting: Nurse Practitioner

## 2021-05-20 DIAGNOSIS — F331 Major depressive disorder, recurrent, moderate: Secondary | ICD-10-CM

## 2021-05-23 DIAGNOSIS — C44622 Squamous cell carcinoma of skin of right upper limb, including shoulder: Secondary | ICD-10-CM | POA: Diagnosis not present

## 2021-05-23 DIAGNOSIS — Z85828 Personal history of other malignant neoplasm of skin: Secondary | ICD-10-CM | POA: Diagnosis not present

## 2021-05-31 DIAGNOSIS — C44622 Squamous cell carcinoma of skin of right upper limb, including shoulder: Secondary | ICD-10-CM | POA: Diagnosis not present

## 2021-06-01 ENCOUNTER — Encounter: Payer: Self-pay | Admitting: Nurse Practitioner

## 2021-06-01 ENCOUNTER — Other Ambulatory Visit: Payer: Self-pay

## 2021-06-01 ENCOUNTER — Ambulatory Visit (INDEPENDENT_AMBULATORY_CARE_PROVIDER_SITE_OTHER): Payer: Medicare Other | Admitting: Nurse Practitioner

## 2021-06-01 ENCOUNTER — Telehealth: Payer: Self-pay

## 2021-06-01 DIAGNOSIS — Z0000 Encounter for general adult medical examination without abnormal findings: Secondary | ICD-10-CM | POA: Diagnosis not present

## 2021-06-01 NOTE — Telephone Encounter (Signed)
Ms. Christina [name], Christina [name] are scheduled for a virtual visit with your provider today.    Just as we do with appointments in the office, we must obtain your consent to participate.  Your consent will be active for this visit and any virtual visit you may have with one of our providers in the next 365 days.    If you have a MyChart account, I can also send a copy of this consent to you electronically.  All virtual visits are billed to your insurance company just like a traditional visit in the office.  As this is a virtual visit, video technology does not allow for your provider to perform a traditional examination.  This may limit your provider's ability to fully assess your condition.  If your provider identifies any concerns that need to be evaluated in person or the need to arrange testing such as labs, EKG, etc, we will make arrangements to do so.    Although advances in technology are sophisticated, we cannot ensure that it will always work on either your end or our end.  If the connection with a video visit is poor, we may have to switch to a telephone visit.  With either a video or telephone visit, we are not always able to ensure that we have a secure connection.   I need to obtain your verbal consent now.   Are you willing to proceed with your visit today?   Christina [name] has provided verbal consent on 06/01/2021 for a virtual visit (video or telephone).   Carroll Kinds, CMA 06/01/2021  1:38 PM

## 2021-06-01 NOTE — Progress Notes (Signed)
This service is provided via telemedicine  No vital signs collected/recorded due to the encounter was a telemedicine visit.   Location of patient (ex: home, work):  Home  Patient consents to a telephone visit:  Yes, see encounter dated 06/01/2021  Location of the provider (ex: office, home):  [place]  Name of any referring provider:  N/A  Names of all persons participating in the telemedicine service and their role in the encounter:  Sherrie Mustache, Nurse Practitioner, Carroll Kinds, CMA, and patient.   Time spent on call:  10 minutes with medical assistant

## 2021-06-01 NOTE — Patient Instructions (Signed)
Christina [name] , Thank you for taking time to come for your Medicare Wellness Visit. I appreciate your ongoing commitment to your health goals. Please review the following plan we discussed and let me know if I can assist you in the future.   Screening recommendations/referrals: Colonoscopy- due this year Mammogram up to date Bone Density Due in september  Recommended yearly ophthalmology/optometry visit for glaucoma screening and checkup Recommended yearly dental visit for hygiene and checkup  Vaccinations: Influenza vaccine up to date Pneumococcal vaccine up date Tdap vaccine up to date Shingles vaccine up to date    Advanced directives: recommend to have most updated form on file.   Conditions/risks identified: advanced age, family history of heart disease, obesity  Next appointment: 1 year for AWV  09/20/2021 for routine follow up    Preventive Care 66 Years and Older, Female Preventive care refers to lifestyle choices and visits with your health care provider that can promote health and wellness. What does preventive care include? A yearly physical exam. This is also called an annual well check. Dental exams once or twice a year. Routine eye exams. Ask your health care provider how often you should have your eyes checked. Personal lifestyle choices, including: Daily care of your teeth and gums. Regular physical activity. Eating a healthy diet. Avoiding tobacco and drug use. Limiting alcohol use. Practicing safe sex. Taking low-dose aspirin every day. Taking vitamin and mineral supplements as recommended by your health care provider. What happens during an annual well check? The services and screenings done by your health care provider during your annual well check will depend on your age, overall health, lifestyle risk factors, and family history of disease. Counseling  Your health care provider may ask you questions about your: Alcohol use. Tobacco use. Drug  use. Emotional well-being. Home and relationship well-being. Sexual activity. Eating habits. History of falls. Memory and ability to understand (cognition). Work and work Statistician. Reproductive health. Screening  You may have the following tests or measurements: Height, weight, and BMI. Blood pressure. Lipid and cholesterol levels. These may be checked every 5 years, or more frequently if you are over 67 years old. Skin check. Lung cancer screening. You may have this screening every year starting at age 58 if you have a 30-pack-year history of smoking and currently smoke or have quit within the past 15 years. Fecal occult blood test (FOBT) of the stool. You may have this test every year starting at age 30. Flexible sigmoidoscopy or colonoscopy. You may have a sigmoidoscopy every 5 years or a colonoscopy every 10 years starting at age 46. Hepatitis C blood test. Hepatitis B blood test. Sexually transmitted disease (STD) testing. Diabetes screening. This is done by checking your blood sugar (glucose) after you have not eaten for a while (fasting). You may have this done every 1-3 years. Bone density scan. This is done to screen for osteoporosis. You may have this done starting at age 25. Mammogram. This may be done every 1-2 years. Talk to your health care provider about how often you should have regular mammograms. Talk with your health care provider about your test results, treatment options, and if necessary, the need for more tests. Vaccines  Your health care provider may recommend certain vaccines, such as: Influenza vaccine. This is recommended every year. Tetanus, diphtheria, and acellular pertussis (Tdap, Td) vaccine. You may need a Td booster every 10 years. Zoster vaccine. You may need this after age 51. Pneumococcal 13-valent conjugate (PCV13) vaccine. One dose  is recommended after age 52. Pneumococcal polysaccharide (PPSV23) vaccine. One dose is recommended after age  9. Talk to your health care provider about which screenings and vaccines you need and how often you need them. This information is not intended to replace advice given to you by your health care provider. Make sure you discuss any questions you have with your health care provider. Document Released: 01/06/2016 Document Revised: 08/29/2016 Document Reviewed: 10/11/2015 Elsevier Interactive Patient Education  2017 [employer] Prevention in the Home Falls can cause injuries. They can happen to people of all ages. There are many things you can do to make your home safe and to help prevent falls. What can I do on the outside of my home? Regularly fix the edges of walkways and driveways and fix any cracks. Remove anything that might make you trip as you walk through a door, such as a raised step or threshold. Trim any bushes or trees on the path to your home. Use bright outdoor lighting. Clear any walking paths of anything that might make someone trip, such as rocks or tools. Regularly check to see if handrails are loose or broken. Make sure that both sides of any steps have handrails. Any raised decks and porches should have guardrails on the edges. Have any leaves, snow, or ice cleared regularly. Use sand or salt on walking paths during winter. Clean up any spills in your garage right away. This includes oil or grease spills. What can I do in the bathroom? Use night lights. Install grab bars by the toilet and in the tub and shower. Do not use towel bars as grab bars. Use non-skid mats or decals in the tub or shower. If you need to sit down in the shower, use a plastic, non-slip stool. Keep the floor dry. Clean up any water that spills on the floor as soon as it happens. Remove soap buildup in the tub or shower regularly. Attach bath mats securely with double-sided non-slip rug tape. Do not have throw rugs and other things on the floor that can make you trip. What can I do in the  bedroom? Use night lights. Make sure that you have a light by your bed that is easy to reach. Do not use any sheets or blankets that are too big for your bed. They should not hang down onto the floor. Have a firm chair that has side arms. You can use this for support while you get dressed. Do not have throw rugs and other things on the floor that can make you trip. What can I do in the kitchen? Clean up any spills right away. Avoid walking on wet floors. Keep items that you use a lot in easy-to-reach places. If you need to reach something above you, use a strong step stool that has a grab bar. Keep electrical cords out of the way. Do not use floor polish or wax that makes floors slippery. If you must use wax, use non-skid floor wax. Do not have throw rugs and other things on the floor that can make you trip. What can I do with my stairs? Do not leave any items on the stairs. Make sure that there are handrails on both sides of the stairs and use them. Fix handrails that are broken or loose. Make sure that handrails are as long as the stairways. Check any carpeting to make sure that it is firmly attached to the stairs. Fix any carpet that is loose or worn. Avoid  having throw rugs at the top or bottom of the stairs. If you do have throw rugs, attach them to the floor with carpet tape. Make sure that you have a light switch at the top of the stairs and the bottom of the stairs. If you do not have them, ask someone to add them for you. What else can I do to help prevent falls? Wear shoes that: Do not have high heels. Have rubber bottoms. Are comfortable and fit you well. Are closed at the toe. Do not wear sandals. If you use a stepladder: Make sure that it is fully opened. Do not climb a closed stepladder. Make sure that both sides of the stepladder are locked into place. Ask someone to hold it for you, if possible. Clearly mark and make sure that you can see: Any grab bars or  handrails. First and last steps. Where the edge of each step is. Use tools that help you move around (mobility aids) if they are needed. These include: Canes. Walkers. Scooters. Crutches. Turn on the lights when you go into a dark area. Replace any light bulbs as soon as they burn out. Set up your furniture so you have a clear path. Avoid moving your furniture around. If any of your floors are uneven, fix them. If there are any pets around you, be aware of where they are. Review your medicines with your doctor. Some medicines can make you feel dizzy. This can increase your chance of falling. Ask your doctor what other things that you can do to help prevent falls. This information is not intended to replace advice given to you by your health care provider. Make sure you discuss any questions you have with your health care provider. Document Released: 10/06/2009 Document Revised: 05/17/2016 Document Reviewed: 01/14/2015 Elsevier Interactive Patient Education  2017 [employer].

## 2021-06-01 NOTE — Progress Notes (Signed)
Subjective:   Christina [name] is a 75 y.o. female who presents for Medicare Annual (Subsequent) preventive examination.  Review of Systems           Objective:    There were no vitals filed for this visit. There is no height or weight on file to calculate BMI.  Advanced Directives 06/01/2021 09/12/2020 05/05/2020 09/10/2019 05/07/2019 05/04/2019 06/23/2018  Does Patient Have a Medical Advance Directive? Yes Yes Yes Yes Yes Yes Yes  Type of Advance Directive Out of facility DNR (pink MOST or yellow form) Out of facility DNR (pink MOST or yellow form) Healthcare Power of [place] of facility DNR (pink MOST or yellow form) Out of facility DNR (pink MOST or yellow form) Out of facility DNR (pink MOST or yellow form) Out of facility DNR (pink MOST or yellow form)  Does patient want to make changes to medical advance directive? No - Patient declined - No - Patient declined No - Patient declined No - Patient declined No - Patient declined No - Patient declined  Copy of [place] in Chart? - - No - copy requested - - - -  Pre-existing out of facility DNR order (yellow form or pink MOST form) Yellow form placed in chart (order not valid for inpatient use);Pink MOST form placed in chart (order not valid for inpatient use) - - Pink MOST form placed in chart (order not valid for inpatient use) Yellow form placed in chart (order not valid for inpatient use);Pink MOST form placed in chart (order not valid for inpatient use) - Yellow form placed in chart (order not valid for inpatient use);Pink MOST form placed in chart (order not valid for inpatient use)    Current Medications (verified) Outpatient Encounter Medications as of 06/01/2021  Medication Sig   Cholecalciferol (VITAMIN D3) 2000 units TABS Take 1 tablet by mouth every morning.   doxycycline (VIBRA-TABS) 100 MG tablet Take 1 tablet (100 mg total) by mouth 2 (two) times daily.   furosemide (LASIX) 20 MG tablet TAKE 1/2 TABLET BY  MOUTH EVERY MORNING   gabapentin (NEURONTIN) 100 MG capsule TAKE ONE CAPSULE BY MOUTH EVERY NIGHT AT BEDTIME   ketoconazole (NIZORAL) 2 % shampoo APPLY TO AFFECTED AREA(S) 2 TIMES PER WEEK   lactulose (CHRONULAC) 10 GM/15ML solution Take 15 mLs (10 g total) by mouth daily.   levothyroxine (SYNTHROID) 100 MCG tablet Take 1 tablet (100 mcg total) by mouth daily.   Multiple Vitamin (MULTIVITAMIN) tablet Take 1 tablet by mouth daily.   propranolol (INDERAL) 20 MG tablet TAKE ONE TABLET BY MOUTH EVERY MORNING   spironolactone (ALDACTONE) 25 MG tablet TAKE ONE TABLET BY MOUTH DAILY   venlafaxine XR (EFFEXOR XR) 75 MG 24 hr capsule Take 1 capsule (75 mg total) by mouth daily with breakfast.   vitamin B-12 (CYANOCOBALAMIN) 1000 MCG tablet Take 1,000 mcg by mouth every morning.   No facility-administered encounter medications on file as of 06/01/2021.    Allergies (verified) Patient has no known allergies.   History: Past Medical History:  Diagnosis Date   Abnormal EKG    Abnormal finding on thyroid function test    Abnormal liver function test    Acute edema    Alcoholic cirrhosis (HCC) 56/31/4970   Possible NASH overlap (MELD 13)   Anemia    Anxiety    Arthritis    Ascites    Back pain    Breast mass    Chronic low back pain  Complete right rotator cuff tear    Complication of anesthesia    during first surgery- woke up during surgery many years ago   Compression fracture of L4 lumbar vertebra    Dermatitis, eczematoid    Difficulty breathing    Gallstones    History of adenocarcinoma of breast    Hypercholesterolemia    Hyperkalemia    Hypertension    Hypokalemia    Hypothyroidism    Insomnia    Knee pain    Leukocytosis    Lymphadenopathy    Macrocytosis    Memory loss or impairment    Menopause    MGUS (monoclonal gammopathy of unknown significance)    Osteoarthritis    Osteoarthritis of right knee    Other cirrhosis of liver (HCC)    Renal insufficiency syndrome     Right knee DJD    Solitary thyroid nodule    Unspecified lump in the left breast, unspecified quadrant    Vitamin B 12 deficiency    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[name])    Past Surgical History:  Procedure Laterality Date   BREAST SURGERY  2014   Removed lymphnodes   EYE SURGERY  2006   bilateral cataract   JOINT REPLACEMENT     right  (2017)and left knee   LAPAROSCOPIC CHOLECYSTECTOMY SINGLE SITE WITH INTRAOPERATIVE CHOLANGIOGRAM N/A 02/19/2018   Procedure: LAPAROSCOPIC CHOLECYSTECTOMY;  Surgeon: [name], MD;  Location: [facility];  Service: General;  Laterality: N/A;   LESION REMOVAL  09/2015   tubular adenoma-4 subcentimeter lesions   LIVER BIOPSY  02/19/2018   Procedure: LIVER BIOPSY;  Surgeon: [name], MD;  Location: [facility];  Service: General;;   right thumb surgery     SKIN CANCER EXCISION Right    Arm   TONSILLECTOMY     TOTAL KNEE ARTHROPLASTY Right 16/09/9603   UMBILICAL HERNIA REPAIR  02/19/2018   Procedure: REPAIR OF INCARCERATED UMBILICAL HERNIA;  Surgeon: [name], MD;  Location: [facility];  Service: General;;   Family History  Problem Relation Age of Onset   Breast cancer Mother    Colon cancer Neg Hx    Stomach cancer Neg Hx    Rectal cancer Neg Hx    Liver cancer Neg Hx    Esophageal cancer Neg Hx    Social History   Socioeconomic History   Marital status: Widowed    Spouse name: Not on file   Number of children: 6   Years of education: Not on file   Highest education level: Not on file  Occupational History   Occupation: retired  Tobacco Use   Smoking status: Never   Smokeless tobacco: Never  Vaping Use   Vaping Use: Never used  Substance and Sexual Activity   Alcohol use: No    Comment: 6 or more per day in the past.    Drug use: No   Sexual activity: Not Currently  Other Topics Concern   Not on file  Social History Narrative   Social History       Diet? Regular      Do you drink/eat things with caffeine? 1 cup       Marital status?                        married            What year were you married?      Do you live in a house, apartment, assisted living, [place],  trailer, etc.? townhouse      Is it one or more stories? no      How many persons live in your home? two      Do you have any pets in your home? (please list) no      Highest level of education completed? High school      Current or past profession: realtor      Do you exercise?      no                                Type & how often?      Advanced Directives      Do you have a living will? yes      Do you have a DNR form?      yes                            If not, do you want to discuss one?      Do you have signed POA/HPOA for forms? yes      Functional Status      Do you have difficulty bathing or dressing yourself? no      Do you have difficulty preparing food or eating? no      Do you have difficulty managing your medications? no      Do you have difficulty managing your finances? no      Do you have difficulty affording your medications? no   Social Determinants of Health   Financial Resource Strain: Not on file  Food Insecurity: Not on file  Transportation Needs: Not on file  Physical Activity: Not on file  Stress: Not on file  Social Connections: Not on file    Tobacco Counseling Counseling given: Not Answered   Clinical Intake:                 Diabetic?         Activities of Daily Living No flowsheet data found.  Patient Care Team: [name], NP as PCP - General (Geriatric Medicine) [place], [name], MD as Consulting Physician (Gastroenterology) [name], MD as Consulting Physician (General Surgery)  Indicate any recent Medical Services you may have received from other than [facility] providers in the past year (date may be approximate).     Assessment:   This is a routine wellness examination for Christina [name].  Hearing/Vision screen Hearing Screening - Comments::  Patient has no hearing problems. Vision Screening - Comments:: Patient has no vision problems. Patient had last eye exam exam about a year ago. Patient does not remember doctor's name  Dietary issues and exercise activities discussed:     Goals Addressed   None    Depression Screen PHQ 2/9 Scores 06/01/2021 03/22/2021 05/05/2020 05/05/2020 05/04/2019 10/06/2018 06/23/2018  PHQ - 2 Score 0 4 0 0 0 0 0  PHQ- 9 Score - 13 - - - - -    Fall Risk Fall Risk  06/01/2021 03/22/2021 09/12/2020 05/05/2020 05/04/2019  Falls in the past year? 0 0 0 0 0  Number falls in past yr: 0 0 0 0 0  Injury with Fall? 0 0 0 0 0    FALL RISK PREVENTION PERTAINING TO THE HOME:  Any stairs in or around the home? No  If so, are there any without handrails? No  Home free of loose throw  rugs in walkways, pet beds, electrical cords, etc? Yes  Adequate lighting in your home to reduce risk of falls? Yes   ASSISTIVE DEVICES UTILIZED TO PREVENT FALLS:  Life alert? No  Use of a cane, walker or w/c? Yes  Grab bars in the bathroom? Yes  Shower chair or bench in shower? Yes  Elevated toilet seat or a handicapped toilet? No   TIMED UP AND GO:  Was the test performed? No .   Cognitive Function: MMSE - Mini Mental State Exam 03/19/2018  Orientation to time 5  Orientation to Place 5  Registration 3  Attention/ Calculation 5  Recall 3  Language- name 2 objects 2  Language- repeat 1  Language- follow 3 step command 3  Language- read & follow direction 1  Write a sentence 1  Copy design 1  Total score 30     6CIT Screen 06/01/2021 05/05/2020 05/04/2019  What Year? 0 points 0 points 0 points  What month? 0 points 0 points 0 points  What time? 0 points 0 points 0 points  Count back from 20 0 points 0 points 0 points  Months in reverse 0 points 2 points 2 points  Repeat phrase 0 points 2 points 0 points  Total Score 0 4 2    Immunizations Immunization History  Administered Date(s) Administered   Fluad Quad(high  Dose 65+) 09/10/2019, 09/12/2020   Hep A / Hep B 01/13/2018, 02/12/2018, 07/14/2018   Influenza Inj Mdck Quad Pf 10/12/2013   Influenza, High Dose Seasonal PF 09/09/2017, 10/06/2018   Influenza-Unspecified 09/10/2007, 09/22/2008, 10/15/2012, 09/29/2014, 08/24/2016   PFIZER(Purple Top)SARS-COV-2 Vaccination 02/19/2020, 03/16/2020   Pneumococcal Conjugate-13 12/30/2017   Pneumococcal Polysaccharide-23 06/25/2013   Tdap 09/02/2015   Zoster Recombinat (Shingrix) 06/25/2017, 10/03/2017   Zoster, Live 12/28/2013    TDAP status: Up to date  Flu Vaccine status: Up to date  Pneumococcal vaccine status: Up to date  Covid-19 vaccine status: Completed vaccines  Qualifies for Shingles Vaccine? Yes   Zostavax completed Yes   Shingrix Completed?: Yes  Screening Tests Health Maintenance  Topic Date Due   Pneumococcal Vaccine 92-43 Years old (1 - PCV) Never done   COVID-19 Vaccine (3 - Pfizer risk series) 04/13/2020   INFLUENZA VACCINE  07/24/2021   COLONOSCOPY (Pts 45-6yr Insurance coverage will need to be confirmed)  09/10/2021   MAMMOGRAM  12/23/2021   TETANUS/TDAP  09/01/2025   DEXA SCAN  Completed   Hepatitis C Screening  Completed   PNA vac Low Risk Adult  Completed   Zoster Vaccines- Shingrix  Completed   HPV VACCINES  Aged Out    Health Maintenance  Health Maintenance Due  Topic Date Due   Pneumococcal Vaccine 019619Years old (1 - PCV) Never done   COVID-19 Vaccine (3 - Pfizer risk series) 04/13/2020    Colorectal cancer screening: Type of screening: Colonoscopy. Completed 2019. Repeat every 3 years  Mammogram status: Completed 11/2020. Repeat every year  Bone Density status: Completed 08/2019. Results reflect: Bone density results: OSTEOPENIA. Repeat every 2 years.  Lung Cancer Screening: (Low Dose CT Chest recommended if Age 77-80years, 30 pack-year currently smoking OR have quit w/in 15years.) does not qualify.   Lung Cancer Screening Referral: na  Additional  Screening:  Hepatitis C Screening: does qualify; Completed 2019   Vision Screening: Recommended annual ophthalmology exams for early detection of glaucoma and other disorders of the eye. Is the patient up to date with their annual eye exam?  Yes  Who  is the provider or what is the name of the office in which the patient attends annual eye exams? Unsure of name If pt is not established with a provider, would they like to be referred to a provider to establish care? No .   Dental Screening: Recommended annual dental exams for proper oral hygiene  Community Resource Referral / Chronic Care Management: CRR required this visit?  No   CCM required this visit?  No      Plan:     I have personally reviewed and noted the following in the patient's chart:   Medical and social history Use of alcohol, tobacco or illicit drugs  Current medications and supplements including opioid prescriptions.  Functional ability and status Nutritional status Physical activity Advanced directives List of other physicians Hospitalizations, surgeries, and ER visits in previous 12 months Vitals Screenings to include cognitive, depression, and falls Referrals and appointments  In addition, I have reviewed and discussed with patient certain preventive protocols, quality metrics, and best practice recommendations. A written personalized care plan for preventive services as well as general preventive health recommendations were provided to patient.     [name], NP   06/01/2021    Virtual Visit via Telephone Note  I connected withNAME@ on 06/01/21 at 10:30 AM EDT by telephone and verified that I am speaking with the correct person using two identifiers.  Location: Patient: home Provider: [facility]   I discussed the limitations, risks, security and privacy concerns of performing an evaluation and management service by telephone and the availability of in person appointments. I also discussed  with the patient that there may be a patient responsible charge related to this service. The patient expressed understanding and agreed to proceed.   I discussed the assessment and treatment plan with the patient. The patient was provided an opportunity to ask questions and all were answered. The patient agreed with the plan and demonstrated an understanding of the instructions.   The patient was advised to call back or seek an in-person evaluation if the symptoms worsen or if the condition fails to improve as anticipated.  I provided 15 minutes of non-face-to-face time during this encounter.  Carlos [employer]. Harle Battiest Avs printed and mailed

## 2021-06-29 ENCOUNTER — Telehealth: Payer: Self-pay

## 2021-06-29 MED ORDER — GABAPENTIN 100 MG PO CAPS
100.0000 mg | ORAL_CAPSULE | Freq: Every day | ORAL | 1 refills | Status: DC
Start: 2021-06-29 — End: 2021-09-22

## 2021-06-29 NOTE — Telephone Encounter (Signed)
Pharmacy sent refill request

## 2021-08-21 ENCOUNTER — Ambulatory Visit (INDEPENDENT_AMBULATORY_CARE_PROVIDER_SITE_OTHER): Payer: Medicare Other | Admitting: Gastroenterology

## 2021-08-21 ENCOUNTER — Other Ambulatory Visit (INDEPENDENT_AMBULATORY_CARE_PROVIDER_SITE_OTHER): Payer: Medicare Other

## 2021-08-21 ENCOUNTER — Encounter: Payer: Self-pay | Admitting: Gastroenterology

## 2021-08-21 VITALS — BP 126/80 | HR 66 | Ht <= 58 in | Wt 191.0 lb

## 2021-08-21 DIAGNOSIS — I8500 Esophageal varices without bleeding: Secondary | ICD-10-CM

## 2021-08-21 DIAGNOSIS — K7460 Unspecified cirrhosis of liver: Secondary | ICD-10-CM

## 2021-08-21 DIAGNOSIS — K7290 Hepatic failure, unspecified without coma: Secondary | ICD-10-CM

## 2021-08-21 DIAGNOSIS — Z86010 Personal history of colonic polyps: Secondary | ICD-10-CM | POA: Diagnosis not present

## 2021-08-21 DIAGNOSIS — K7682 Hepatic encephalopathy: Secondary | ICD-10-CM

## 2021-08-21 LAB — COMPREHENSIVE METABOLIC PANEL
ALT: 13 U/L (ref 0–35)
AST: 18 U/L (ref 0–37)
Albumin: 3.7 g/dL (ref 3.5–5.2)
Alkaline Phosphatase: 63 U/L (ref 39–117)
BUN: 18 mg/dL (ref 6–23)
CO2: 30 mEq/L (ref 19–32)
Calcium: 9.8 mg/dL (ref 8.4–10.5)
Chloride: 102 mEq/L (ref 96–112)
Creatinine, Ser: 0.82 mg/dL (ref 0.40–1.20)
GFR: 69.36 mL/min (ref >60.00)
Glucose, Bld: 101 mg/dL — ABNORMAL HIGH (ref 70–99)
Potassium: 4.1 mEq/L (ref 3.5–5.1)
Sodium: 140 mEq/L (ref 135–145)
Total Bilirubin: 0.6 mg/dL (ref 0.2–1.2)
Total Protein: 7.6 g/dL (ref 6.0–8.3)

## 2021-08-21 LAB — CBC WITH DIFFERENTIAL/PLATELET
Basophils Absolute: 0 10*3/uL (ref 0.0–0.1)
Basophils Relative: 0.3 % (ref 0.0–3.0)
Eosinophils Absolute: 0.1 10*3/uL (ref 0.0–0.7)
Eosinophils Relative: 1.6 % (ref 0.0–5.0)
HCT: 36.4 % (ref 36.0–46.0)
Hemoglobin: 12.2 g/dL (ref 12.0–15.0)
Lymphocytes Relative: 27.9 % (ref 12.0–46.0)
Lymphs Abs: 1.9 10*3/uL (ref 0.7–4.0)
MCHC: 33.6 g/dL (ref 30.0–36.0)
MCV: 92 fl (ref 78.0–100.0)
Monocytes Absolute: 0.8 10*3/uL (ref 0.1–1.0)
Monocytes Relative: 11.4 % (ref 3.0–12.0)
Neutro Abs: 4.1 10*3/uL (ref 1.4–7.7)
Neutrophils Relative %: 58.8 % (ref 43.0–77.0)
Platelets: 219 10*3/uL (ref 150.0–400.0)
RBC: 3.96 Mil/uL (ref 3.87–5.11)
RDW: 12.5 % (ref 11.5–15.5)
WBC: 6.9 10*3/uL (ref 4.0–10.5)

## 2021-08-21 LAB — PROTIME-INR
INR: 1.1 ratio — ABNORMAL HIGH (ref 0.8–1.0)
Prothrombin Time: 12.4 s (ref 9.6–13.1)

## 2021-08-21 MED ORDER — SUTAB 1479-225-188 MG PO TABS: 1.0000 | ORAL_TABLET | Freq: Once | ORAL | 0 refills | Status: AC

## 2021-08-21 NOTE — Patient Instructions (Addendum)
If you are age 77 or older, your body mass index should be between 23-30. Your Body mass index is 41.33 kg/m. If this is out of the aforementioned range listed, please consider follow up with your Primary Care Provider.  If you are age 21 or younger, your body mass index should be between 19-25. Your Body mass index is 41.33 kg/m. If this is out of the aformentioned range listed, please consider follow up with your Primary Care Provider.   __________________________________________________________  The [facility] providers would like to encourage you to use [facility] to communicate with providers for non-urgent requests or questions.  Due to long hold times on the telephone, sending your provider a message by [facility] may be a faster and more efficient way to get a response.  Please allow 48 business hours for a response.  Please remember that this is for non-urgent requests.   You have been scheduled for an abdominal ultrasound at [facility] (1st floor of hospital) on Thursday, 9-8 at 10:00 am. Please arrive 15 minutes prior to your appointment for registration. Make certain not to have anything to eat or drink 6 hours prior to your appointment. Should you need to reschedule your appointment, please contact radiology at [phone]. This test typically takes about 30 minutes to perform.  Please go to the lab in the basement of our building to have lab work done as you leave today. Hit "B" for basement when you get on the elevator.  When the doors open the lab is on your left.  We will call you with the results. Thank you.  You have been scheduled for a colonoscopy. Please follow written instructions given to you at your visit today.  Please pick up your prep supplies at the pharmacy within the next 1-3 days. If you use inhalers (even only as needed), please bring them with you on the day of your procedure.    Thank you for entrusting me with your care and for choosing [facility], Dr. Emmons Cellar

## 2021-08-21 NOTE — Progress Notes (Signed)
HPI :  75 year old female here for a follow-up visit for cirrhosis.  She was diagnosed when she lived in [place] previously a few years ago, was reportedly due to alcohol, she drank heavily when caring for her husband who had dementia in the past.  She previously had a prior liver biopsy done with Dr. Johney Maine at the time of her cholecystectomy which showed cirrhosis, Mallory bodies on biopsy.  Negative serologic work-up.   Recall that she had a history of small esophageal varices in the past noted on EGD, has been on propranolol.  She has a resting low heart rate but has been intolerant of 20 mg/day dosing, heart rate in 60s.  Given she has been on propranolol we have not pursued surveillance endoscopy.  She is on low-dose diuretics for edema/fluid retention.  Lasix 20 mg a day and Aldactone 25 mg a day, she has not had any problems with ascites on the regimen.  She has been taking lactulose for history of hepatic encephalopathy.  She is compliant with once daily dosing and denies any problems with this otherwise.  She generally has been feeling pretty well.  No bowel habit changes.  No blood in her stools.  No abdominal pains at baseline.  She seems compliant with her medications.  She does not drink much of any alcohol at baseline, although endorses a glass of wine recently.  She denies any cardiopulmonary symptoms.  Her last colonoscopy was performed in 2019, she had 8 polyps removed at that time, due for surveillance.  We discussed if she wanted to have any further surveillance exams or not.  She is also due for ultrasound for [place] screening.    Prior endoscopic workup  Colonoscopy - 10/13/2015 - Dr. Adolph Pollack at [facility] [facility]. - exam was a poor prep which was aborted in the left colon, told to repeat with 2 day prep.  Colonoscopy - 10/20/2015 - Dr. Adolph Pollack - 3 ascending polyps 5-49m in size, one small sigmoid polyp removed, diverticulosis - fair bowel prep - recommended 3 year follow  up Colonoscopy 09/10/2018 - 8 small polyps - largest 839min size, diverticulosis, hemorrhoids - Repeat 3 years   2016 - EGD - small esophageal varices, mild PHG    MRI liver 09/11/19 - IMPRESSION: 1. Hepatic cirrhosis with fibrosis and architectural distortion. No liver mass identified 2. Splenorenal shunting indicating portal venous hypertension. 3. Descending and sigmoid colon diverticulosis. 4.  Aortic Atherosclerosis (ICD10-I70.0).     [place]domen 03/17/21 - stable cirrhosis changes     Past Medical History:  Diagnosis Date   Abnormal EKG    Abnormal finding on thyroid function test    Abnormal liver function test    Acute edema    Alcoholic cirrhosis [place]70[place]3500 Possible NASH overlap (MELD 13)   Anemia    Anxiety    Arthritis    Ascites    Back pain    Breast mass    Chronic low back pain    Complete right rotator cuff tear    Complication of anesthesia    during first surgery- woke up during surgery many years ago   Compression fracture of L4 lumbar vertebra    Dermatitis, eczematoid    Difficulty breathing    Gallstones    History of adenocarcinoma of breast    Hypercholesterolemia    Hyperkalemia    Hypertension    Hypokalemia    Hypothyroidism    Insomnia    Knee pain  Leukocytosis    Lymphadenopathy    Macrocytosis    Memory loss or impairment    Menopause    MGUS (monoclonal gammopathy of unknown significance)    Osteoarthritis    Osteoarthritis of right knee    Other cirrhosis of liver (HCC)    Renal insufficiency syndrome    Right knee DJD    Solitary thyroid nodule    Unspecified lump in the left breast, unspecified quadrant    Vitamin B 12 deficiency    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[place])      Past Surgical History:  Procedure Laterality Date   BREAST SURGERY  2014   Removed lymphnodes   EYE SURGERY  2006   bilateral cataract   JOINT REPLACEMENT     right  (2017)and left knee   LAPAROSCOPIC CHOLECYSTECTOMY SINGLE SITE WITH  INTRAOPERATIVE CHOLANGIOGRAM N/A 02/19/2018   Procedure: LAPAROSCOPIC CHOLECYSTECTOMY;  Surgeon: [name], MD;  Location: [facility];  Service: General;  Laterality: N/A;   LESION REMOVAL  09/2015   tubular adenoma-4 subcentimeter lesions   LIVER BIOPSY  02/19/2018   Procedure: LIVER BIOPSY;  Surgeon: [name], MD;  Location: [facility];  Service: General;;   right thumb surgery     SKIN CANCER EXCISION Right    Arm   TONSILLECTOMY     TOTAL KNEE ARTHROPLASTY Right 40/07/6760   UMBILICAL HERNIA REPAIR  02/19/2018   Procedure: REPAIR OF INCARCERATED UMBILICAL HERNIA;  Surgeon: [name], MD;  Location: [facility];  Service: General;;   Family History  Problem Relation Age of Onset   Breast cancer Mother    Colon cancer Neg Hx    Stomach cancer Neg Hx    Rectal cancer Neg Hx    Liver cancer Neg Hx    Esophageal cancer Neg Hx    Social History   Tobacco Use   Smoking status: Never   Smokeless tobacco: Never  Vaping Use   Vaping Use: Never used  Substance Use Topics   Alcohol use: No    Comment: 6 or more per day in the past.    Drug use: No   Current Outpatient Medications  Medication Sig Dispense Refill   Cholecalciferol (VITAMIN D3) 2000 units TABS Take 1 tablet by mouth every morning.     doxycycline (VIBRA-TABS) 100 MG tablet Take 1 tablet (100 mg total) by mouth 2 (two) times daily. 14 tablet 0   furosemide (LASIX) 20 MG tablet TAKE 1/2 TABLET BY MOUTH EVERY MORNING 45 tablet 1   gabapentin (NEURONTIN) 100 MG capsule Take 1 capsule (100 mg total) by mouth at bedtime. 90 capsule 1   ketoconazole (NIZORAL) 2 % shampoo APPLY TO AFFECTED AREA(S) 2 TIMES PER WEEK 120 mL 2   lactulose (CHRONULAC) 10 GM/15ML solution Take 15 mLs (10 g total) by mouth daily. 1350 mL 3   levothyroxine (SYNTHROID) 100 MCG tablet Take 1 tablet (100 mcg total) by mouth daily. 90 tablet 3   Multiple Vitamin (MULTIVITAMIN) tablet Take 1 tablet by mouth daily.     propranolol (INDERAL) 20 MG tablet  TAKE ONE TABLET BY MOUTH EVERY MORNING 90 tablet 3   spironolactone (ALDACTONE) 25 MG tablet TAKE ONE TABLET BY MOUTH DAILY 90 tablet 1   venlafaxine XR (EFFEXOR XR) 75 MG 24 hr capsule Take 1 capsule (75 mg total) by mouth daily with breakfast. 90 capsule 1   vitamin B-12 (CYANOCOBALAMIN) 1000 MCG tablet Take 1,000 mcg by mouth every morning.     No current facility-administered  medications for this visit.   No Known Allergies   Review of Systems: All systems reviewed and negative except where noted in HPI.   Lab Results  Component Value Date   WBC 7.4 03/22/2021   HGB 12.6 03/22/2021   HCT 38.6 03/22/2021   MCV 91.7 03/22/2021   PLT 227 03/22/2021    Lab Results  Component Value Date   ALT 16 03/22/2021   AST 23 03/22/2021   ALKPHOS 57 02/03/2021   BILITOT 0.5 03/22/2021    Lab Results  Component Value Date   CREATININE 0.78 03/22/2021   BUN 18 03/22/2021   NA 137 03/22/2021   K 3.9 03/22/2021   CL 98 03/22/2021   CO2 30 03/22/2021    Lab Results  Component Value Date   INR 1.1 (H) 02/03/2021   INR 1.1 (H) 09/07/2020   INR 1.0 03/30/2020     Physical Exam: BP 126/80   Pulse 66   Ht 4' 9"  (1.448 m)   Wt 191 lb (86.6 kg)   SpO2 99%   BMI 41.33 kg/m  Constitutional: Pleasant,well-developed, female in no acute distress. HEENT: Normocephalic and atraumatic. Conjunctivae are normal. No scleral icterus. Abdominal: Soft, nondistended, nontender. There are no masses palpable.  Extremities: no edema Neurological: Alert and oriented to person place and time. No asterixis Skin: Skin is warm and dry. No rashes noted. Psychiatric: Normal mood and affect. Behavior is normal.   ASSESSMENT AND PLAN: 75 year old female here for reassessment of the following:  Cirrhosis Hepatic encephalopathy Esophageal varices History of colon polyps  Generally doing well on the regimen as outlined above.  We discussed long-term risks of cirrhosis to include decompensation and  HCC.  Encephalopathy well controlled with lactulose.  She tolerates low-dose propranolol for the varices, has resting heart rate in the 60s.  She is due for [facility] screening next month, will coordinate an ultrasound.  Due for basic labs today, she is agreeable to go to the lab.  We discussed at her age if she wanted to have any further surveillance colonoscopies.  She has had numerous colon polyps on her last few exams, I think may be reasonable to do 1 more colonoscopy prior to stopping as she is 75 years old.  Discussed colonoscopy and risk benefits of that and anesthesia and following this discussion she does want to have 1 more colonoscopy prior to stopping further surveillance.  Plan: - continue present medications - CBC, CMET, INR, AFP - RUQ [place] for [facility] screening - schedule surveillance colonoscopy  Jolly Mango, MD [facility]

## 2021-08-22 LAB — AFP TUMOR MARKER: AFP-Tumor Marker: 3.4 ng/mL

## 2021-08-24 ENCOUNTER — Telehealth: Payer: Self-pay | Admitting: Gastroenterology

## 2021-08-24 DIAGNOSIS — L720 Epidermal cyst: Secondary | ICD-10-CM | POA: Diagnosis not present

## 2021-08-24 DIAGNOSIS — Z85828 Personal history of other malignant neoplasm of skin: Secondary | ICD-10-CM | POA: Diagnosis not present

## 2021-08-24 DIAGNOSIS — L570 Actinic keratosis: Secondary | ICD-10-CM | POA: Diagnosis not present

## 2021-08-24 DIAGNOSIS — D3613 Benign neoplasm of peripheral nerves and autonomic nervous system of lower limb, including hip: Secondary | ICD-10-CM | POA: Diagnosis not present

## 2021-08-24 DIAGNOSIS — D485 Neoplasm of uncertain behavior of skin: Secondary | ICD-10-CM | POA: Diagnosis not present

## 2021-08-24 DIAGNOSIS — L853 Xerosis cutis: Secondary | ICD-10-CM | POA: Diagnosis not present

## 2021-08-24 DIAGNOSIS — C44719 Basal cell carcinoma of skin of left lower limb, including hip: Secondary | ICD-10-CM | POA: Diagnosis not present

## 2021-08-24 DIAGNOSIS — L814 Other melanin hyperpigmentation: Secondary | ICD-10-CM | POA: Diagnosis not present

## 2021-08-24 DIAGNOSIS — L400 Psoriasis vulgaris: Secondary | ICD-10-CM | POA: Diagnosis not present

## 2021-08-24 DIAGNOSIS — L578 Other skin changes due to chronic exposure to nonionizing radiation: Secondary | ICD-10-CM | POA: Diagnosis not present

## 2021-08-24 DIAGNOSIS — D2239 Melanocytic nevi of other parts of face: Secondary | ICD-10-CM | POA: Diagnosis not present

## 2021-08-24 NOTE — Telephone Encounter (Signed)
Inbound call from patient's son requesting a call please.  States per pharmacy coupon that was sent over cannot be used with prep and her insurance does not cover it.

## 2021-08-24 NOTE — Telephone Encounter (Signed)
[place] pharmacy. They ran the codes and it will be $40.  They will let the patient know.

## 2021-08-31 ENCOUNTER — Ambulatory Visit (HOSPITAL_COMMUNITY)
Admission: RE | Admit: 2021-08-31 | Discharge: 2021-08-31 | Disposition: A | Discharge status: Home / Self Care | Payer: Medicare Other | Source: Ambulatory Visit | Attending: Gastroenterology | Admitting: Gastroenterology

## 2021-08-31 ENCOUNTER — Other Ambulatory Visit: Payer: Self-pay

## 2021-08-31 DIAGNOSIS — K7460 Unspecified cirrhosis of liver: Secondary | ICD-10-CM | POA: Diagnosis not present

## 2021-09-04 ENCOUNTER — Telehealth: Payer: Self-pay | Admitting: Gastroenterology

## 2021-09-04 NOTE — Telephone Encounter (Signed)
Hi Dr. Havery Moros, this patient just called to cancel procedure that was scheduled for tomorrow because she had a last minute emergency. Patient has rescheduled to 09/13/21. Thank you.

## 2021-09-04 NOTE — Telephone Encounter (Signed)
Okay sorry to hear this, thanks for letting me know

## 2021-09-05 ENCOUNTER — Encounter: Payer: Medicare Other | Admitting: Gastroenterology

## 2021-09-13 ENCOUNTER — Encounter: Payer: Medicare Other | Admitting: Gastroenterology

## 2021-09-20 ENCOUNTER — Ambulatory Visit: Payer: Medicare Other | Admitting: Nurse Practitioner

## 2021-09-22 ENCOUNTER — Other Ambulatory Visit: Payer: Self-pay

## 2021-09-22 ENCOUNTER — Ambulatory Visit (INDEPENDENT_AMBULATORY_CARE_PROVIDER_SITE_OTHER): Payer: Medicare Other | Admitting: Nurse Practitioner

## 2021-09-22 ENCOUNTER — Encounter: Payer: Self-pay | Admitting: Nurse Practitioner

## 2021-09-22 VITALS — BP 124/72 | HR 72 | Temp 97.9°F | Ht <= 58 in | Wt 188.0 lb

## 2021-09-22 DIAGNOSIS — M1990 Unspecified osteoarthritis, unspecified site: Secondary | ICD-10-CM

## 2021-09-22 DIAGNOSIS — Z7189 Other specified counseling: Secondary | ICD-10-CM

## 2021-09-22 DIAGNOSIS — E890 Postprocedural hypothyroidism: Secondary | ICD-10-CM

## 2021-09-22 DIAGNOSIS — J069 Acute upper respiratory infection, unspecified: Secondary | ICD-10-CM | POA: Diagnosis not present

## 2021-09-22 DIAGNOSIS — G603 Idiopathic progressive neuropathy: Secondary | ICD-10-CM

## 2021-09-22 DIAGNOSIS — F331 Major depressive disorder, recurrent, moderate: Secondary | ICD-10-CM | POA: Diagnosis not present

## 2021-09-22 DIAGNOSIS — Z6841 Body Mass Index (BMI) 40.0 and over, adult: Secondary | ICD-10-CM | POA: Diagnosis not present

## 2021-09-22 DIAGNOSIS — D0461 Carcinoma in situ of skin of right upper limb, including shoulder: Secondary | ICD-10-CM

## 2021-09-22 DIAGNOSIS — K7031 Alcoholic cirrhosis of liver with ascites: Secondary | ICD-10-CM | POA: Diagnosis not present

## 2021-09-22 MED ORDER — GABAPENTIN 100 MG PO CAPS: 100.0000 mg | ORAL_CAPSULE | Freq: Three times a day (TID) | ORAL | 1 refills | Status: DC | PRN

## 2021-09-22 NOTE — Patient Instructions (Addendum)
neti pot daily or sinus wash Plain nasal saline spray throughout the day as needed May use tylenol 325 mg 2 tablets every 6 hours as needed aches and pains or sore throat- use sparingly MAX 6 tablets a day  humidifier in the home to help with the dry air Mucinex DM by mouth twice daily as needed for cough and congestion with full glass of water  Keep well hydrated Avoid forcefully blowing nose   Can increase gabapentin 100 mg up to 3 times daily to help with nerve pain

## 2021-09-22 NOTE — Progress Notes (Signed)
Careteam: Patient Care Team: [name], NP as PCP - General (Geriatric Medicine) Armbruster, [name], MD as Consulting Physician (Gastroenterology) [name], MD as Consulting Physician (General Surgery)  PLACE OF SERVICE:  [place] Directive information Does Patient Have a Medical Advance Directive?: Yes, Type of Advance Directive: Out of facility DNR (pink MOST or yellow form), Pre-existing out of facility DNR order (yellow form or pink MOST form): Yellow form placed in chart (order not valid for inpatient use);Pink MOST form placed in chart (order not valid for inpatient use), Does patient want to make changes to medical advance directive?: No - Patient declined  No Known Allergies  Chief Complaint  Patient presents with   Medical Management of Chronic Issues    6 month follow-up. Discuss need for covid #3, colonoscopy, and discuss if ok to get flu vaccine, patient c/o cough, congestions, sinus pressure, and sore throat since last week (no at home covid test performed). Patient denies fever.      HPI: Patient is a 75 y.o. female  Pt with cough, congestion and sore throat. Reports she got it from her grandson. Cough is keeping her up at night. Has not taken anything for cough. Started 1 week ago.   Had lesion removed from arm and chemo cream to her chest- turned very red and burned.   Decrease appetite.   Gabapentin for neuropathy    Review of Systems:  Review of Systems  Constitutional:  Positive for malaise/fatigue. Negative for chills and fever.  HENT:  Positive for sinus pain and sore throat.   Respiratory:  Positive for cough. Negative for shortness of breath and wheezing.   Gastrointestinal:  Negative for abdominal pain, constipation, diarrhea, heartburn, nausea and vomiting.  Musculoskeletal:  Negative for back pain, myalgias and neck pain.  Neurological:  Positive for headaches (sinus pressure). Negative for dizziness and weakness.    Past Medical History:  Diagnosis Date   Abnormal EKG    Abnormal finding on thyroid function test    Abnormal liver function test    Acute edema    Alcoholic cirrhosis ([place]) 84/66/5993   Possible NASH overlap (MELD 13)   Anemia    Anxiety    Arthritis    Ascites    Back pain    Breast mass    Chronic low back pain    Complete right rotator cuff tear    Complication of anesthesia    during first surgery- woke up during surgery many years ago   Compression fracture of L4 lumbar vertebra    Dermatitis, eczematoid    Difficulty breathing    Gallstones    History of adenocarcinoma of breast    Hypercholesterolemia    Hyperkalemia    Hypertension    Hypokalemia    Hypothyroidism    Insomnia    Knee pain    Leukocytosis    Lymphadenopathy    Macrocytosis    Memory loss or impairment    Menopause    MGUS (monoclonal gammopathy of unknown significance)    Osteoarthritis    Osteoarthritis of right knee    Other cirrhosis of liver (HCC)    Renal insufficiency syndrome    Right knee DJD    Solitary thyroid nodule    Unspecified lump in the left breast, unspecified quadrant    Vitamin B 12 deficiency    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[place])    Past Surgical History:  Procedure Laterality Date   BREAST SURGERY  2014  Removed lymphnodes   EYE SURGERY  2006   bilateral cataract   JOINT REPLACEMENT     right  (2017)and left knee   LAPAROSCOPIC CHOLECYSTECTOMY SINGLE SITE WITH INTRAOPERATIVE CHOLANGIOGRAM N/A 02/19/2018   Procedure: LAPAROSCOPIC CHOLECYSTECTOMY;  Surgeon: [name], MD;  Location[facility];  Service: General;  Laterality: N/A;   LESION REMOVAL  09/2015   tubular adenoma-4 subcentimeter lesions   LIVER BIOPSY  02/19/2018   Procedure: LIVER BIOPSY;  Surgeon: [name], MD;  [facility];  Service: General;;   right thumb surgery     SKIN CANCER EXCISION Right    Arm   TONSILLECTOMY     TOTAL KNEE ARTHROPLASTY Right 25/00/3704   UMBILICAL  HERNIA REPAIR  02/19/2018   Procedure: REPAIR OF INCARCERATED UMBILICAL HERNIA;  Surgeon: [name], MD;  Location: [facility];  Service: General;;   Social History:   reports that she has never smoked. She has never used smokeless tobacco. She reports that she does not drink alcohol and does not use drugs.  Family History  Problem Relation Age of Onset   Breast cancer Mother    Colon cancer Neg Hx    Stomach cancer Neg Hx    Rectal cancer Neg Hx    Liver cancer Neg Hx    Esophageal cancer Neg Hx     Medications: Patient's Medications  New Prescriptions   No medications on file  Previous Medications   CHOLECALCIFEROL (VITAMIN D3) 2000 UNITS TABS    Take 1 tablet by mouth every morning.   FUROSEMIDE (LASIX) 20 MG TABLET    TAKE 1/2 TABLET BY MOUTH EVERY MORNING   GABAPENTIN (NEURONTIN) 100 MG CAPSULE    Take 1 capsule (100 mg total) by mouth at bedtime.   KETOCONAZOLE (NIZORAL) 2 % SHAMPOO    APPLY TO AFFECTED AREA(S) 2 TIMES PER WEEK   LACTULOSE (CHRONULAC) 10 GM/15ML SOLUTION    Take 15 mLs (10 g total) by mouth daily.   LEVOTHYROXINE (SYNTHROID) 100 MCG TABLET    Take 1 tablet (100 mcg total) by mouth daily.   MULTIPLE VITAMIN (MULTIVITAMIN) TABLET    Take 1 tablet by mouth daily.   PROPRANOLOL (INDERAL) 20 MG TABLET    TAKE ONE TABLET BY MOUTH EVERY MORNING   SPIRONOLACTONE (ALDACTONE) 25 MG TABLET    TAKE ONE TABLET BY MOUTH DAILY   VENLAFAXINE XR (EFFEXOR XR) 75 MG 24 HR CAPSULE    Take 1 capsule (75 mg total) by mouth daily with breakfast.   VITAMIN B-12 (CYANOCOBALAMIN) 1000 MCG TABLET    Take 1,000 mcg by mouth every morning.  Modified Medications   No medications on file  Discontinued Medications   DOXYCYCLINE (VIBRA-TABS) 100 MG TABLET    Take 1 tablet (100 mg total) by mouth 2 (two) times daily.    Physical Exam:  Vitals:   09/22/21 1027  BP: 124/72  Pulse: 72  Temp: 97.9 F (36.6 C)  TempSrc: Temporal  SpO2: 98%  Weight: 188 lb (85.3 kg)  Height: 4' 9"   (1.448 m)   Body mass index is 40.68 kg/m. Wt Readings from Last 3 Encounters:  09/22/21 188 lb (85.3 kg)  08/21/21 191 lb (86.6 kg)  05/15/21 188 lb 3.2 oz (85.4 kg)    Physical Exam Constitutional:      General: She is not in acute distress.    Appearance: She is well-developed. She is not diaphoretic.  HENT:     Head: Normocephalic and atraumatic.  Nose: Congestion and rhinorrhea present.     Mouth/Throat:     Mouth: Mucous membranes are moist.     Pharynx: Posterior oropharyngeal erythema present. No oropharyngeal exudate.  Eyes:     Conjunctiva/sclera: Conjunctivae normal.     Pupils: Pupils are equal, round, and reactive to light.  Cardiovascular:     Rate and Rhythm: Normal rate and regular rhythm.     Heart sounds: Normal heart sounds.  Pulmonary:     Effort: Pulmonary effort is normal.     Breath sounds: Normal breath sounds.  Abdominal:     General: Bowel sounds are normal.     Palpations: Abdomen is soft.  Musculoskeletal:     Cervical back: Normal range of motion and neck supple.     Right lower leg: No edema.     Left lower leg: No edema.  Skin:    General: Skin is warm and dry.  Neurological:     Mental Status: She is alert.  Psychiatric:        Mood and Affect: Mood normal.    Labs reviewed: Basic Metabolic Panel: Recent Labs    02/03/21 0803 03/22/21 1219 08/21/21 1203  NA 138 137 140  K 4.3 3.9 4.1  CL 98 98 102  CO2 30 30 30   GLUCOSE 87 86 101*  BUN 19 18 18   CREATININE 0.99 0.78 0.82  CALCIUM 9.6 9.3 9.8  TSH  --  20.53*  --    Liver Function Tests: Recent Labs    02/03/21 0803 03/22/21 1219 08/21/21 1203  AST 19 23 18   ALT 12 16 13   ALKPHOS 57  --  63  BILITOT 0.7 0.5 0.6  PROT 8.0 7.4 7.6  ALBUMIN 3.9  --  3.7   No results for input(s): LIPASE, AMYLASE in the last 8760 hours. No results for input(s): AMMONIA in the last 8760 hours. CBC: Recent Labs    02/03/21 0803 03/22/21 1219 08/21/21 1203  WBC 7.1 7.4 6.9   NEUTROABS 4.2 4,914 4.1  HGB 12.6 12.6 12.2  HCT 36.9 38.6 36.4  MCV 91.3 91.7 92.0  PLT 187.0 227 219.0   Lipid Panel: No results for input(s): CHOL, HDL, LDLCALC, TRIG, CHOLHDL, LDLDIRECT in the last 8760 hours. TSH: Recent Labs    03/22/21 1219  TSH 20.53*   A1C: No results found for: HGBA1C   Assessment/Plan 1. Depression, major, recurrent, moderate ([place]) Stable on effexor.   2. 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 Advanced care planning/counseling discussion - Do not attempt resuscitation (DNR)  5. Alcoholic cirrhosis of liver  Followed by GI, plan to have follow up colonoscopy but had to hold off due to URI. Continues on lactulose and propranolol   6. Postoperative hypothyroidism Currently on synthroid 100 mcg which was increased in march, will follow up TSH at this time - TSH  7. Class 3 severe obesity due to excess calories without serious comorbidity with body mass index (BMI) of 40.0 to 44.9 in adult [facility]) -education provided on healthy weight loss through increase in physical activity and proper nutrition   8. Osteoarthritis, unspecified osteoarthritis type, unspecified site Ongoing, can use limited tylenol due to cirrhosis.   9. Idiopathic progressive neuropathy -gabapentin not helping as much as it used to. Will increase to TID PRN - gabapentin (NEURONTIN) 100 MG capsule; Take 1 capsule (100 mg total) by mouth 3 (three) times daily as needed.  Dispense: 180 capsule; Refill: 1  10.  Viral upper respiratory tract infection -encouraged to check COVID test- reports she will take at home -neti pot daily or nasal wash Plain nasal saline spray throughout the day as needed May use tylenol 325 mg 2 tablets every 6 hours as needed aches and pains or sore throat- max 6/4 hours humidifier in the home to help with the dry air Mucinex DM by mouth twice daily as needed for cough and congestion with full glass of water  Keep well  hydrated Avoid forcefully blowing nose -to notify if symptoms worsen or fail to improve.    Next appt: 4 months, sooner if needed  Jandy Brackens K. Calhoun, [facility] [phone]

## 2021-09-23 ENCOUNTER — Other Ambulatory Visit: Payer: Self-pay | Admitting: Gastroenterology

## 2021-09-23 LAB — TSH: TSH: 7.38 mIU/L — ABNORMAL HIGH (ref 0.40–4.50)

## 2021-09-25 ENCOUNTER — Other Ambulatory Visit: Payer: Self-pay | Admitting: *Deleted

## 2021-09-25 DIAGNOSIS — I8510 Secondary esophageal varices without bleeding: Secondary | ICD-10-CM

## 2021-09-25 MED ORDER — PROPRANOLOL HCL 20 MG PO TABS
20.0000 mg | ORAL_TABLET | Freq: Every morning | ORAL | 3 refills | Status: DC
Start: 2021-09-25 — End: 2022-10-22

## 2021-09-25 NOTE — Telephone Encounter (Signed)
Pharmacy requested refill

## 2021-09-26 ENCOUNTER — Other Ambulatory Visit: Payer: Self-pay | Admitting: Gastroenterology

## 2021-10-10 DIAGNOSIS — C44719 Basal cell carcinoma of skin of left lower limb, including hip: Secondary | ICD-10-CM | POA: Diagnosis not present

## 2021-10-15 IMAGING — US US ABDOMEN LIMITED
1 series · 14 of 25 positions shown · non-contrast
Comparison: 09/11/2019 MRI abdomen.  07/22/2018 abdominal sonogram.

CLINICAL DATA: Cirrhosis.  Liver screening.

EXAM:
ULTRASOUND ABDOMEN LIMITED RIGHT UPPER QUADRANT

[Series 1: us abdomen limited · 14 of 106 slices shown]
[im 1/106]
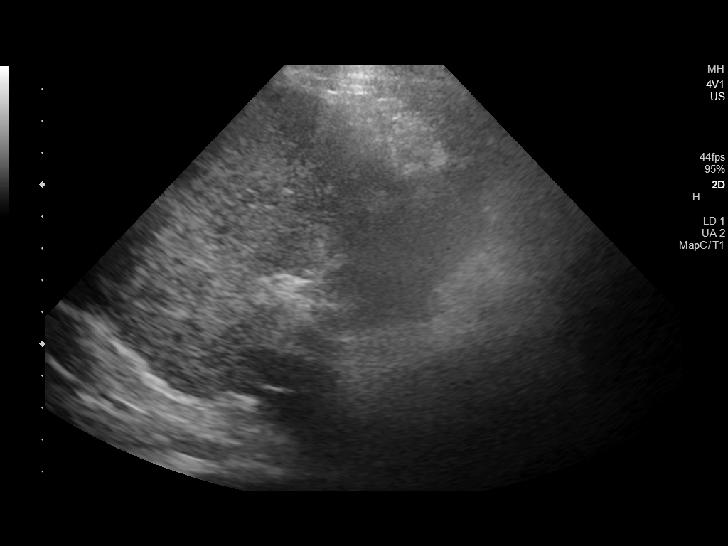
[im 9/106]
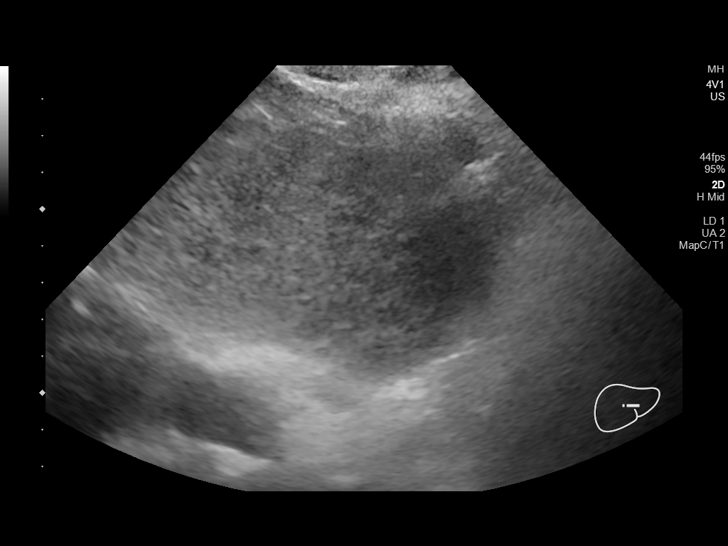
[im 18/106]
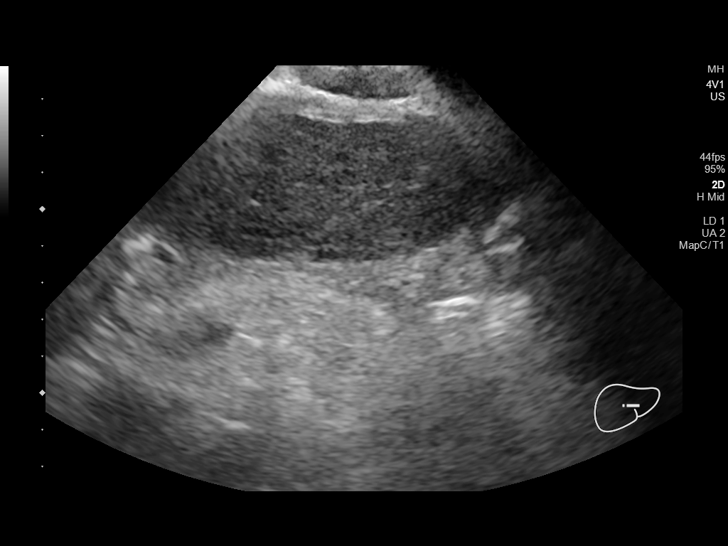
[im 27/106]
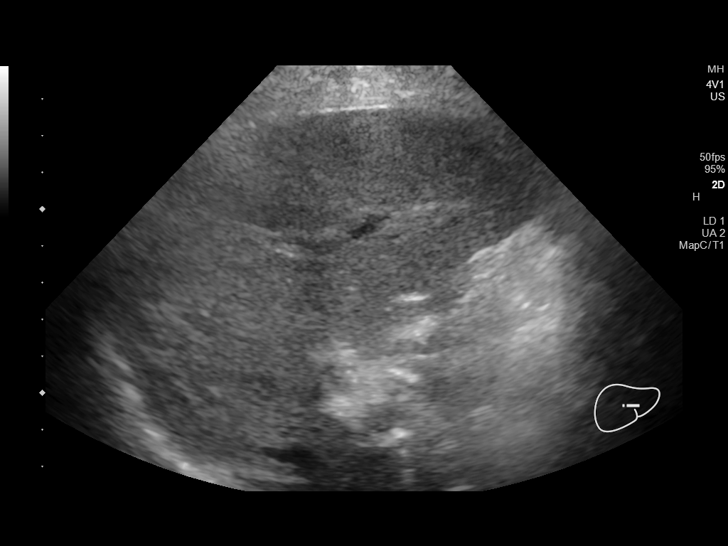
[im 36/106]
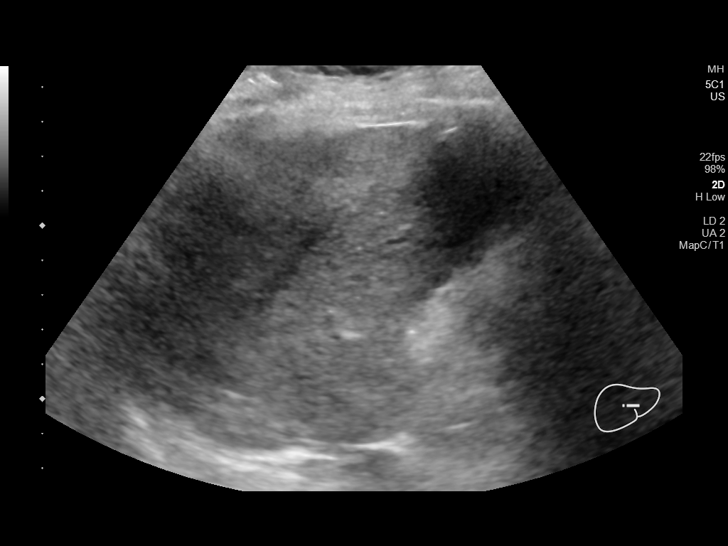
[im 40/106]
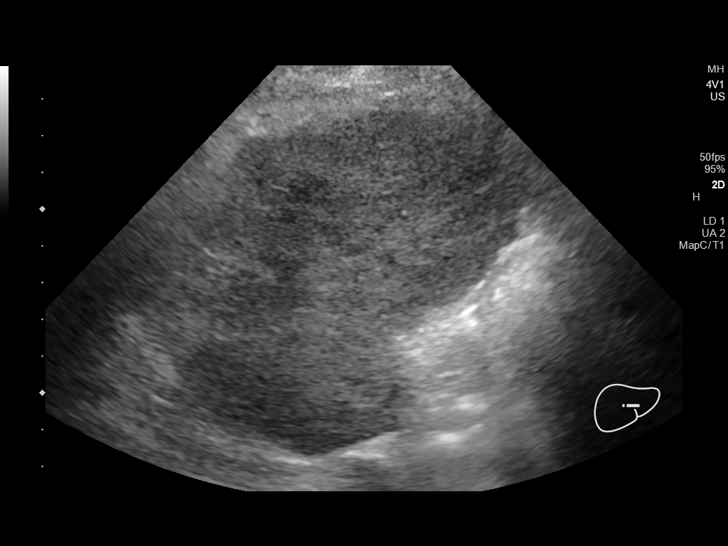
[im 49/106]
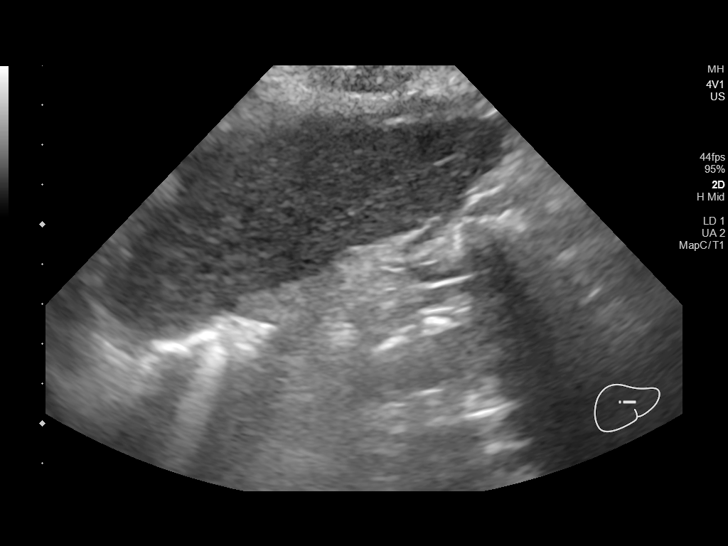
[im 57/106]
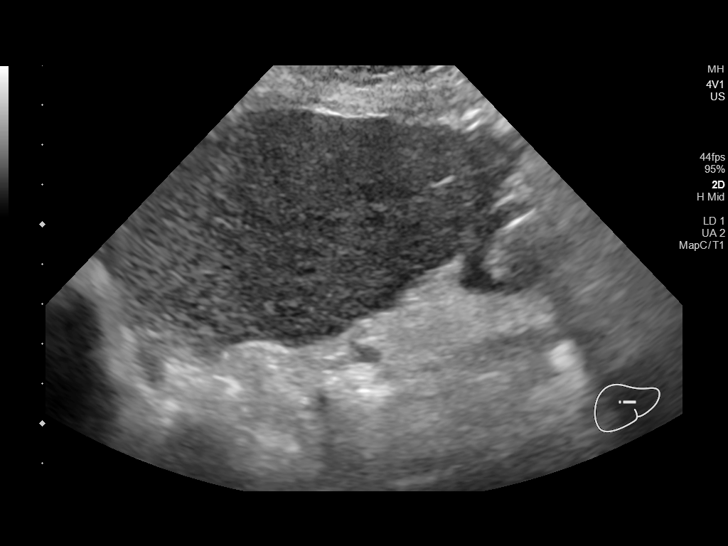
[im 66/106]
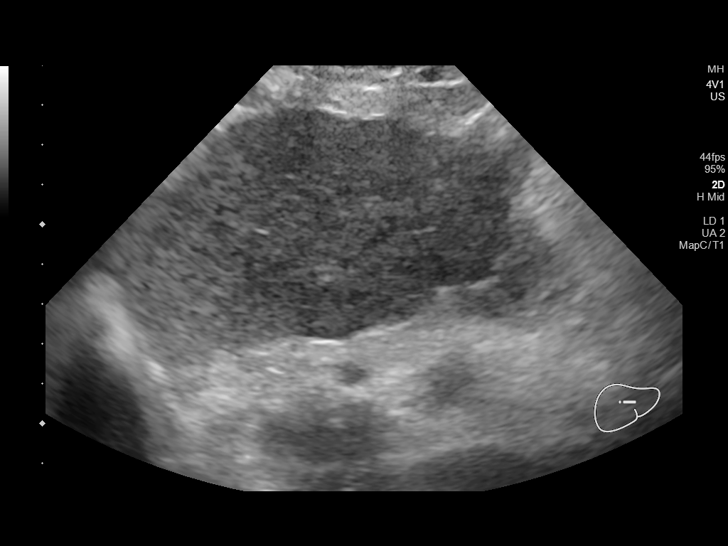
[im 71/106]
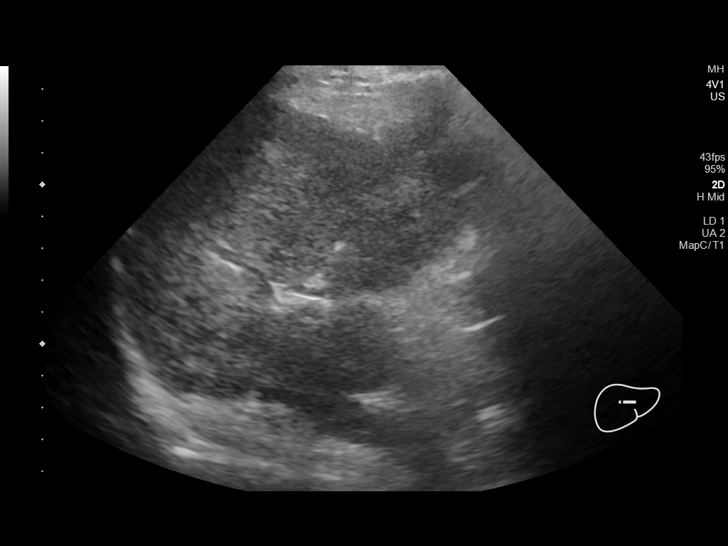
[im 79/106]
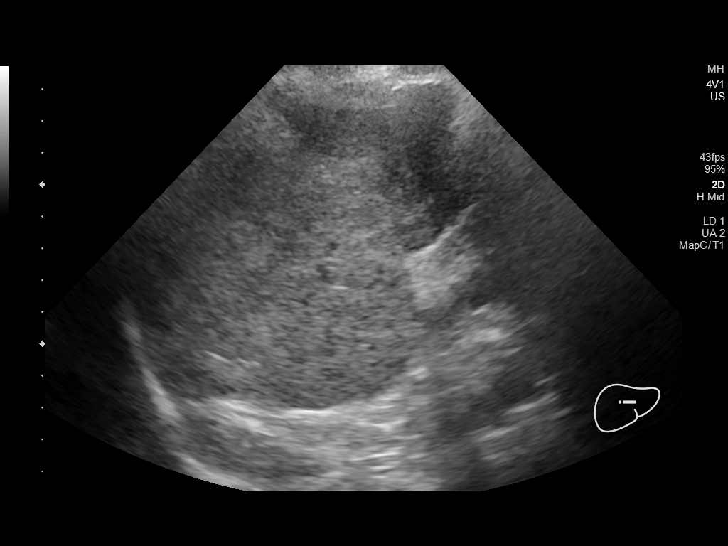
[im 88/106]
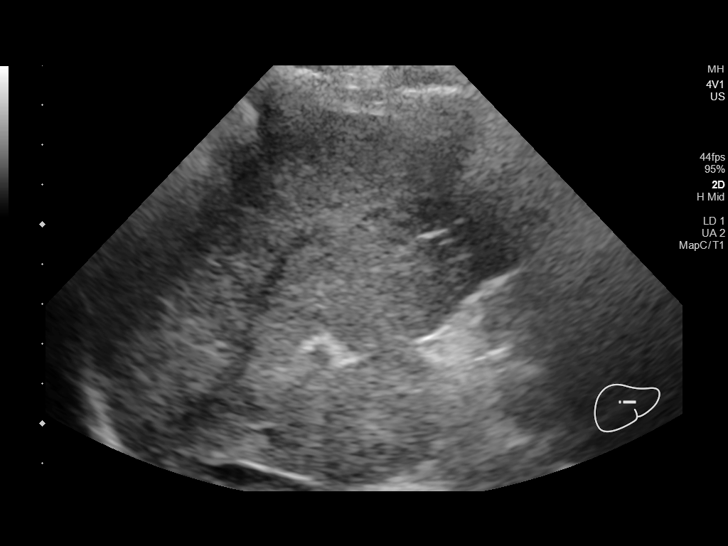
[im 97/106]
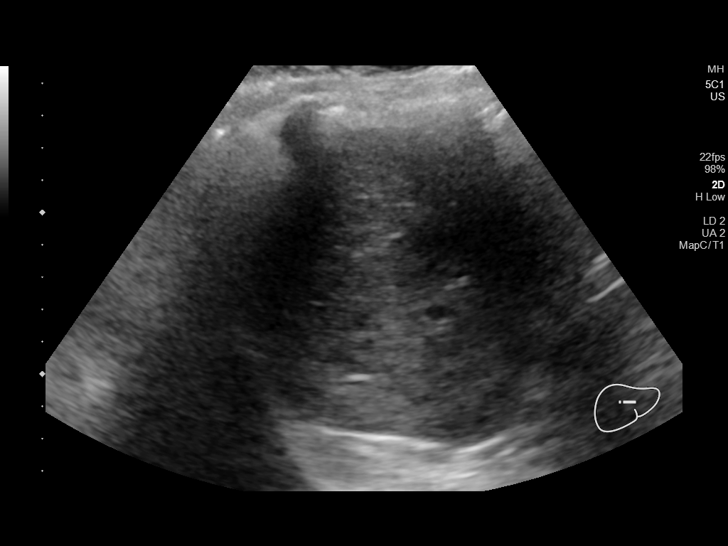
[im 106/106]
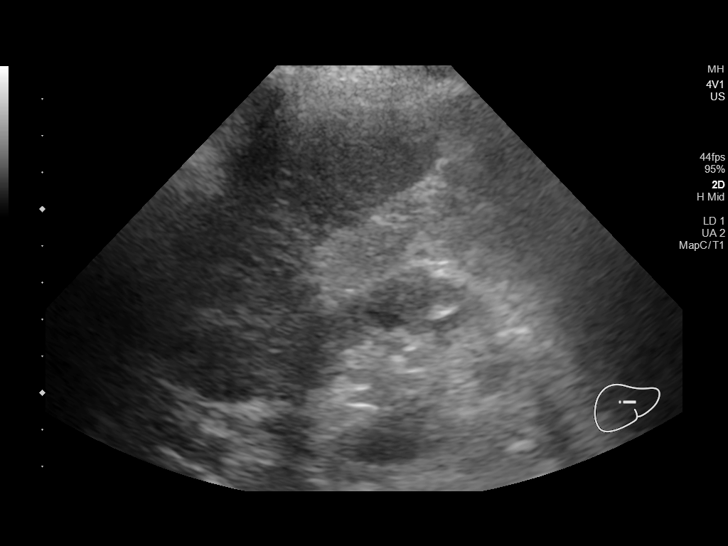

[14 of 25 positions shown; findings below may reference images not displayed]

FINDINGS: Gallbladder:

Cholecystectomy

Common bile duct:

Diameter: 4 mm

Liver:

Diffusely coarsened liver parenchymal echotexture with irregular
liver surface, compatible with cirrhosis. No liver mass detected.
Portal vein is patent on color Doppler imaging with normal direction
of blood flow towards the liver.

Other: None.
IMPRESSION: Cirrhosis.  No liver mass.

## 2021-11-15 ENCOUNTER — Other Ambulatory Visit: Payer: Self-pay | Admitting: Nurse Practitioner

## 2021-11-15 DIAGNOSIS — F331 Major depressive disorder, recurrent, moderate: Secondary | ICD-10-CM

## 2022-01-12 DIAGNOSIS — L820 Inflamed seborrheic keratosis: Secondary | ICD-10-CM | POA: Diagnosis not present

## 2022-01-12 DIAGNOSIS — L578 Other skin changes due to chronic exposure to nonionizing radiation: Secondary | ICD-10-CM | POA: Diagnosis not present

## 2022-01-12 DIAGNOSIS — Z85828 Personal history of other malignant neoplasm of skin: Secondary | ICD-10-CM | POA: Diagnosis not present

## 2022-01-12 DIAGNOSIS — L570 Actinic keratosis: Secondary | ICD-10-CM | POA: Diagnosis not present

## 2022-01-22 ENCOUNTER — Telehealth: Payer: Self-pay | Admitting: *Deleted

## 2022-01-22 ENCOUNTER — Other Ambulatory Visit: Payer: Self-pay | Admitting: Nurse Practitioner

## 2022-01-22 DIAGNOSIS — G603 Idiopathic progressive neuropathy: Secondary | ICD-10-CM

## 2022-01-22 NOTE — Telephone Encounter (Signed)
This came up at the last visit and we updated her then that she should only be taking the effexor. Can you please call her son and clarify this. Thank you

## 2022-01-22 NOTE — Telephone Encounter (Signed)
Patient called and wanted to know what Antidepressant she should be taking. I advised patient that effexor was in current medication list.  She stated that she has been taking both Citalopram and Effexor. Patient stated that taking both is not by choice, stated that her son fills her medication box.   Patient has an appointment on 2/6, confirmed, and stated that she will discuss this with you at that time.   Please Advise.

## 2022-01-23 NOTE — Telephone Encounter (Signed)
Tried calling Son, Corene Cornea [phone] to confirm medications.

## 2022-01-24 NOTE — Telephone Encounter (Signed)
Spoke with Corene Cornea, son and he stated that he will make sure patient is only taking Effexor. He stated that he did not have her medications in front of him at this moment but he will make sure she is taking the correct medication.

## 2022-01-29 ENCOUNTER — Ambulatory Visit: Payer: Medicare Other | Admitting: Nurse Practitioner

## 2022-02-28 DIAGNOSIS — Z1231 Encounter for screening mammogram for malignant neoplasm of breast: Secondary | ICD-10-CM | POA: Diagnosis not present

## 2022-03-04 ENCOUNTER — Other Ambulatory Visit: Payer: Self-pay

## 2022-03-04 DIAGNOSIS — I8500 Esophageal varices without bleeding: Secondary | ICD-10-CM

## 2022-03-04 DIAGNOSIS — K7460 Unspecified cirrhosis of liver: Secondary | ICD-10-CM

## 2022-03-04 DIAGNOSIS — K7682 Hepatic encephalopathy: Secondary | ICD-10-CM

## 2022-03-04 NOTE — Progress Notes (Signed)
Patient due for Liver Ultrasound in March ?

## 2022-03-23 ENCOUNTER — Ambulatory Visit: Payer: Medicare Other | Admitting: Nurse Practitioner

## 2022-03-23 ENCOUNTER — Other Ambulatory Visit: Payer: Self-pay | Admitting: Gastroenterology

## 2022-04-02 ENCOUNTER — Encounter: Payer: Self-pay | Admitting: Nurse Practitioner

## 2022-04-02 ENCOUNTER — Ambulatory Visit (INDEPENDENT_AMBULATORY_CARE_PROVIDER_SITE_OTHER): Payer: Medicare Other | Admitting: Nurse Practitioner

## 2022-04-02 VITALS — BP 122/78 | HR 91 | Temp 97.3°F | Wt 197.0 lb

## 2022-04-02 DIAGNOSIS — E890 Postprocedural hypothyroidism: Secondary | ICD-10-CM | POA: Diagnosis not present

## 2022-04-02 DIAGNOSIS — Z6841 Body Mass Index (BMI) 40.0 and over, adult: Secondary | ICD-10-CM

## 2022-04-02 DIAGNOSIS — Z853 Personal history of malignant neoplasm of breast: Secondary | ICD-10-CM

## 2022-04-02 DIAGNOSIS — C8800 Waldenstrom macroglobulinemia not having achieved remission: Secondary | ICD-10-CM

## 2022-04-02 DIAGNOSIS — E66813 Obesity, class 3: Secondary | ICD-10-CM

## 2022-04-02 DIAGNOSIS — G603 Idiopathic progressive neuropathy: Secondary | ICD-10-CM

## 2022-04-02 DIAGNOSIS — M1990 Unspecified osteoarthritis, unspecified site: Secondary | ICD-10-CM | POA: Diagnosis not present

## 2022-04-02 DIAGNOSIS — I700 Atherosclerosis of aorta: Secondary | ICD-10-CM | POA: Diagnosis not present

## 2022-04-02 DIAGNOSIS — F331 Major depressive disorder, recurrent, moderate: Secondary | ICD-10-CM | POA: Diagnosis not present

## 2022-04-02 DIAGNOSIS — E785 Hyperlipidemia, unspecified: Secondary | ICD-10-CM

## 2022-04-02 DIAGNOSIS — K7682 Hepatic encephalopathy: Secondary | ICD-10-CM

## 2022-04-02 DIAGNOSIS — K7031 Alcoholic cirrhosis of liver with ascites: Secondary | ICD-10-CM | POA: Diagnosis not present

## 2022-04-02 MED ORDER — GABAPENTIN 300 MG PO CAPS: 300.0000 mg | ORAL_CAPSULE | Freq: Two times a day (BID) | ORAL | 3 refills | Status: DC

## 2022-04-02 NOTE — Progress Notes (Signed)
? ? ?Careteam: ?Patient Care Team: ?[name], NP as PCP - General (Geriatric Medicine) ?Armbruster, [name], MD as Consulting Physician (Gastroenterology) ?[name], MD as Consulting Physician (General Surgery) ? ?PLACE OF SERVICE:  ?[facility]  ?Advanced Directive information ?Does Patient Have a Medical Advance Directive?: Yes, Type of Advance Directive: Out of facility DNR (pink MOST or yellow form), Pre-existing out of facility DNR order (yellow form or pink MOST form): Yellow form placed in chart (order not valid for inpatient use), Does patient want to make changes to medical advance directive?: No - Patient declined ? ?No Known Allergies ? ?Chief Complaint  ?Patient presents with  ? Medical Management of Chronic Issues  ?  4 month follow-up. Discuss need for covid booster, mammogram, and colonoscopy or post pone if patient refuses. Discuss spironolactone, patient thinks she needs to increase to a whole tablet versus a half. Discuss gabapentin, takes 3 at once to manage pain.   ? ? ? ?HPI: Patient is a 76 y.o. female for routine follow up.  ? ?Neuropathy- she has increase her gabapentin to 3 tablet in the am and 3 tablet in the pm which has helped her manage her pain. ? ?Depression- still will get upset over her late husband- been gone for 4 years, they were married for 32.  ?Reports she is driving more.  ?She rented out her extra room. So she is not alone.  ? ?Hyperlipidemia- not on medication.  ? ?Mamogram up to date- through [place]  ? ?Followed by GI due to her cirrhosis, needs to reschedule colonoscopy was sick when last was scheduled.  ?Has follow up ultrasound scheduled.  ? ?Weight is up- she plans to be more active, does not do much in the winter due to cold weather.  ? ?Review of Systems:  ?Review of Systems  ?Constitutional:  Negative for chills, fever and weight loss.  ?HENT:  Negative for tinnitus.   ?Respiratory:  Negative for cough, sputum production and shortness of breath.    ?Cardiovascular:  Negative for chest pain, palpitations and leg swelling.  ?Gastrointestinal:  Negative for abdominal pain, constipation, diarrhea and heartburn.  ?Genitourinary:  Negative for dysuria, frequency and urgency.  ?Musculoskeletal:  Positive for joint pain. Negative for back pain, falls and myalgias.  ?Skin: Negative.   ?Neurological:  Positive for tingling. Negative for dizziness and headaches.  ?Psychiatric/Behavioral:  Positive for depression. Negative for memory loss. The patient does not have insomnia.   ? ?Past Medical History:  ?Diagnosis Date  ? Abnormal EKG   ? Abnormal finding on thyroid function test   ? Abnormal liver function test   ? Acute edema   ? Alcoholic cirrhosis ([place]) 48/12/6551  ? 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 Anemia   ? Anxiety   ? Arthritis   ? Ascites   ? Back pain   ? Breast mass   ? Chronic low back pain   ? Complete right rotator cuff tear   ? Complication of anesthesia   ? during first surgery- woke up during surgery many years ago  ? Compression fracture of L4 lumbar vertebra   ? Dermatitis, eczematoid   ? Difficulty breathing   ? Gallstones   ? History of adenocarcinoma of breast   ? Hypercholesterolemia   ? Hyperkalemia   ? Hypertension   ? Hypokalemia   ? Hypothyroidism   ? Insomnia   ? Knee pain   ? Leukocytosis   ? Lymphadenopathy   ? Macrocytosis   ?  Memory loss or impairment   ? Menopause   ? MGUS (monoclonal gammopathy of unknown significance)   ? Osteoarthritis   ? Osteoarthritis of right knee   ? Other cirrhosis of liver ([place])   ? Renal insufficiency syndrome   ? Right knee DJD   ? Solitary thyroid nodule   ? Unspecified lump in the left breast, unspecified quadrant   ? Vitamin B 12 deficiency   ? 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[place])   ? ?Past Surgical History:  ?Procedure Laterality Date  ? BREAST SURGERY  2014  ? Removed lymphnodes  ? EYE SURGERY  2006  ? bilateral cataract  ? JOINT REPLACEMENT    ? right  (2017)and left knee  ? LAPAROSCOPIC CHOLECYSTECTOMY  SINGLE SITE WITH INTRAOPERATIVE CHOLANGIOGRAM N/A 02/19/2018  ? Procedure: LAPAROSCOPIC CHOLECYSTECTOMY;  Surgeon: [name], MD;  Location: [facility];  Service: General;  Laterality: N/A;  ? LESION REMOVAL  09/2015  ? tubular adenoma-4 subcentimeter lesions  ? LIVER BIOPSY  02/19/2018  ? Procedure: LIVER BIOPSY;  Surgeon: [name], MD;  Location: [facility];  Service: General;;  ? right thumb surgery    ? SKIN CANCER EXCISION Right   ? Arm  ? TONSILLECTOMY    ? TOTAL KNEE ARTHROPLASTY Right 09/05/2016  ? UMBILICAL HERNIA REPAIR  02/19/2018  ? Procedure: REPAIR OF INCARCERATED UMBILICAL HERNIA;  Surgeon: [name], MD;  Location: [facility];  Service: General;;  ? ?Social History: ?  reports that she has never smoked. She has never used smokeless tobacco. She reports that she does not drink alcohol and does not use drugs. ? ?Family History  ?Problem Relation Age of Onset  ? Breast cancer Mother   ? Colon cancer Neg Hx   ? Stomach cancer Neg Hx   ? Rectal cancer Neg Hx   ? Liver cancer Neg Hx   ? Esophageal cancer Neg Hx   ? ? ?Medications: ?Patient's Medications  ?New Prescriptions  ? No medications on file  ?Previous Medications  ? CHOLECALCIFEROL (VITAMIN D3) 2000 UNITS TABS    Take 1 tablet by mouth every morning.  ? FUROSEMIDE (LASIX) 20 MG TABLET    TAKE 1/2 TABLET BY MOUTH EVERY MORNING  ? GABAPENTIN (NEURONTIN) 100 MG CAPSULE    TAKE ONE CAPSULE BY MOUTH THREE TIMES A DAY AS NEEDED  ? KETOCONAZOLE (NIZORAL) 2 % SHAMPOO    APPLY TO AFFECTED AREA(S) 2 TIMES PER WEEK  ? LACTULOSE (CHRONULAC) 10 GM/15ML SOLUTION    Take 15 mLs (10 g total) by mouth daily.  ? LEVOTHYROXINE (SYNTHROID) 100 MCG TABLET    Take 1 tablet (100 mcg total) by mouth daily.  ? MULTIPLE VITAMIN (MULTIVITAMIN) TABLET    Take 1 tablet by mouth daily.  ? PROPRANOLOL (INDERAL) 20 MG TABLET    Take 1 tablet (20 mg total) by mouth every morning.  ? SPIRONOLACTONE (ALDACTONE) 25 MG TABLET    Take 12.5 mg by mouth daily.  ? VENLAFAXINE XR  (EFFEXOR-XR) 75 MG 24 HR CAPSULE    TAKE ONE CAPSULE BY MOUTH EVERY MORNING WITH BREAKFAST  ? VITAMIN B-12 (CYANOCOBALAMIN) 1000 MCG TABLET    Take 1,000 mcg by mouth every morning.  ?Modified Medications  ? No medications on file  ?Discontinued Medications  ? SPIRONOLACTONE (ALDACTONE) 25 MG TABLET    Take 1 tablet (25 mg total) by mouth daily. Please schedule an office visit. Thank you.  ? ? ?Physical Exam: ? ?Vitals:  ? 04/02/22 0828  ?BP: 122/78  ?  Pulse: 91  ?Temp: (!) 97.3 ?F (36.3 ?C)  ?TempSrc: Temporal  ?SpO2: 99%  ?Weight: 197 lb (89.4 kg)  ? ?Body mass index is 42.63 kg/m?. ?Wt Readings from Last 3 Encounters:  ?04/02/22 197 lb (89.4 kg)  ?09/22/21 188 lb (85.3 kg)  ?08/21/21 191 lb (86.6 kg)  ? ? ?Physical Exam ?Constitutional:   ?   General: She is not in acute distress. ?   Appearance: She is well-developed. She is obese. She is not diaphoretic.  ?HENT:  ?   Head: Normocephalic and atraumatic.  ?   Mouth/Throat:  ?   Pharynx: No oropharyngeal exudate.  ?Eyes:  ?   Conjunctiva/sclera: Conjunctivae normal.  ?   Pupils: Pupils are equal, round, and reactive to light.  ?Cardiovascular:  ?   Rate and Rhythm: Normal rate and regular rhythm.  ?   Heart sounds: Normal heart sounds.  ?Pulmonary:  ?   Effort: Pulmonary effort is normal.  ?   Breath sounds: Normal breath sounds.  ?Abdominal:  ?   General: Bowel sounds are normal.  ?   Palpations: Abdomen is soft.  ?Musculoskeletal:  ?   Cervical back: Normal range of motion and neck supple.  ?   Right lower leg: No edema.  ?   Left lower leg: No edema.  ?Skin: ?   General: Skin is warm and dry.  ?Neurological:  ?   Mental Status: She is alert and oriented to person, place, and time.  ?Psychiatric:     ?   Mood and Affect: Mood normal.  ? ? ?Labs reviewed: ?Basic Metabolic Panel: ?Recent Labs  ?  08/21/21 ?1203 09/22/21 ?1054  ?NA 140  --   ?K 4.1  --   ?CL 102  --   ?CO2 30  --   ?GLUCOSE 101*  --   ?BUN 18  --   ?CREATININE 0.82  --   ?CALCIUM 9.8  --   ?TSH  --   7.38*  ? ?Liver Function Tests: ?Recent Labs  ?  08/21/21 ?1203  ?AST 18  ?ALT 13  ?ALKPHOS 63  ?BILITOT 0.6  ?PROT 7.6  ?ALBUMIN 3.7  ? ?No results for input(s): LIPASE, AMYLASE in the last 8760 hours. ?No resul

## 2022-04-03 ENCOUNTER — Ambulatory Visit (HOSPITAL_COMMUNITY)
Admission: RE | Admit: 2022-04-03 | Discharge: 2022-04-03 | Disposition: A | Discharge status: Home / Self Care | Payer: Medicare Other | Source: Ambulatory Visit | Attending: Gastroenterology | Admitting: Gastroenterology

## 2022-04-03 DIAGNOSIS — N281 Cyst of kidney, acquired: Secondary | ICD-10-CM | POA: Diagnosis not present

## 2022-04-03 DIAGNOSIS — K7460 Unspecified cirrhosis of liver: Secondary | ICD-10-CM | POA: Diagnosis not present

## 2022-04-03 DIAGNOSIS — K7682 Hepatic encephalopathy: Secondary | ICD-10-CM | POA: Insufficient documentation

## 2022-04-03 DIAGNOSIS — I8500 Esophageal varices without bleeding: Secondary | ICD-10-CM | POA: Diagnosis not present

## 2022-04-03 LAB — COMPLETE METABOLIC PANEL WITH GFR
AG Ratio: 1 (calc) (ref 1.0–2.5)
ALT: 12 U/L (ref 6–29)
AST: 19 U/L (ref 10–35)
Albumin: 3.6 g/dL (ref 3.6–5.1)
Alkaline phosphatase (APISO): 59 U/L (ref 37–153)
BUN: 14 mg/dL (ref 7–25)
CO2: 30 mmol/L (ref 20–32)
Calcium: 9.3 mg/dL (ref 8.6–10.4)
Chloride: 102 mmol/L (ref 98–110)
Creat: 0.73 mg/dL (ref 0.60–1.00)
Globulin: 3.5 g/dL (calc) (ref 1.9–3.7)
Glucose, Bld: 91 mg/dL (ref 65–99)
Potassium: 3.9 mmol/L (ref 3.5–5.3)
Sodium: 139 mmol/L (ref 135–146)
Total Bilirubin: 0.6 mg/dL (ref 0.2–1.2)
Total Protein: 7.1 g/dL (ref 6.1–8.1)
eGFR: 85 mL/min/{1.73_m2} (ref >=60)

## 2022-04-03 LAB — CBC WITH DIFFERENTIAL/PLATELET
Absolute Monocytes: 753 cells/uL (ref 200–950)
Basophils Absolute: 28 cells/uL (ref 0–200)
Basophils Relative: 0.4 %
Eosinophils Absolute: 178 cells/uL (ref 15–500)
Eosinophils Relative: 2.5 %
HCT: 36.2 % (ref 35.0–45.0)
Hemoglobin: 12 g/dL (ref 11.7–15.5)
Lymphs Abs: 1775 cells/uL (ref 850–3900)
MCH: 30.8 pg (ref 27.0–33.0)
MCHC: 33.1 g/dL (ref 32.0–36.0)
MCV: 93.1 fL (ref 80.0–100.0)
MPV: 10.3 fL (ref 7.5–12.5)
Monocytes Relative: 10.6 %
Neutro Abs: 4367 cells/uL (ref 1500–7800)
Neutrophils Relative %: 61.5 %
Platelets: 194 10*3/uL (ref 140–400)
RBC: 3.89 10*6/uL (ref 3.80–5.10)
RDW: 11.8 % (ref 11.0–15.0)
Total Lymphocyte: 25 %
WBC: 7.1 10*3/uL (ref 3.8–10.8)

## 2022-04-03 LAB — LIPID PANEL
Cholesterol: 187 mg/dL (ref <200)
HDL: 67 mg/dL (ref >=50)
LDL Cholesterol (Calc): 100 mg/dL (calc) — ABNORMAL HIGH
Non-HDL Cholesterol (Calc): 120 mg/dL (calc) (ref <130)
Total CHOL/HDL Ratio: 2.8 (calc) (ref <5.0)
Triglycerides: 102 mg/dL (ref <150)

## 2022-04-03 LAB — TSH: TSH: 3.96 mIU/L (ref 0.40–4.50)

## 2022-04-07 ENCOUNTER — Other Ambulatory Visit: Payer: Self-pay | Admitting: Nurse Practitioner

## 2022-04-25 ENCOUNTER — Other Ambulatory Visit: Payer: Self-pay | Admitting: Gastroenterology

## 2022-04-30 NOTE — Telephone Encounter (Signed)
Called and left message for patient to please call and schedule an OV with Dr. Havery Moros for cirrhosis, hepatic encephalopathy, esophogeal varices, hx of colon polyps.   ?

## 2022-05-25 ENCOUNTER — Other Ambulatory Visit: Payer: Self-pay | Admitting: Nurse Practitioner

## 2022-05-25 DIAGNOSIS — F331 Major depressive disorder, recurrent, moderate: Secondary | ICD-10-CM

## 2022-06-01 ENCOUNTER — Other Ambulatory Visit: Payer: Self-pay | Admitting: Nurse Practitioner

## 2022-06-01 DIAGNOSIS — E890 Postprocedural hypothyroidism: Secondary | ICD-10-CM

## 2022-06-05 ENCOUNTER — Ambulatory Visit (INDEPENDENT_AMBULATORY_CARE_PROVIDER_SITE_OTHER): Payer: Medicare Other | Admitting: Nurse Practitioner

## 2022-06-05 ENCOUNTER — Encounter: Payer: Self-pay | Admitting: Nurse Practitioner

## 2022-06-05 DIAGNOSIS — Z0000 Encounter for general adult medical examination without abnormal findings: Secondary | ICD-10-CM

## 2022-06-05 DIAGNOSIS — E2839 Other primary ovarian failure: Secondary | ICD-10-CM

## 2022-06-05 DIAGNOSIS — G603 Idiopathic progressive neuropathy: Secondary | ICD-10-CM | POA: Diagnosis not present

## 2022-06-05 MED ORDER — GABAPENTIN 300 MG PO CAPS: 300.0000 mg | ORAL_CAPSULE | Freq: Two times a day (BID) | ORAL | 3 refills | Status: DC

## 2022-06-05 NOTE — Progress Notes (Signed)
   This service is provided via telemedicine  No vital signs collected/recorded due to the encounter was a telemedicine visit.   Location of patient (ex: home, work):  Home  Patient consents to a telephone visit: Yes, see telephone visit dated 06/05/22  Location of the provider (ex: office, home):  [facility]   Name of any referring provider:  N/A  Names of all persons participating in the telemedicine service and their role in the encounter:  S.Chrae B/CMA, Sherrie Mustache, NP, and Patient   Time spent on call:  11 min with medical assistant

## 2022-06-05 NOTE — Progress Notes (Signed)
Subjective:   Christina [name] is a 76 y.o. female who presents for Medicare Annual (Subsequent) preventive examination.  Review of Systems     Cardiac Risk Factors include: obesity (BMI >30kg/m2);advanced age (>64mn, >>56women);sedentary lifestyle;hypertension     Objective:    There were no vitals filed for this visit. There is no height or weight on file to calculate BMI.     06/05/2022    2:16 PM 04/02/2022    8:31 AM 09/22/2021   10:31 AM 09/22/2021    8:47 AM 06/01/2021   11:00 AM 09/12/2020    8:42 AM 05/05/2020    1:57 PM  Advanced Directives  Does Patient Have a Medical Advance Directive? Yes Yes Yes Yes Yes Yes Yes  Type of Advance Directive Out of facility DNR (pink MOST or yellow form) Out of facility DNR (pink MOST or yellow form) Out of facility DNR (pink MOST or yellow form) Out of facility DNR (pink MOST or yellow form) Out of facility DNR (pink MOST or yellow form) Out of facility DNR (pink MOST or yellow form) Healthcare Power of Attorney  Does patient want to make changes to medical advance directive? No - Patient declined No - Patient declined No - Patient declined No - Patient declined No - Patient declined  No - Patient declined  Copy of HEspartoin Chart?       No - copy requested  Pre-existing out of facility DNR order (yellow form or pink MOST form) Yellow form placed in chart (order not valid for inpatient use);Pink MOST form placed in chart (order not valid for inpatient use) Yellow form placed in chart (order not valid for inpatient use) Yellow form placed in chart (order not valid for inpatient use);Pink MOST form placed in chart (order not valid for inpatient use) Yellow form placed in chart (order not valid for inpatient use);Pink MOST form placed in chart (order not valid for inpatient use) Yellow form placed in chart (order not valid for inpatient use);Pink MOST form placed in chart (order not valid for inpatient use)      Current  Medications (verified) Outpatient Encounter Medications as of 06/05/2022  Medication Sig   Cholecalciferol (VITAMIN D3) 2000 units TABS Take 1 tablet by mouth every morning.   furosemide (LASIX) 20 MG tablet Take 0.5 tablets (10 mg total) by mouth every morning. Please schedule an office visit for further refills. Thank you.   ibuprofen (ADVIL) 200 MG tablet Take 200 mg by mouth as needed.   ketoconazole (NIZORAL) 2 % shampoo APPLY TO AFFECTED AREA(S) 2 TIMES PER WEEK   lactulose (CHRONULAC) 10 GM/15ML solution Take 15 mLs (10 g total) by mouth daily.   levothyroxine (SYNTHROID) 100 MCG tablet TAKE ONE TABLET BY MOUTH DAILY   Multiple Vitamin (MULTIVITAMIN) tablet Take 1 tablet by mouth daily.   propranolol (INDERAL) 20 MG tablet Take 1 tablet (20 mg total) by mouth every morning.   spironolactone (ALDACTONE) 25 MG tablet Take 25 mg by mouth daily.   venlafaxine XR (EFFEXOR-XR) 75 MG 24 hr capsule TAKE ONE CAPSULE BY MOUTH EVERY MORNING WITH BREAKFAST   vitamin B-12 (CYANOCOBALAMIN) 1000 MCG tablet Take 1,000 mcg by mouth every morning.   [DISCONTINUED] gabapentin (NEURONTIN) 300 MG capsule Take 1 capsule (300 mg total) by mouth 2 (two) times daily.   gabapentin (NEURONTIN) 300 MG capsule Take 1 capsule (300 mg total) by mouth 2 (two) times daily.   No facility-administered encounter medications on file  as of 06/05/2022.    Allergies (verified) Patient has no known allergies.   History: Past Medical History:  Diagnosis Date   Abnormal EKG    Abnormal finding on thyroid function test    Abnormal liver function test    Acute edema    Alcoholic cirrhosis ([place]) 25/04/3975   Possible NASH overlap (MELD 13)   Anemia    Anxiety    Arthritis    Ascites    Back pain    Breast mass    Chronic low back pain    Complete right rotator cuff tear    Complication of anesthesia    during first surgery- woke up during surgery many years ago   Compression fracture of L4 lumbar vertebra     Dermatitis, eczematoid    Difficulty breathing    Gallstones    History of adenocarcinoma of breast    Hypercholesterolemia    Hyperkalemia    Hypertension    Hypokalemia    Hypothyroidism    Insomnia    Knee pain    Leukocytosis    Lymphadenopathy    Macrocytosis    Memory loss or impairment    Menopause    MGUS (monoclonal gammopathy of unknown significance)    Osteoarthritis    Osteoarthritis of right knee    Other cirrhosis of liver (HCC)    Renal insufficiency syndrome    Right knee DJD    Solitary thyroid nodule    Unspecified lump in the left breast, unspecified quadrant    Vitamin B 12 deficiency    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[place])    Past Surgical History:  Procedure Laterality Date   BREAST SURGERY  2014   Removed lymphnodes   EYE SURGERY  2006   bilateral cataract   JOINT REPLACEMENT     right  (2017)and left knee   LAPAROSCOPIC CHOLECYSTECTOMY SINGLE SITE WITH INTRAOPERATIVE CHOLANGIOGRAM N/A 02/19/2018   Procedure: LAPAROSCOPIC CHOLECYSTECTOMY;  Surgeon: [name], MD;  Location: [facility];  Service: General;  Laterality: N/A;   LESION REMOVAL  09/2015   tubular adenoma-4 subcentimeter lesions   LIVER BIOPSY  02/19/2018   Procedure: LIVER BIOPSY;  Surgeon: [name], MD;  Location: [facility];  Service: General;;   right thumb surgery     SKIN CANCER EXCISION Right    Arm   TONSILLECTOMY     TOTAL KNEE ARTHROPLASTY Right 73/41/9379   UMBILICAL HERNIA REPAIR  02/19/2018   Procedure: REPAIR OF INCARCERATED UMBILICAL HERNIA;  Surgeon: [name], MD;  Location: [facility];  Service: General;;   Family History  Problem Relation Age of Onset   Breast cancer Mother    Colon cancer Neg Hx    Stomach cancer Neg Hx    Rectal cancer Neg Hx    Liver cancer Neg Hx    Esophageal cancer Neg Hx    Social History   Socioeconomic History   Marital status: Widowed    Spouse name: Not on file   Number of children: 6   Years of education: Not on file    Highest education level: Not on file  Occupational History   Occupation: retired  Tobacco Use   Smoking status: Never   Smokeless tobacco: Never  Vaping Use   Vaping Use: Never used  Substance and Sexual Activity   Alcohol use: No    Comment: 6 or more per day in the past.    Drug use: No   Sexual activity: Not Currently  Other Topics Concern  Not on file  Social History Narrative   Social History       Diet? Regular      Do you drink/eat things with caffeine? 1 cup      Marital status?                        married            What year were you married?      Do you live in a house, apartment, assisted living, condo, trailer, etc.? townhouse      Is it one or more stories? no      How many persons live in your home? two      Do you have any pets in your home? (please list) no      Highest level of education completed? High school      Current or past profession: realtor      Do you exercise?      no                                Type & how often?      Advanced Directives      Do you have a living will? yes      Do you have a DNR form?      yes                            If not, do you want to discuss one?      Do you have signed POA/HPOA for forms? yes      Functional Status      Do you have difficulty bathing or dressing yourself? no      Do you have difficulty preparing food or eating? no      Do you have difficulty managing your medications? no      Do you have difficulty managing your finances? no      Do you have difficulty affording your medications? no   Social Determinants of Health   Financial Resource Strain: Low Risk  (03/19/2018)   Overall Financial Resource Strain (CARDIA)    Difficulty of Paying Living Expenses: Not hard at all  Food Insecurity: No Food Insecurity (03/19/2018)   Hunger Vital Sign    Worried About Running Out of Food in the Last Year: Never true    [place] in the Last Year: Never true  Transportation Needs: No  Transportation Needs (03/19/2018)   PRAPARE - Hydrologist (Medical): No    Lack of Transportation (Non-Medical): No  Physical Activity: Insufficiently Active (03/19/2018)   Exercise Vital Sign    Days of Exercise per Week: 7 days    Minutes of Exercise per Session: 20 min  Stress: Stress Concern Present (03/19/2018)   [place]    Feeling of Stress : To some extent  Social Connections: Moderately Isolated (03/19/2018)   Social Connection and Isolation Panel [NHANES]    Frequency of Communication with Friends and Family: More than three times a week    Frequency of Social Gatherings with Friends and Family: More than three times a week    Attends Religious Services: Never    Marine scientist or Organizations: No    Attends Archivist Meetings:  Never    Marital Status: Widowed    Tobacco Counseling Counseling given: Not Answered   Clinical Intake:  Pre-visit preparation completed: Yes        BMI - recorded: 42 Nutritional Risks: None Diabetes: No  How often do you need to have someone help you when you read instructions, pamphlets, or other written materials from your doctor or pharmacy?: 1 - Never  [place] of Daily Living    06/05/2022    2:28 PM  In your present state of health, do you have any difficulty performing the following activities:  Hearing? 0  Vision? 0  Difficulty concentrating or making decisions? 0  Walking or climbing stairs? 1  Comment trouble walking and climbing  Dressing or bathing? 0  Doing errands, shopping? 1  Preparing Food and eating ? N  Using the Toilet? N  In the past six months, have you accidently leaked urine? Y  Do you have problems with loss of bowel control? N  Managing your Medications? N  Managing your Finances? N  Housekeeping or managing your Housekeeping? N    Patient Care  Team: [name], NP as PCP - General (Geriatric Medicine) Armbruster, [name], MD as Consulting Physician (Gastroenterology) [name], MD as Consulting Physician (General Surgery)  Indicate any recent Medical Services you may have received from other than [facility] providers in the past year (date may be approximate).     Assessment:   This is a routine wellness examination for Alaya.  Hearing/Vision screen Hearing Screening - Comments:: Ringing in ears. No hearing aids   Dietary issues and exercise activities discussed: Current Exercise Habits: The patient does not participate in regular exercise at present   Goals Addressed   None    Depression Screen    06/05/2022    2:17 PM 09/22/2021   10:31 AM 06/01/2021   10:56 AM 03/22/2021   11:36 AM 05/05/2020    2:04 PM 05/05/2020    1:54 PM 05/04/2019    3:56 PM  PHQ 2/9 Scores  PHQ - 2 Score 0 0 0 4 0 0 0  PHQ- 9 Score    13       Fall Risk    06/05/2022    2:16 PM 09/22/2021   10:31 AM 06/01/2021   10:57 AM 03/22/2021   11:36 AM 09/12/2020    8:41 AM  Fall Risk   Falls in the past year? 0 0 0 0 0  Number falls in past yr: 0 0 0 0 0  Injury with Fall? 0 0 0 0 0  Risk for fall due to : No Fall Risks No Fall Risks     Follow up Falls evaluation completed Falls evaluation completed       FALL RISK PREVENTION PERTAINING TO THE HOME:  Any stairs in or around the home? No  If so, are there any without handrails?  na Home free of loose throw rugs in walkways, pet beds, electrical cords, etc? Yes  Adequate lighting in your home to reduce risk of falls? Yes   ASSISTIVE DEVICES UTILIZED TO PREVENT FALLS:  Life alert? No  Use of a cane, walker or w/c? Yes  Grab bars in the bathroom? Yes  Shower chair or bench in shower? Yes  Elevated toilet seat or a handicapped toilet? No   TIMED UP AND GO:  Was the test performed? No .   Cognitive Function:    03/19/2018  10:17 AM  MMSE - Mini Mental State Exam   Orientation to time 5  Orientation to Place 5  Registration 3  Attention/ Calculation 5  Recall 3  Language- name 2 objects 2  Language- repeat 1  Language- follow 3 step command 3  Language- read & follow direction 1  Write a sentence 1  Copy design 1  Total score 30        06/05/2022    2:18 PM 06/01/2021   11:00 AM 05/05/2020    1:59 PM 05/04/2019    4:03 PM  6CIT Screen  What Year? 0 points 0 points 0 points 0 points  What month? 0 points 0 points 0 points 0 points  What time? 3 points 0 points 0 points 0 points  Count back from 20 0 points 0 points 0 points 0 points  Months in reverse 0 points 0 points 2 points 2 points  Repeat phrase 0 points 0 points 2 points 0 points  Total Score 3 points 0 points 4 points 2 points    Immunizations Immunization History  Administered Date(s) Administered   Fluad Quad(high Dose 65+) 09/10/2019, 09/12/2020   Hep A / Hep B 01/13/2018, 02/12/2018, 07/14/2018   Influenza Inj Mdck Quad Pf 10/12/2013   Influenza, High Dose Seasonal PF 09/09/2017, 10/06/2018   Influenza-Unspecified 09/10/2007, 09/22/2008, 10/15/2012, 09/29/2014, 08/24/2016   PFIZER(Purple Top)SARS-COV-2 Vaccination 02/19/2020, 03/16/2020   Pneumococcal Conjugate-13 12/30/2017   Pneumococcal Polysaccharide-23 06/25/2013   Tdap 09/02/2015   Zoster Recombinat (Shingrix) 06/25/2017, 10/03/2017   Zoster, Live 12/28/2013    TDAP status: Up to date  Flu Vaccine status: Up to date  Pneumococcal vaccine status: Up to date  Covid-19 vaccine status: Information provided on how to obtain vaccines.   Qualifies for Shingles Vaccine? Yes   Zostavax completed No   Shingrix Completed?: Yes  Screening Tests Health Maintenance  Topic Date Due   COVID-19 Vaccine (3 - Pfizer risk series) 04/13/2020   COLONOSCOPY (Pts 45-46yr Insurance coverage will need to be confirmed)  09/10/2021   MAMMOGRAM  12/23/2021   INFLUENZA VACCINE  07/24/2022   TETANUS/TDAP  09/01/2025   Pneumonia  Vaccine 76 Years old  Completed   DEXA SCAN  Completed   Hepatitis C Screening  Completed   Zoster Vaccines- Shingrix  Completed   HPV VACCINES  Aged Out    Health Maintenance  Health Maintenance Due  Topic Date Due   COVID-19 Vaccine (3 - Pfizer risk series) 04/13/2020   COLONOSCOPY (Pts 45-467yrInsurance coverage will need to be confirmed)  09/10/2021   MAMMOGRAM  12/23/2021    Colorectal cancer screening: Type of screening: Colonoscopy. Completed 2019. Repeat every 3 years  Mammogram status: Completed 03/01/22. Repeat every year  Bone Density status: Ordered today. Pt provided with contact info and advised to call to schedule appt.  Lung Cancer Screening: (Low Dose CT Chest recommended if Age 78-80ears, 30 pack-year currently smoking OR have quit w/in 15years.) does not qualify.   Lung Cancer Screening Referral: na  Additional Screening:  Hepatitis C Screening: does qualify; Completed 2019  Vision Screening: Recommended annual ophthalmology exams for early detection of glaucoma and other disorders of the eye. Is the patient up to date with their annual eye exam?  Yes  Who is the provider or what is the name of the office in which the patient attends annual eye exams? unsure If pt is not established with a provider, would they like to be referred to a provider to  establish care? No .   Dental Screening: Recommended annual dental exams for proper oral hygiene  Community Resource Referral / Chronic Care Management: CRR required this visit?  No   CCM required this visit?  No      Plan:     I have personally reviewed and noted the following in the patient's chart:   Medical and social history Use of alcohol, tobacco or illicit drugs  Current medications and supplements including opioid prescriptions.  Functional ability and status Nutritional status Physical activity Advanced directives List of other physicians Hospitalizations, surgeries, and ER visits in  previous 12 months Vitals Screenings to include cognitive, depression, and falls Referrals and appointments  In addition, I have reviewed and discussed with patient certain preventive protocols, quality metrics, and best practice recommendations. A written personalized care plan for preventive services as well as general preventive health recommendations were provided to patient.     [name], NP   06/05/2022    Virtual Visit via Telephone Note  I connected with patient 06/05/22 at  2:40 PM EDT by telephone and verified that I am speaking with the correct person using two identifiers.  Location: Patient: home Provider: [facility]    I discussed the limitations, risks, security and privacy concerns of performing an evaluation and management service by telephone and the availability of in person appointments. I also discussed with the patient that there may be a patient responsible charge related to this service. The patient expressed understanding and agreed to proceed.   I discussed the assessment and treatment plan with the patient. The patient was provided an opportunity to ask questions and all were answered. The patient agreed with the plan and demonstrated an understanding of the instructions.   The patient was advised to call back or seek an in-person evaluation if the symptoms worsen or if the condition fails to improve as anticipated.  I provided 15 minutes of non-face-to-face time during this encounter.  Carlos American. Harle Battiest Avs printed and mailed

## 2022-06-05 NOTE — Patient Instructions (Signed)
Christina [name] , Thank you for taking time to come for your Medicare Wellness Visit. I appreciate your ongoing commitment to your health goals. Please review the following plan we discussed and let me know if I can assist you in the future.   Screening recommendations/referrals: Colonoscopy- plans to schedule Mammogram up to date Bone Density ordered today Recommended yearly ophthalmology/optometry visit for glaucoma screening and checkup Recommended yearly dental visit for hygiene and checkup  Vaccinations: Influenza vaccine up to date Pneumococcal vaccine up to date Tdap vaccine up to date Shingles vaccine up to date    Advanced directives: on file.   Conditions/risks identified: advance age  Next appointment: yearly    Preventive Care 25 Years and Older, Female Preventive care refers to lifestyle choices and visits with your health care provider that can promote health and wellness. What does preventive care include? A yearly physical exam. This is also called an annual well check. Dental exams once or twice a year. Routine eye exams. Ask your health care provider how often you should have your eyes checked. Personal lifestyle choices, including: Daily care of your teeth and gums. Regular physical activity. Eating a healthy diet. Avoiding tobacco and drug use. Limiting alcohol use. Practicing safe sex. Taking low-dose aspirin every day. Taking vitamin and mineral supplements as recommended by your health care provider. What happens during an annual well check? The services and screenings done by your health care provider during your annual well check will depend on your age, overall health, lifestyle risk factors, and family history of disease. Counseling  Your health care provider may ask you questions about your: Alcohol use. Tobacco use. Drug use. Emotional well-being. Home and relationship well-being. Sexual activity. Eating habits. History of falls. Memory and  ability to understand (cognition). Work and work Statistician. Reproductive health. Screening  You may have the following tests or measurements: Height, weight, and BMI. Blood pressure. Lipid and cholesterol levels. These may be checked every 5 years, or more frequently if you are over 77 years old. Skin check. Lung cancer screening. You may have this screening every year starting at age 25 if you have a 30-pack-year history of smoking and currently smoke or have quit within the past 15 years. Fecal occult blood test (FOBT) of the stool. You may have this test every year starting at age 1. Flexible sigmoidoscopy or colonoscopy. You may have a sigmoidoscopy every 5 years or a colonoscopy every 10 years starting at age 30. Hepatitis C blood test. Hepatitis B blood test. Sexually transmitted disease (STD) testing. Diabetes screening. This is done by checking your blood sugar (glucose) after you have not eaten for a while (fasting). You may have this done every 1-3 years. Bone density scan. This is done to screen for osteoporosis. You may have this done starting at age 59. Mammogram. This may be done every 1-2 years. Talk to your health care provider about how often you should have regular mammograms. Talk with your health care provider about your test results, treatment options, and if necessary, the need for more tests. Vaccines  Your health care provider may recommend certain vaccines, such as: Influenza vaccine. This is recommended every year. Tetanus, diphtheria, and acellular pertussis (Tdap, Td) vaccine. You may need a Td booster every 10 years. Zoster vaccine. You may need this after age 52. Pneumococcal 13-valent conjugate (PCV13) vaccine. One dose is recommended after age 44. Pneumococcal polysaccharide (PPSV23) vaccine. One dose is recommended after age 73. Talk to your health care provider  about which screenings and vaccines you need and how often you need them. This information is  not intended to replace advice given to you by your health care provider. Make sure you discuss any questions you have with your health care provider. Document Released: 01/06/2016 Document Revised: 08/29/2016 Document Reviewed: 10/11/2015 Elsevier Interactive Patient Education  2017 [employer] Prevention in the Home Falls can cause injuries. They can happen to people of all ages. There are many things you can do to make your home safe and to help prevent falls. What can I do on the outside of my home? Regularly fix the edges of walkways and driveways and fix any cracks. Remove anything that might make you trip as you walk through a door, such as a raised step or threshold. Trim any bushes or trees on the path to your home. Use bright outdoor lighting. Clear any walking paths of anything that might make someone trip, such as rocks or tools. Regularly check to see if handrails are loose or broken. Make sure that both sides of any steps have handrails. Any raised decks and porches should have guardrails on the edges. Have any leaves, snow, or ice cleared regularly. Use sand or salt on walking paths during winter. Clean up any spills in your garage right away. This includes oil or grease spills. What can I do in the bathroom? Use night lights. Install grab bars by the toilet and in the tub and shower. Do not use towel bars as grab bars. Use non-skid mats or decals in the tub or shower. If you need to sit down in the shower, use a plastic, non-slip stool. Keep the floor dry. Clean up any water that spills on the floor as soon as it happens. Remove soap buildup in the tub or shower regularly. Attach bath mats securely with double-sided non-slip rug tape. Do not have throw rugs and other things on the floor that can make you trip. What can I do in the bedroom? Use night lights. Make sure that you have a light by your bed that is easy to reach. Do not use any sheets or blankets that  are too big for your bed. They should not hang down onto the floor. Have a firm chair that has side arms. You can use this for support while you get dressed. Do not have throw rugs and other things on the floor that can make you trip. What can I do in the kitchen? Clean up any spills right away. Avoid walking on wet floors. Keep items that you use a lot in easy-to-reach places. If you need to reach something above you, use a strong step stool that has a grab bar. Keep electrical cords out of the way. Do not use floor polish or wax that makes floors slippery. If you must use wax, use non-skid floor wax. Do not have throw rugs and other things on the floor that can make you trip. What can I do with my stairs? Do not leave any items on the stairs. Make sure that there are handrails on both sides of the stairs and use them. Fix handrails that are broken or loose. Make sure that handrails are as long as the stairways. Check any carpeting to make sure that it is firmly attached to the stairs. Fix any carpet that is loose or worn. Avoid having throw rugs at the top or bottom of the stairs. If you do have throw rugs, attach them to the floor  with carpet tape. Make sure that you have a light switch at the top of the stairs and the bottom of the stairs. If you do not have them, ask someone to add them for you. What else can I do to help prevent falls? Wear shoes that: Do not have high heels. Have rubber bottoms. Are comfortable and fit you well. Are closed at the toe. Do not wear sandals. If you use a stepladder: Make sure that it is fully opened. Do not climb a closed stepladder. Make sure that both sides of the stepladder are locked into place. Ask someone to hold it for you, if possible. Clearly mark and make sure that you can see: Any grab bars or handrails. First and last steps. Where the edge of each step is. Use tools that help you move around (mobility aids) if they are needed. These  include: Canes. Walkers. Scooters. Crutches. Turn on the lights when you go into a dark area. Replace any light bulbs as soon as they burn out. Set up your furniture so you have a clear path. Avoid moving your furniture around. If any of your floors are uneven, fix them. If there are any pets around you, be aware of where they are. Review your medicines with your doctor. Some medicines can make you feel dizzy. This can increase your chance of falling. Ask your doctor what other things that you can do to help prevent falls. This information is not intended to replace advice given to you by your health care provider. Make sure you discuss any questions you have with your health care provider. Document Released: 10/06/2009 Document Revised: 05/17/2016 Document Reviewed: 01/14/2015 Elsevier Interactive Patient Education  2017 [employer].

## 2022-06-19 ENCOUNTER — Ambulatory Visit: Payer: Medicare Other | Admitting: Gastroenterology

## 2022-06-19 ENCOUNTER — Other Ambulatory Visit: Payer: Self-pay | Admitting: Gastroenterology

## 2022-06-22 ENCOUNTER — Other Ambulatory Visit: Payer: Self-pay | Admitting: Gastroenterology

## 2022-08-19 ENCOUNTER — Other Ambulatory Visit: Payer: Self-pay | Admitting: Gastroenterology

## 2022-08-23 ENCOUNTER — Ambulatory Visit: Payer: Medicare Other | Admitting: Gastroenterology

## 2022-08-28 ENCOUNTER — Encounter: Payer: Medicare Other | Admitting: Adult Health

## 2022-08-28 ENCOUNTER — Encounter: Payer: Self-pay | Admitting: Adult Health

## 2022-08-29 NOTE — Progress Notes (Signed)
This encounter was created in error - please disregard.

## 2022-08-30 ENCOUNTER — Encounter: Payer: Self-pay | Admitting: Orthopedic Surgery

## 2022-08-30 ENCOUNTER — Ambulatory Visit
Admission: RE | Admit: 2022-08-30 | Discharge: 2022-08-30 | Disposition: A | Discharge status: Home / Self Care | Payer: Medicare Other | Source: Ambulatory Visit | Attending: Orthopedic Surgery | Admitting: Orthopedic Surgery

## 2022-08-30 ENCOUNTER — Ambulatory Visit (INDEPENDENT_AMBULATORY_CARE_PROVIDER_SITE_OTHER): Payer: Medicare Other | Admitting: Orthopedic Surgery

## 2022-08-30 VITALS — BP 124/80 | HR 89 | Resp 16 | Ht <= 58 in | Wt 200.0 lb

## 2022-08-30 DIAGNOSIS — M25532 Pain in left wrist: Secondary | ICD-10-CM

## 2022-08-30 DIAGNOSIS — W19XXXA Unspecified fall, initial encounter: Secondary | ICD-10-CM | POA: Diagnosis not present

## 2022-08-30 DIAGNOSIS — S6992XA Unspecified injury of left wrist, hand and finger(s), initial encounter: Secondary | ICD-10-CM | POA: Diagnosis not present

## 2022-08-30 NOTE — Patient Instructions (Signed)
Referral to orthopedics made  Please go to [facility] to have xray done [place]  May take tylenol 1000 mg 2-3x daily as needed for pain  Continue to ice and rest left wrist

## 2022-08-30 NOTE — Progress Notes (Signed)
Careteam: Patient Care Team: [name], NP as PCP - General (Geriatric Medicine) Armbruster, [name], MD as Consulting Physician (Gastroenterology) [name], MD as Consulting Physician (General Surgery)  Seen by: [name], AGNP-C  PLACE OF SERVICE:  [facility] Directive information    No Known Allergies  No chief complaint on file.    HPI: Patient is a 76 y.o. female seen today for acute visit due to left wrist pain.   08/28 she fell at home. She fell forward and landed on left wrist. She reports increased left wrist pain since event. Pain rated 7/10, increased pain with movement, described as tight/pulling. Pain radiating to left forearm. She has been taking 4 tablets of ibuprofen daily. She has also been using ice/rest to left wrist. Treatment options discussed. She would like to see orthopedics.   Left radial 1+ Snuff box pain   Review of Systems:  Review of Systems  Constitutional:  Negative for malaise/fatigue.  Respiratory:  Negative for cough, shortness of breath and wheezing.   Cardiovascular:  Negative for chest pain and leg swelling.  Musculoskeletal:  Positive for falls.       Left wrist pain  Neurological:  Negative for dizziness and headaches.  Psychiatric/Behavioral:  Negative for depression. The patient is not nervous/anxious.     Past Medical History:  Diagnosis Date   Abnormal EKG    Abnormal finding on thyroid function test    Abnormal liver function test    Acute edema    Alcoholic cirrhosis ([place]) 01/75/1025   Possible NASH overlap ([facility] 13)   Anemia    Anxiety    Arthritis    Ascites    Back pain    Breast mass    Chronic low back pain    Complete right rotator cuff tear    Complication of anesthesia    during first surgery- woke up during surgery many years ago   Compression fracture of L4 lumbar vertebra    Dermatitis, eczematoid    Difficulty breathing    Gallstones    History of adenocarcinoma of  breast    Hypercholesterolemia    Hyperkalemia    Hypertension    Hypokalemia    Hypothyroidism    Insomnia    Knee pain    Leukocytosis    Lymphadenopathy    Macrocytosis    Memory loss or impairment    Menopause    MGUS (monoclonal gammopathy of unknown significance)    Osteoarthritis    Osteoarthritis of right knee    Other cirrhosis of liver (HCC)    Renal insufficiency syndrome    Right knee DJD    Solitary thyroid nodule    Unspecified lump in the left breast, unspecified quadrant    Vitamin B 12 deficiency    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[place])    Past Surgical History:  Procedure Laterality Date   BREAST SURGERY  2014   Removed lymphnodes   EYE SURGERY  2006   bilateral cataract   JOINT REPLACEMENT     right  (2017)and left knee   LAPAROSCOPIC CHOLECYSTECTOMY SINGLE SITE WITH INTRAOPERATIVE CHOLANGIOGRAM N/A 02/19/2018   Procedure: LAPAROSCOPIC CHOLECYSTECTOMY;  Surgeon: [name], MD;  Location: [facility];  Service: General;  Laterality: N/A;   LESION REMOVAL  09/2015   tubular adenoma-4 subcentimeter lesions   LIVER BIOPSY  02/19/2018   Procedure: LIVER BIOPSY;  Surgeon: [name], MD;  Location: [facility];  Service: General;;   right thumb surgery  SKIN CANCER EXCISION Right    Arm   TONSILLECTOMY     TOTAL KNEE ARTHROPLASTY Right 27/05/2375   UMBILICAL HERNIA REPAIR  02/19/2018   Procedure: REPAIR OF INCARCERATED UMBILICAL HERNIA;  Surgeon: [name], MD;  Location: [facility];  Service: General;;   Social History:   reports that she has never smoked. She has never used smokeless tobacco. She reports that she does not drink alcohol and does not use drugs.  Family History  Problem Relation Age of Onset   Breast cancer Mother    Colon cancer Neg Hx    Stomach cancer Neg Hx    Rectal cancer Neg Hx    Liver cancer Neg Hx    Esophageal cancer Neg Hx     Medications: Patient's Medications  New Prescriptions   No medications on file  Previous  Medications   CHOLECALCIFEROL (VITAMIN D3) 2000 UNITS TABS    Take 1 tablet by mouth every morning.   FUROSEMIDE (LASIX) 20 MG TABLET    Take 0.5 tablets (10 mg total) by mouth daily. MUST KEEP APPOINTMENT FOR FURTHER REFILLS.   GABAPENTIN (NEURONTIN) 300 MG CAPSULE    Take 1 capsule (300 mg total) by mouth 2 (two) times daily.   IBUPROFEN (ADVIL) 200 MG TABLET    Take 200 mg by mouth as needed.   KETOCONAZOLE (NIZORAL) 2 % SHAMPOO    APPLY TO AFFECTED AREA(S) 2 TIMES PER WEEK   LACTULOSE (CHRONULAC) 10 GM/15ML SOLUTION    Take 15 mLs (10 g total) by mouth daily.   LEVOTHYROXINE (SYNTHROID) 100 MCG TABLET    TAKE ONE TABLET BY MOUTH DAILY   MULTIPLE VITAMIN (MULTIVITAMIN) TABLET    Take 1 tablet by mouth daily.   PROPRANOLOL (INDERAL) 20 MG TABLET    Take 1 tablet (20 mg total) by mouth every morning.   SPIRONOLACTONE (ALDACTONE) 25 MG TABLET    TAKE ONE TABLET BY MOUTH DAILY   VENLAFAXINE XR (EFFEXOR-XR) 75 MG 24 HR CAPSULE    TAKE ONE CAPSULE BY MOUTH EVERY MORNING WITH BREAKFAST   VITAMIN B-12 (CYANOCOBALAMIN) 1000 MCG TABLET    Take 1,000 mcg by mouth every morning.  Modified Medications   No medications on file  Discontinued Medications   No medications on file    Physical Exam:  Vitals:   08/30/22 1055  BP: 124/80  Pulse: 89  Resp: 16  SpO2: 98%  Weight: 200 lb (90.7 kg)  Height: 4' 9"  (1.448 m)   Body mass index is 43.28 kg/m. Wt Readings from Last 3 Encounters:  08/30/22 200 lb (90.7 kg)  04/02/22 197 lb (89.4 kg)  09/22/21 188 lb (85.3 kg)    Physical Exam Vitals reviewed.  Constitutional:      General: She is not in acute distress. HENT:     Head: Normocephalic.  Eyes:     General:        Right eye: No discharge.        Left eye: No discharge.  Cardiovascular:     Rate and Rhythm: Normal rate and regular rhythm.     Pulses: Normal pulses.     Heart sounds: Normal heart sounds.  Pulmonary:     Effort: Pulmonary effort is normal. No respiratory distress.      Breath sounds: Normal breath sounds. No wheezing.  Musculoskeletal:     Left wrist: Swelling, tenderness and snuff box tenderness present. No deformity or crepitus. Decreased range of motion.     Right lower  leg: No edema.     Left lower leg: No edema.     Comments: Radial pulse 1+, limited ROM, mild swelling, no skin breakdown  Skin:    General: Skin is warm and dry.  Neurological:     General: No focal deficit present.     Mental Status: She is alert and oriented to person, place, and time.     Gait: Gait abnormal.     Comments: Ambulates with cane  Psychiatric:        Mood and Affect: Mood normal.        Behavior: Behavior normal.     Labs reviewed: Basic Metabolic Panel: Recent Labs    09/22/21 1054 04/02/22 0917  NA  --  139  K  --  3.9  CL  --  102  CO2  --  30  GLUCOSE  --  91  BUN  --  14  CREATININE  --  0.73  CALCIUM  --  9.3  TSH 7.38* 3.96   Liver Function Tests: Recent Labs    04/02/22 0917  AST 19  ALT 12  BILITOT 0.6  PROT 7.1   No results for input(s): "LIPASE", "AMYLASE" in the last 8760 hours. No results for input(s): "AMMONIA" in the last 8760 hours. CBC: Recent Labs    04/02/22 0917  WBC 7.1  NEUTROABS 4,367  HGB 12.0  HCT 36.2  MCV 93.1  PLT 194   Lipid Panel: Recent Labs    04/02/22 0917  CHOL 187  HDL 67  LDLCALC 100*  TRIG 102  CHOLHDL 2.8   TSH: Recent Labs    09/22/21 1054 04/02/22 0917  TSH 7.38* 3.96   A1C: No results found for: "HGBA1C"   Assessment/Plan 1. Left wrist pain - 08/28 mechanical fall at home - left wrist limited ROM, snuff box tenderness, radial pulse 1+ - concerns for scaphoid fracture - DG Wrist Complete Left; Future - AMB referral to orthopedics - cont advil prn- advised to take with food - discussed taking tylenol 1000 mg 2-3x/daily prn - cont rest and ice  2. Fall, initial encounter - ambulates with cane - see above  Total time: 14 minutes. Greater than 50% of total time  spent doing patient education regarding left wrist pain including treatment/symptom management and falls prevention.     Next appt: Visit date not found  [place], [facility] [phone]

## 2022-08-31 ENCOUNTER — Ambulatory Visit (INDEPENDENT_AMBULATORY_CARE_PROVIDER_SITE_OTHER): Payer: Medicare Other | Admitting: Sports Medicine

## 2022-08-31 ENCOUNTER — Encounter: Payer: Self-pay | Admitting: Sports Medicine

## 2022-08-31 VITALS — BP 111/77 | HR 73 | Ht <= 58 in | Wt 198.0 lb

## 2022-08-31 DIAGNOSIS — S8002XA Contusion of left knee, initial encounter: Secondary | ICD-10-CM

## 2022-08-31 DIAGNOSIS — M25332 Other instability, left wrist: Secondary | ICD-10-CM

## 2022-08-31 DIAGNOSIS — M1812 Unilateral primary osteoarthritis of first carpometacarpal joint, left hand: Secondary | ICD-10-CM

## 2022-08-31 DIAGNOSIS — M25532 Pain in left wrist: Secondary | ICD-10-CM | POA: Diagnosis not present

## 2022-08-31 DIAGNOSIS — M19132 Post-traumatic osteoarthritis, left wrist: Secondary | ICD-10-CM

## 2022-08-31 NOTE — Progress Notes (Signed)
Fell 2 weeks ago; "[place]" X-rays were taken 08/30/22. Mild swelling in the wrist compared to other side. Ibuprofen for the pain; no relief.

## 2022-08-31 NOTE — Progress Notes (Addendum)
Christina [name] - 76 y.o. female MRN [id number]  Date of birth: [birth year]  Office Visit Note: Visit Date: 08/31/2022 PCP: [name], NP Referred by: Yvonna Alanis, NP  Subjective: Chief Complaint  Patient presents with   Left Wrist - Pain   HPI: Christina [name] "Christina [name]" is a pleasant 75 y.o. female who presents today for left wrist pain after fall about 2 weeks ago.  She reports she had a "[place]" like injury when she fell onto the left side with an outstretched wrist.  She had some mild pain and swelling associated with this, but that it would go away on its own.  Over the last 2 weeks the pain has remained the same or even worsened which is what prompted her to come to the doctor.  She saw her primary care physician yesterday and they obtained x-rays and had her follow-up here with orthopedics.  Pain is at the dorsal aspect of the wrist.  She notes some dorsal sided swelling.  She is able to wiggle her fingers without difficulty.  Flexion and extension of the wrist does reproduce her pain.  Pertinent ROS were reviewed with the patient and found to be negative unless otherwise specified above in HPI.   Assessment & Plan: Visit Diagnoses:  1. Acute pain of left wrist   2. Scapholunate advanced collapse of left wrist   3. 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 Disi (dorsal intercalated segment instability), left    Plan: Discussed with "Pat" that I independently reviewed her x-rays obtained from PCP, radiology review had not yet resulted, but upon my evaluation I do not see any evidence of acute fracture.  Discussed she has significant widening of the scapholunate interval as well as a significantly rotated and dorsally angulated lunate that likely represents DISI -> SLAC wrist.  Unclear on x-ray whether this is acute or chronic, although given her high-grade of arthritis throughout the hand and wrist, favor a chronic acuity.   She is 2 weeks out from her fall, given the tenderness over the distal radius, discussed putting her in a splint/cast versus brace.  Her pain is relatively well-controlled so did put her in a thumb spica short arm brace today.  Discussed the possibility of getting a CT versus MRI, however will leave this up to the discretion of my orthopedic partners, she is to see Dr. Erlinda Hong or Marlou Sa early next week to see if surgery would be an option for her. She knits and uses her hands/wrist frequently so would like to preserve this motion as much as possible. She is agreeable to this plan.  She is to ice the left knee for her contusion.  Follow-up: Return for Follow-up with Dr. Marlou Sa or Erlinda Hong early next week (Monday - Wed)   Meds & Orders: No orders of the defined types were placed in this encounter.  No orders of the defined types were placed in this encounter.  -Recommended Tylenol, okay for occasional NSAIDs but do not take consistently  Procedures: No procedures performed      Clinical History: No specialty comments available.  She reports that she has never smoked. She has never used smokeless tobacco. No results for input(s): "HGBA1C", "LABURIC" in the last 8760 hours.  Objective:   Vital Signs: BP 111/77   Pulse 73   Ht 4' 10"  (1.473 m)   Wt 198 lb (89.8 kg)   BMI 41.38 kg/m   Physical Exam  Gen: Well-appearing, in no acute distress; non-toxic CV: Regular Rate. Well-perfused. Warm.  Resp: Breathing unlabored on room air; no wheezing. Psych: Fluid speech in conversation; appropriate affect; normal thought process Neuro: Sensation intact throughout. No gross coordination deficits.   Ortho Exam -Examination of the left wrist does show some dorsal soft tissue swelling.  There is tenderness to palpation over the radial side of the distal radius as well as near the scapholunate juncture.  No significant CMC grind test.  Patient has limited active flexion and extension about the wrist with some pain in  extension.  She is neurovascularly intact distally.  Able to wiggle all 5 fingers without difficulty.  Imaging:  *Independent review of left wrist x-ray from 08/30/2022 was independently reviewed by myself.  Radiology report had not yet resulted.  My interpretation shows a gross widening of the scapholunate interval.  The patient's lunate is significantly rotated and dorsally angulated on lateral view.  She has collapse of the radiolunate joint, likely indicative of DISI or SLAC wrist.  There is severe joint space narrowing and bony spurring of the [facility] joint of the thumb.  Overall decreased bone mineral density.  I cannot appreciate any evidence of acute fracture or radius fracture.  There is chondrocalcinosis distal to the ulna.     Past Medical/Family/Surgical/Social History: Medications & Allergies reviewed per EMR, new medications updated. Patient Active Problem List   Diagnosis Date Noted   Aortic atherosclerosis ([place]) 03/22/2021   Vitamin D deficiency 10/06/2018   Liver mass 10/06/2018   Abnormality of gait and mobility 10/06/2018   Depression, recurrent ([place]) 06/23/2018   Body mass index (BMI) of 37.0-37.9 in adult 06/23/2018   Left-sided tinnitus 06/23/2018   Class 3 severe obesity due to excess calories without serious comorbidity with body mass index (BMI) of 40.0 to 44.9 in adult [facility])    Chronic cholecystitis with calculus s/p lap cholecystectomy 02/19/2018 28/31/5176   Umbilical hernia, incarcerated, s/p reduction & primary repair 02/19/2018 02/19/2018   Closed compression fracture of L4 lumbar vertebra 09/09/2017   Psychophysiological insomnia 09/09/2017   Seborrheic dermatitis of scalp 09/09/2017   Depression, major, single episode, mild ([place]) 16/06/3709   Alcoholic cirrhosis of liver  09/09/2017   History of breast cancer 09/09/2017   B12 deficiency 09/09/2017   Postoperative hypothyroidism 09/09/2017   Esophageal varices in alcoholic cirrhosis ([place]) 62/69/4854    Waldenstrom's macroglobulinemia ([place]) 09/09/2017   Hyperlipidemia 09/09/2017   Past Medical History:  Diagnosis Date   Abnormal EKG    Abnormal finding on thyroid function test    Abnormal liver function test    Acute edema    Alcoholic cirrhosis ([place]) 62/70/3500   Possible NASH overlap (MELD 13)   Anemia    Anxiety    Arthritis    Ascites    Back pain    Breast mass    Chronic low back pain    Complete right rotator cuff tear    Complication of anesthesia    during first surgery- woke up during surgery many years ago   Compression fracture of L4 lumbar vertebra    Dermatitis, eczematoid    Difficulty breathing    Gallstones    History of adenocarcinoma of breast    Hypercholesterolemia    Hyperkalemia    Hypertension    Hypokalemia    Hypothyroidism    Insomnia    Knee pain    Leukocytosis    Lymphadenopathy    Macrocytosis    Memory loss or impairment  Menopause    MGUS (monoclonal gammopathy of unknown significance)    Osteoarthritis    Osteoarthritis of right knee    Other cirrhosis of liver (HCC)    Renal insufficiency syndrome    Right knee DJD    Solitary thyroid nodule    Unspecified lump in the left breast, unspecified quadrant    Vitamin B 12 deficiency    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HCC)    Family History  Problem Relation Age of Onset   Breast cancer Mother    Colon cancer Neg Hx    Stomach cancer Neg Hx    Rectal cancer Neg Hx    Liver cancer Neg Hx    Esophageal cancer Neg Hx    Past Surgical History:  Procedure Laterality Date   BREAST SURGERY  2014   Removed lymphnodes   EYE SURGERY  2006   bilateral cataract   JOINT REPLACEMENT     right  (2017)and left knee   LAPAROSCOPIC CHOLECYSTECTOMY SINGLE SITE WITH INTRAOPERATIVE CHOLANGIOGRAM N/A 02/19/2018   Procedure: LAPAROSCOPIC CHOLECYSTECTOMY;  Surgeon: [name], MD;  Location: [facility];  Service: General;  Laterality: N/A;   LESION REMOVAL  09/2015   tubular adenoma-4  subcentimeter lesions   LIVER BIOPSY  02/19/2018   Procedure: LIVER BIOPSY;  Surgeon: [name], MD;  Location: [facility];  Service: General;;   right thumb surgery     SKIN CANCER EXCISION Right    Arm   TONSILLECTOMY     TOTAL KNEE ARTHROPLASTY Right 26/83/4196   UMBILICAL HERNIA REPAIR  02/19/2018   Procedure: REPAIR OF INCARCERATED UMBILICAL HERNIA;  Surgeon: [name], MD;  Location: [facility];  Service: General;;   Social History   Occupational History   Occupation: retired  Tobacco Use   Smoking status: Never   Smokeless tobacco: Never  Vaping Use   Vaping Use: Never used  Substance and Sexual Activity   Alcohol use: No    Comment: 6 or more per day in the past.    Drug use: No   Sexual activity: Not Currently   I spent 52 minutes in the care of the patient today including face-to-face time, preparation to see the patient, as well as review of previous imaging and independent interpreting of results, contacting radiology department, counseling and educating the patient on her acute versus chronic injury and discussing general overview of possible surgery to be performed by one of my orthopedic colleagues (Dr. Erlinda Hong) for the above diagnoses.

## 2022-09-04 ENCOUNTER — Ambulatory Visit: Payer: Medicare Other | Admitting: Orthopaedic Surgery

## 2022-09-05 ENCOUNTER — Ambulatory Visit (HOSPITAL_COMMUNITY): Payer: Medicare Other

## 2022-09-11 ENCOUNTER — Ambulatory Visit (INDEPENDENT_AMBULATORY_CARE_PROVIDER_SITE_OTHER): Payer: Medicare Other | Admitting: Orthopaedic Surgery

## 2022-09-11 DIAGNOSIS — M19132 Post-traumatic osteoarthritis, left wrist: Secondary | ICD-10-CM

## 2022-09-11 DIAGNOSIS — M25532 Pain in left wrist: Secondary | ICD-10-CM | POA: Diagnosis not present

## 2022-09-11 NOTE — Progress Notes (Signed)
Office Visit Note   Patient: Christina [name]           Date of Birth: 14-Jun-1946           MRN: [id number] Visit Date: 09/11/2022              Requested by: [name], NP [place],  [place] [address] PCP: [name], NP   Assessment & Plan: Visit Diagnoses:  1. Acute pain of left wrist   2. Scapholunate advanced collapse of left wrist     Plan: X-rays reviewed which does show chronic changes of slack wrist and DISI deformity but explained that those are chronic changes.  My impression is that she is more symptomatic from the [facility] and STT arthritis which is where clinically her symptoms are as well.  I recommend continued immobilization for another few weeks and then can wean the thumb spica brace as tolerated.  She can increase activity to the hand as tolerated as well.  She does a lot of knitting and is eager to return to that.  Follow-up as needed.  Follow-Up Instructions: No follow-ups on file.   Orders:  No orders of the defined types were placed in this encounter.  No orders of the defined types were placed in this encounter.     Procedures: No procedures performed   Clinical Data: No additional findings.   Subjective: Chief Complaint  Patient presents with   Left Wrist - Pain    HPI Christina [name] is a very pleasant 76 year old female referral from Dr. Rolena Infante for left wrist injury status post mechanical fall on outstretched hand 3 weeks ago.  States that she has felt slight improvement.  Currently wearing a thumb spica brace.  Review of Systems   Objective: Vital Signs: There were no vitals taken for this visit.  Physical Exam  Ortho Exam Examination of the left wrist shows swelling to the thumb and index finger CMC region.  Wrist range of motion is near normal and well-tolerated.  Specialty Comments:  No specialty comments available.  Imaging: No results found.   PMFS History: Patient Active Problem List   Diagnosis Date  Noted   Aortic atherosclerosis ([place]) 03/22/2021   Vitamin D deficiency 10/06/2018   Liver mass 10/06/2018   Abnormality of gait and mobility 10/06/2018   Depression, recurrent ([place]) 06/23/2018   Body mass index (BMI) of 37.0-37.9 in adult 06/23/2018   Left-sided tinnitus 06/23/2018   Class 3 severe obesity due to excess calories without serious comorbidity with body mass index (BMI) of 40.0 to 44.9 in adult [facility])    Chronic cholecystitis with calculus s/p lap cholecystectomy 02/19/2018 41/66/0630   Umbilical hernia, incarcerated, s/p reduction & primary repair 02/19/2018 02/19/2018   Closed compression fracture of L4 lumbar vertebra 09/09/2017   Psychophysiological insomnia 09/09/2017   Seborrheic dermatitis of scalp 09/09/2017   Depression, major, single episode, mild ([place]) 16/12/930   Alcoholic cirrhosis of liver  09/09/2017   History of breast cancer 09/09/2017   B12 deficiency 09/09/2017   Postoperative hypothyroidism 09/09/2017   Esophageal varices in alcoholic cirrhosis ([place]) 35/57/3220   Waldenstrom's macroglobulinemia ([place]) 09/09/2017   Hyperlipidemia 09/09/2017   Past Medical History:  Diagnosis Date   Abnormal EKG    Abnormal finding on thyroid function test    Abnormal liver function test    Acute edema    Alcoholic cirrhosis ([place]) 25/42/7062   Possible NASH overlap (MELD 13)   Anemia    Anxiety  Arthritis    Ascites    Back pain    Breast mass    Chronic low back pain    Complete right rotator cuff tear    Complication of anesthesia    during first surgery- woke up during surgery many years ago   Compression fracture of L4 lumbar vertebra    Dermatitis, eczematoid    Difficulty breathing    Gallstones    History of adenocarcinoma of breast    Hypercholesterolemia    Hyperkalemia    Hypertension    Hypokalemia    Hypothyroidism    Insomnia    Knee pain    Leukocytosis    Lymphadenopathy    Macrocytosis    Memory loss or impairment    Menopause     MGUS (monoclonal gammopathy of unknown significance)    Osteoarthritis    Osteoarthritis of right knee    Other cirrhosis of liver (HCC)    Renal insufficiency syndrome    Right knee DJD    Solitary thyroid nodule    Unspecified lump in the left breast, unspecified quadrant    Vitamin B 12 deficiency    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HCC)     Family History  Problem Relation Age of Onset   Breast cancer Mother    Colon cancer Neg Hx    Stomach cancer Neg Hx    Rectal cancer Neg Hx    Liver cancer Neg Hx    Esophageal cancer Neg Hx     Past Surgical History:  Procedure Laterality Date   BREAST SURGERY  2014   Removed lymphnodes   EYE SURGERY  2006   bilateral cataract   JOINT REPLACEMENT     right  (2017)and left knee   LAPAROSCOPIC CHOLECYSTECTOMY SINGLE SITE WITH INTRAOPERATIVE CHOLANGIOGRAM N/A 02/19/2018   Procedure: LAPAROSCOPIC CHOLECYSTECTOMY;  Surgeon: [name], MD;  [facility];  Service: General;  Laterality: N/A;   LESION REMOVAL  09/2015   tubular adenoma-4 subcentimeter lesions   LIVER BIOPSY  02/19/2018   Procedure: LIVER BIOPSY;  Surgeon: [name], MD;  [facility];  Service: General;;   right thumb surgery     SKIN CANCER EXCISION Right    Arm   TONSILLECTOMY     TOTAL KNEE ARTHROPLASTY Right 49/44/9675   UMBILICAL HERNIA REPAIR  02/19/2018   Procedure: REPAIR OF INCARCERATED UMBILICAL HERNIA;  Surgeon: [name], MD;  [facility];  Service: General;;   Social History   Occupational History   Occupation: retired  Tobacco Use   Smoking status: Never   Smokeless tobacco: Never  Vaping Use   Vaping Use: Never used  Substance and Sexual Activity   Alcohol use: No    Comment: 6 or more per day in the past.    Drug use: No   Sexual activity: Not Currently

## 2022-09-12 ENCOUNTER — Ambulatory Visit (HOSPITAL_COMMUNITY): Payer: Medicare Other

## 2022-09-14 ENCOUNTER — Other Ambulatory Visit: Payer: Self-pay | Admitting: Gastroenterology

## 2022-09-20 DIAGNOSIS — Z85828 Personal history of other malignant neoplasm of skin: Secondary | ICD-10-CM | POA: Diagnosis not present

## 2022-09-20 DIAGNOSIS — L578 Other skin changes due to chronic exposure to nonionizing radiation: Secondary | ICD-10-CM | POA: Diagnosis not present

## 2022-09-20 DIAGNOSIS — L570 Actinic keratosis: Secondary | ICD-10-CM | POA: Diagnosis not present

## 2022-10-04 ENCOUNTER — Telehealth: Payer: Self-pay

## 2022-10-04 DIAGNOSIS — K7460 Unspecified cirrhosis of liver: Secondary | ICD-10-CM

## 2022-10-04 NOTE — Telephone Encounter (Signed)
Called and spoke with patient's daughter Jackelyn Poling. Jackelyn Poling is aware that patient is due for routine RUQ [place]. She is aware that we have placed the order and radiology will contact her directly to schedule patient's appt. Debbie verbalized understanding of all information and had no concerns at the end of the call.  RUQ [place] order in epic. Secure staff message sent to radiogy scheduling to contact patient's daughter to set up [place] appt.

## 2022-10-04 NOTE — Telephone Encounter (Signed)
-----   Message from Yevette Edwards, RN sent at 04/04/2022 10:54 AM EDT ----- Regarding: RUQ [place] RUQ [place] - cirrhosis, [place] screening Need to enter order

## 2022-10-05 ENCOUNTER — Ambulatory Visit: Payer: Medicare Other | Admitting: Nurse Practitioner

## 2022-10-19 ENCOUNTER — Ambulatory Visit (HOSPITAL_COMMUNITY): Payer: Medicare Other

## 2022-10-22 ENCOUNTER — Other Ambulatory Visit: Payer: Self-pay | Admitting: Nurse Practitioner

## 2022-10-22 DIAGNOSIS — I8510 Secondary esophageal varices without bleeding: Secondary | ICD-10-CM

## 2022-10-23 ENCOUNTER — Ambulatory Visit (HOSPITAL_COMMUNITY): Payer: Medicare Other

## 2022-10-24 ENCOUNTER — Other Ambulatory Visit (INDEPENDENT_AMBULATORY_CARE_PROVIDER_SITE_OTHER): Payer: Medicare Other

## 2022-10-24 ENCOUNTER — Encounter: Payer: Self-pay | Admitting: Gastroenterology

## 2022-10-24 ENCOUNTER — Ambulatory Visit (INDEPENDENT_AMBULATORY_CARE_PROVIDER_SITE_OTHER): Payer: Medicare Other | Admitting: Gastroenterology

## 2022-10-24 VITALS — BP 122/72 | HR 67 | Ht <= 58 in | Wt 202.8 lb

## 2022-10-24 DIAGNOSIS — I8510 Secondary esophageal varices without bleeding: Secondary | ICD-10-CM

## 2022-10-24 DIAGNOSIS — K7682 Hepatic encephalopathy: Secondary | ICD-10-CM

## 2022-10-24 DIAGNOSIS — I8500 Esophageal varices without bleeding: Secondary | ICD-10-CM

## 2022-10-24 DIAGNOSIS — Z86010 Personal history of colonic polyps: Secondary | ICD-10-CM

## 2022-10-24 DIAGNOSIS — K7030 Alcoholic cirrhosis of liver without ascites: Secondary | ICD-10-CM

## 2022-10-24 LAB — CBC WITH DIFFERENTIAL/PLATELET
Basophils Absolute: 0.1 10*3/uL (ref 0.0–0.1)
Basophils Relative: 0.8 % (ref 0.0–3.0)
Eosinophils Absolute: 0.2 10*3/uL (ref 0.0–0.7)
Eosinophils Relative: 1.9 % (ref 0.0–5.0)
HCT: 35 % — ABNORMAL LOW (ref 36.0–46.0)
Hemoglobin: 11.8 g/dL — ABNORMAL LOW (ref 12.0–15.0)
Lymphocytes Relative: 27.9 % (ref 12.0–46.0)
Lymphs Abs: 2.4 10*3/uL (ref 0.7–4.0)
MCHC: 33.8 g/dL (ref 30.0–36.0)
MCV: 92.8 fl (ref 78.0–100.0)
Monocytes Absolute: 1 10*3/uL (ref 0.1–1.0)
Monocytes Relative: 11.6 % (ref 3.0–12.0)
Neutro Abs: 4.9 10*3/uL (ref 1.4–7.7)
Neutrophils Relative %: 57.8 % (ref 43.0–77.0)
Platelets: 224 10*3/uL (ref 150.0–400.0)
RBC: 3.77 Mil/uL — ABNORMAL LOW (ref 3.87–5.11)
RDW: 12.9 % (ref 11.5–15.5)
WBC: 8.5 10*3/uL (ref 4.0–10.5)

## 2022-10-24 LAB — COMPREHENSIVE METABOLIC PANEL
ALT: 13 U/L (ref 0–35)
AST: 21 U/L (ref 0–37)
Albumin: 3.8 g/dL (ref 3.5–5.2)
Alkaline Phosphatase: 54 U/L (ref 39–117)
BUN: 15 mg/dL (ref 6–23)
CO2: 32 mEq/L (ref 19–32)
Calcium: 9.4 mg/dL (ref 8.4–10.5)
Chloride: 98 mEq/L (ref 96–112)
Creatinine, Ser: 0.7 mg/dL (ref 0.40–1.20)
GFR: 83.17 mL/min (ref >60.00)
Glucose, Bld: 90 mg/dL (ref 70–99)
Potassium: 4 mEq/L (ref 3.5–5.1)
Sodium: 137 mEq/L (ref 135–145)
Total Bilirubin: 0.7 mg/dL (ref 0.2–1.2)
Total Protein: 7.6 g/dL (ref 6.0–8.3)

## 2022-10-24 LAB — PROTIME-INR
INR: 1 ratio (ref 0.8–1.0)
Prothrombin Time: 11.2 s (ref 9.6–13.1)

## 2022-10-24 MED ORDER — PROPRANOLOL HCL 10 MG PO TABS: 10.0000 mg | ORAL_TABLET | Freq: Two times a day (BID) | ORAL | 1 refills | Status: DC

## 2022-10-24 MED ORDER — FUROSEMIDE 20 MG PO TABS: 10.0000 mg | ORAL_TABLET | Freq: Every day | ORAL | 3 refills | Status: DC

## 2022-10-24 NOTE — Progress Notes (Signed)
HPI :  76 year old female here for a follow-up visit for cirrhosis.  She was diagnosed when she lived in [place] previously a few years ago, due to alcohol, she drank heavily when caring for her husband who had dementia in the past.  She previously had a prior liver biopsy done with Dr. Johney Maine at the time of her cholecystectomy which showed cirrhosis, Mallory bodies on biopsy.  Negative serologic work-up.  She had a history of small esophageal varices in the past has been on propranolol, lower dose than usual due to low resting heart rate but she has tolerated it.  She has been on low-dose Lasix and Aldactone for edema/fluid retention and she has had a history of mild hepatic encephalopathy controlled with lactulose.  I have not seen her since August of last summer.  She states she has been doing pretty well since that time.  She has had no decompensating events that we are aware of.  She takes Lasix 10 mg daily and Aldactone 25 mg daily.  She has been cutting a 20 mg tablet of Lasix in half and she states it crumbling and hard to dose, asking for 10 mg tablet.  She is compliant with her lactulose.  She states she has been taking propranolol 20 mg every morning, we discussed perhaps lowering the dose and having her take it twice daily.  She states she is tolerating her medications okay.  She states she has been having some wine occasionally at times and feels that she needs to cut back.  She had been completely abstinent from alcohol previously.  She denies any problems with her bowels.  No blood in her stools.  She was post to have a surveillance colonoscopy with me after our last visit for history of polyps but she canceled the procedure due to an acute issue and never rescheduled.  She denies any cardiopulmonary symptoms, otherwise feeling well.  She had an ultrasound done in April for [facility] screening which showed no new or concerning changes.    Prior endoscopic workup  Colonoscopy - 10/13/2015 - Dr.  Adolph Pollack at [facility] [facility]. - exam was a poor prep which was aborted in the left colon, told to repeat with 2 day prep.  Colonoscopy - 10/20/2015 - Dr. Adolph Pollack - 3 ascending polyps 5-63m in size, one small sigmoid polyp removed, diverticulosis - fair bowel prep - recommended 3 year follow up Colonoscopy 09/10/2018 - 8 small polyps - largest 858min size, diverticulosis, hemorrhoids - Repeat 3 years   2016 - EGD - small esophageal varices, mild PHG    MRI liver 09/11/19 - IMPRESSION: 1. Hepatic cirrhosis with fibrosis and architectural distortion. No liver mass identified 2. Splenorenal shunting indicating portal venous hypertension. 3. Descending and sigmoid colon diverticulosis. 4.  Aortic Atherosclerosis (ICD10-I70.0).     [place]domen 03/17/21 - stable cirrhosis changes     RUQ [place]/11/23: IMPRESSION: 1. Cirrhotic changes in the liver.  No liver mass identified. 2. No other significant abnormalities.     Past Medical History:  Diagnosis Date   Abnormal EKG    Abnormal finding on thyroid function test    Abnormal liver function test    Acute edema    Alcoholic cirrhosis (HCC) 0720/35/5974 Possible NASH overlap (MELD 13)   Anemia    Anxiety    Arthritis    Ascites    Back pain    Breast mass    Chronic low back pain  Complete right rotator cuff tear    Complication of anesthesia    during first surgery- woke up during surgery many years ago   Compression fracture of L4 lumbar vertebra    Dermatitis, eczematoid    Difficulty breathing    Gallstones    History of adenocarcinoma of breast    Hypercholesterolemia    Hyperkalemia    Hypertension    Hypokalemia    Hypothyroidism    Insomnia    Knee pain    Leukocytosis    Lymphadenopathy    Macrocytosis    Memory loss or impairment    Menopause    MGUS (monoclonal gammopathy of unknown significance)    Osteoarthritis    Osteoarthritis of right knee    Other cirrhosis of liver (HCC)    Renal  insufficiency syndrome    Right knee DJD    Solitary thyroid nodule    Unspecified lump in the left breast, unspecified quadrant    Vitamin B 12 deficiency    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[place])      Past Surgical History:  Procedure Laterality Date   BREAST SURGERY  2014   Removed lymphnodes   EYE SURGERY  2006   bilateral cataract   JOINT REPLACEMENT     right  (2017)and left knee   LAPAROSCOPIC CHOLECYSTECTOMY SINGLE SITE WITH INTRAOPERATIVE CHOLANGIOGRAM N/A 02/19/2018   Procedure: LAPAROSCOPIC CHOLECYSTECTOMY;  Surgeon: [name], MD;  Location: [facility];  Service: General;  Laterality: N/A;   LESION REMOVAL  09/2015   tubular adenoma-4 subcentimeter lesions   LIVER BIOPSY  02/19/2018   Procedure: LIVER BIOPSY;  Surgeon: [name], MD;  Location: [facility];  Service: General;;   right thumb surgery     SKIN CANCER EXCISION Right    Arm   TONSILLECTOMY     TOTAL KNEE ARTHROPLASTY Right 16/09/9603   UMBILICAL HERNIA REPAIR  02/19/2018   Procedure: REPAIR OF INCARCERATED UMBILICAL HERNIA;  Surgeon: [name], MD;  Location: [facility];  Service: General;;   Family History  Problem Relation Age of Onset   Breast cancer Mother    Colon cancer Neg Hx    Stomach cancer Neg Hx    Rectal cancer Neg Hx    Liver cancer Neg Hx    Esophageal cancer Neg Hx    Social History   Tobacco Use   Smoking status: Never   Smokeless tobacco: Never  Vaping Use   Vaping Use: Never used  Substance Use Topics   Alcohol use: No    Comment: 6 or more per day in the past.    Drug use: No   Current Outpatient Medications  Medication Sig Dispense Refill   Cholecalciferol (VITAMIN D3) 2000 units TABS Take 1 tablet by mouth every morning.     furosemide (LASIX) 20 MG tablet Take 0.5 tablets (10 mg total) by mouth daily. MUST KEEP APPOINTMENT FOR FURTHER REFILLS. (Patient taking differently: Take 10 mg by mouth daily.) 30 tablet 0   gabapentin (NEURONTIN) 300 MG capsule Take 1 capsule  (300 mg total) by mouth 2 (two) times daily. 180 capsule 3   ibuprofen (ADVIL) 200 MG tablet Take 200 mg by mouth as needed.     ketoconazole (NIZORAL) 2 % shampoo APPLY TO AFFECTED AREA(S) 2 TIMES PER WEEK 120 mL 2   lactulose (CHRONULAC) 10 GM/15ML solution Take 15 mLs (10 g total) by mouth daily. 1350 mL 3   levothyroxine (SYNTHROID) 100 MCG tablet TAKE ONE TABLET BY MOUTH DAILY 90  tablet 3   Multiple Vitamin (MULTIVITAMIN) tablet Take 1 tablet by mouth daily.     propranolol (INDERAL) 20 MG tablet TAKE ONE TABLET BY MOUTH EVERY MORNING 90 tablet 3   spironolactone (ALDACTONE) 25 MG tablet TAKE 1 TABLET BY MOUTH DAILY 90 tablet 0   venlafaxine XR (EFFEXOR-XR) 75 MG 24 hr capsule TAKE ONE CAPSULE BY MOUTH EVERY MORNING WITH BREAKFAST 90 capsule 1   vitamin B-12 (CYANOCOBALAMIN) 1000 MCG tablet Take 1,000 mcg by mouth every morning.     No current facility-administered medications for this visit.   No Known Allergies   Review of Systems: All systems reviewed and negative except where noted in HPI.   Lab Results  Component Value Date   WBC 7.1 04/02/2022   HGB 12.0 04/02/2022   HCT 36.2 04/02/2022   MCV 93.1 04/02/2022   PLT 194 04/02/2022    Lab Results  Component Value Date   CREATININE 0.73 04/02/2022   BUN 14 04/02/2022   NA 139 04/02/2022   K 3.9 04/02/2022   CL 102 04/02/2022   CO2 30 04/02/2022    Lab Results  Component Value Date   ALT 12 04/02/2022   AST 19 04/02/2022   ALKPHOS 63 08/21/2021   BILITOT 0.6 04/02/2022    Lab Results  Component Value Date   INR 1.1 (H) 08/21/2021   INR 1.1 (H) 02/03/2021   INR 1.1 (H) 09/07/2020     Physical Exam: BP 122/72   Pulse 67   Ht 4' 10"  (1.473 m)   Wt 202 lb 12.8 oz (92 kg)   SpO2 97%   BMI 42.39 kg/m  Constitutional: Pleasant,well-developed, female in no acute distress. Abdominal: Soft, nondistended, nontender.  Extremities: no edema Neurological: Alert and oriented to person place and  time. Psychiatric: Normal mood and affect. Behavior is normal.   ASSESSMENT: 76 y.o. female here for assessment of the following  1. Alcoholic cirrhosis, unspecified whether ascites present ([place])   2. Hepatic encephalopathy (HCC)   3. Esophageal varices without bleeding, unspecified esophageal varices type ([place])   4. History of colon polyps    Stable cirrhosis over the past year or so, she does have small varices, mild hepatic encephalopathy, mild edema all of which appear to be controlled with medications currently.  I will change her Lasix to 10 mg tablet for easier dosing.  She will continue her present dosing of lactulose.  I will change her propranolol from 20 mg daily to 10 mg twice daily.  She is due for [facility] screening, she is scheduled for an ultrasound next month.  She is due for her baseline labs, I will ask her to go to the lab today.  We discussed her alcohol use for a bit and recommend complete abstinence.  She has not candidate for transplant if she decompensates further.  She understands this and will stop her alcohol use which she states is currently social.  Otherwise reviewed her last colonoscopy with her, discussed if she wanted to have any further surveillance colonoscopy exams given her age.  Given she had numerous polyps on her last exam, she wants to have 1 more exam prior to stopping, will reschedule to have this done at the [facility].  If her next exam shows no high risk lesions she will not warrant any further exams.  PLAN: - RUQ [place] scheduled for this month - change lasix to 61m / day, continue aldactone daily - change propranolol to 172mBID from 2093m daily -  labs today - CBC, CMET, INR, AFP - colonoscopy at the [facility] for polyp history - complete alcohol abstinence - follow up every 6 months  Jolly Mango, MD [facility]

## 2022-10-24 NOTE — Patient Instructions (Addendum)
If you are age 78 or older, your body mass index should be between 23-30. Your Body mass index is 42.39 kg/m. If this is out of the aforementioned range listed, please consider follow up with your Primary Care Provider.  If you are age 54 or younger, your body mass index should be between 19-25. Your Body mass index is 42.39 kg/m. If this is out of the aformentioned range listed, please consider follow up with your Primary Care Provider.   ________________________________________________________  Please go to the lab in the basement of our building to have lab work done as you leave today. Hit "B" for basement when you get on the elevator.  When the doors open the lab is on your left.  We will call you with the results. Thank you.   We have sent the following medications to your pharmacy for you to pick up at your convenience: Lasix 10 mg: Take once daily Propranolol 10 mg: Take twice daily  Continue aldactone  Thank you for entrusting me with your care and for choosing [facility], Dr. [place] Cellar

## 2022-10-25 ENCOUNTER — Other Ambulatory Visit: Payer: Self-pay | Admitting: Gastroenterology

## 2022-10-25 LAB — AFP TUMOR MARKER: AFP-Tumor Marker: 2.3 ng/mL

## 2022-10-31 ENCOUNTER — Ambulatory Visit (HOSPITAL_COMMUNITY): Payer: Medicare Other

## 2022-11-19 ENCOUNTER — Ambulatory Visit: Payer: Medicare Other | Admitting: Nurse Practitioner

## 2022-11-27 ENCOUNTER — Encounter: Payer: Medicare Other | Admitting: Gastroenterology

## 2022-11-27 ENCOUNTER — Other Ambulatory Visit: Payer: Self-pay | Admitting: Nurse Practitioner

## 2022-11-27 DIAGNOSIS — F331 Major depressive disorder, recurrent, moderate: Secondary | ICD-10-CM

## 2022-11-28 ENCOUNTER — Encounter: Payer: Self-pay | Admitting: Gastroenterology

## 2022-11-28 ENCOUNTER — Ambulatory Visit (AMBULATORY_SURGERY_CENTER): Payer: Medicare Other | Admitting: Gastroenterology

## 2022-11-28 VITALS — BP 100/66 | HR 68 | Temp 96.8°F | Resp 14

## 2022-11-28 DIAGNOSIS — D123 Benign neoplasm of transverse colon: Secondary | ICD-10-CM

## 2022-11-28 DIAGNOSIS — D125 Benign neoplasm of sigmoid colon: Secondary | ICD-10-CM

## 2022-11-28 DIAGNOSIS — Z86010 Personal history of colonic polyps: Secondary | ICD-10-CM | POA: Diagnosis not present

## 2022-11-28 DIAGNOSIS — Z09 Encounter for follow-up examination after completed treatment for conditions other than malignant neoplasm: Secondary | ICD-10-CM | POA: Diagnosis not present

## 2022-11-28 MED ORDER — SODIUM CHLORIDE 0.9 % IV SOLN: 500.0000 mL | Freq: Once | INTRAVENOUS | Status: DC

## 2022-11-28 NOTE — Patient Instructions (Signed)
Read al of the handouts given to you by your recovery room nurse.  Resume all of your previous mediations as ordered  YOU HAD AN ENDOSCOPIC PROCEDURE TODAY AT [place]:   Refer to the procedure report that was given to you for any specific questions about what was found during the examination.  If the procedure report does not answer your questions, please call your gastroenterologist to clarify.  If you requested that your care partner not be given the details of your procedure findings, then the procedure report has been included in a sealed envelope for you to review at your convenience later.  YOU SHOULD EXPECT: Some feelings of bloating in the abdomen. Passage of more gas than usual.  Walking can help get rid of the air that was put into your GI tract during the procedure and reduce the bloating. If you had a lower endoscopy (such as a colonoscopy or flexible sigmoidoscopy) you may notice spotting of blood in your stool or on the toilet paper. If you underwent a bowel prep for your procedure, you may not have a normal bowel movement for a few days.  Please Note:  You might notice some irritation and congestion in your nose or some drainage.  This is from the oxygen used during your procedure.  There is no need for concern and it should clear up in a day or so.  SYMPTOMS TO REPORT IMMEDIATELY:  Following lower endoscopy (colonoscopy or flexible sigmoidoscopy):  Excessive amounts of blood in the stool  Significant tenderness or worsening of abdominal pains  Swelling of the abdomen that is new, acute  Fever of 100F or higher   For urgent or emergent issues, a gastroenterologist can be reached at any hour by calling [phone]. Do not use MyChart messaging for urgent concerns.    DIET:  We do recommend a small meal at first, but then you may proceed to your regular diet.  Drink plenty of fluids but you should avoid alcoholic beverages for 24 hours. Try to eat more  fiber and drink more water.  ACTIVITY:  You should plan to take it easy for the rest of today and you should NOT DRIVE or use heavy machinery until tomorrow (because of the sedation medicines used during the test).    FOLLOW UP: Our staff will call the number listed on your records the next business day following your procedure.  We will call around 7:15- 8:00 am to check on you and address any questions or concerns that you may have regarding the information given to you following your procedure. If we do not reach you, we will leave a message.     If any biopsies were taken you will be contacted by phone or by letter within the next 1-3 weeks.  Please call us at [phone] if you have not heard about the biopsies in 3 weeks.    SIGNATURES/CONFIDENTIALITY: You and/or your care partner have signed paperwork which will be entered into your electronic medical record.  These signatures attest to the fact that that the information above on your After Visit Summary has been reviewed and is understood.  Full responsibility of the confidentiality of this discharge information lies with you and/or your care-partner.

## 2022-11-28 NOTE — Progress Notes (Signed)
Dr. Havery Moros  aware of facial rash fromprep.

## 2022-11-28 NOTE — Progress Notes (Signed)
Pt's states no medical or surgical changes since previsit or office visit. 

## 2022-11-28 NOTE — Progress Notes (Signed)
Called to room to assist during endoscopic procedure.  Patient ID and intended procedure confirmed with present staff. Received instructions for my participation in the procedure from the performing physician.  

## 2022-11-28 NOTE — Progress Notes (Signed)
[facility] History and Physical   Primary Care Physician:  [name], NP   Reason for Procedure:   History of colon polyps  Plan:    colonoscopy     HPI: Christina [name] is a 76 y.o. female  here for colonoscopy surveillance - history of polyps removed 08/2018 - 8 SSPs / TAs.   Patient denies any bowel symptoms at this time. No family history of colon cancer known. Otherwise feels well without any cardiopulmonary symptoms. She had a hard time with prep, had some vomiting, abdominal soreness.   I have discussed risks / benefits of anesthesia and endoscopic procedure with Christina [name] and they wish to proceed with the exams as outlined today.    Past Medical History:  Diagnosis Date   Abnormal EKG    Abnormal finding on thyroid function test    Abnormal liver function test    Acute edema    Alcoholic cirrhosis ([place]) 30/08/2329   Possible NASH overlap (MELD 13)   Anemia    Anxiety    Arthritis    Ascites    Back pain    Breast mass    Chronic low back pain    Complete right rotator cuff tear    Complication of anesthesia    during first surgery- woke up during surgery many years ago   Compression fracture of L4 lumbar vertebra    Dermatitis, eczematoid    Difficulty breathing    Gallstones    History of adenocarcinoma of breast    Hypercholesterolemia    Hyperkalemia    Hypertension    Hypokalemia    Hypothyroidism    Insomnia    Knee pain    Leukocytosis    Lymphadenopathy    Macrocytosis    Memory loss or impairment    Menopause    MGUS (monoclonal gammopathy of unknown significance)    Osteoarthritis    Osteoarthritis of right knee    Other cirrhosis of liver (HCC)    Renal insufficiency syndrome    Right knee DJD    Solitary thyroid nodule    Unspecified lump in the left breast, unspecified quadrant    Vitamin B 12 deficiency    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[place])     Past Surgical History:  Procedure Laterality Date    BREAST SURGERY  2014   Removed lymphnodes   EYE SURGERY  2006   bilateral cataract   JOINT REPLACEMENT     right  (2017)and left knee   LAPAROSCOPIC CHOLECYSTECTOMY SINGLE SITE WITH INTRAOPERATIVE CHOLANGIOGRAM N/A 02/19/2018   Procedure: LAPAROSCOPIC CHOLECYSTECTOMY;  Surgeon: [name], MD;  [facility];  Service: General;  Laterality: N/A;   LESION REMOVAL  09/2015   tubular adenoma-4 subcentimeter lesions   LIVER BIOPSY  02/19/2018   Procedure: LIVER BIOPSY;  Surgeon: [name], MD;  [facility];  Service: General;;   right thumb surgery     SKIN CANCER EXCISION Right    Arm   TONSILLECTOMY     TOTAL KNEE ARTHROPLASTY Right 07/62/2633   UMBILICAL HERNIA REPAIR  02/19/2018   Procedure: REPAIR OF INCARCERATED UMBILICAL HERNIA;  Surgeon: [name], MD;  [facility];  Service: General;;    Prior to Admission medications   Medication Sig Start Date End Date Taking? Authorizing Provider  Cholecalciferol (VITAMIN D3) 2000 units TABS Take 1 tablet by mouth every morning.   Yes [provider]  furosemide (LASIX) 20 MG tablet Take 0.5 tablets (10 mg  total) by mouth daily. 10/24/22  Yes Robertta Halfhill, [name], MD  gabapentin (NEURONTIN) 300 MG capsule Take 1 capsule (300 mg total) by mouth 2 (two) times daily. 06/05/22  Yes [name], NP  lactulose (CHRONULAC) 10 GM/15ML solution Take 15 mLs (10 g total) by mouth daily. 10/24/20  Yes Nico Rogness, [name], MD  levothyroxine (SYNTHROID) 100 MCG tablet TAKE ONE TABLET BY MOUTH DAILY 06/01/22  Yes [name], NP  Multiple Vitamin (MULTIVITAMIN) tablet Take 1 tablet by mouth daily.   Yes [provider]  propranolol (INDERAL) 10 MG tablet Take 1 tablet (10 mg total) by mouth 2 (two) times daily. 10/24/22  Yes Maxon Kresse, [name], MD  spironolactone (ALDACTONE) 25 MG tablet TAKE 1 TABLET BY MOUTH DAILY 09/14/22  Yes Kayne Yuhas, [name], MD  venlafaxine XR (EFFEXOR-XR) 75 MG 24 hr capsule TAKE  1 CAPSULE BY MOUTH EVERY MORNING WITH BREAKFAST 11/27/22  Yes [name], NP  vitamin B-12 (CYANOCOBALAMIN) 1000 MCG tablet Take 1,000 mcg by mouth every morning.   Yes [provider]  ibuprofen (ADVIL) 200 MG tablet Take 200 mg by mouth as needed.    [provider]  ketoconazole (NIZORAL) 2 % shampoo APPLY TO AFFECTED AREA(S) 2 TIMES PER WEEK 05/15/21   [name], NP    Current Outpatient Medications  Medication Sig Dispense Refill   Cholecalciferol (VITAMIN D3) 2000 units TABS Take 1 tablet by mouth every morning.     furosemide (LASIX) 20 MG tablet Take 0.5 tablets (10 mg total) by mouth daily. 45 tablet 3   gabapentin (NEURONTIN) 300 MG capsule Take 1 capsule (300 mg total) by mouth 2 (two) times daily. 180 capsule 3   lactulose (CHRONULAC) 10 GM/15ML solution Take 15 mLs (10 g total) by mouth daily. 1350 mL 3   levothyroxine (SYNTHROID) 100 MCG tablet TAKE ONE TABLET BY MOUTH DAILY 90 tablet 3   Multiple Vitamin (MULTIVITAMIN) tablet Take 1 tablet by mouth daily.     propranolol (INDERAL) 10 MG tablet Take 1 tablet (10 mg total) by mouth 2 (two) times daily. 180 tablet 1   spironolactone (ALDACTONE) 25 MG tablet TAKE 1 TABLET BY MOUTH DAILY 90 tablet 0   venlafaxine XR (EFFEXOR-XR) 75 MG 24 hr capsule TAKE 1 CAPSULE BY MOUTH EVERY MORNING WITH BREAKFAST 90 capsule 1   vitamin B-12 (CYANOCOBALAMIN) 1000 MCG tablet Take 1,000 mcg by mouth every morning.     ibuprofen (ADVIL) 200 MG tablet Take 200 mg by mouth as needed.     ketoconazole (NIZORAL) 2 % shampoo APPLY TO AFFECTED AREA(S) 2 TIMES PER WEEK 120 mL 2   Current Facility-Administered Medications  Medication Dose Route Frequency Provider Last Rate Last Admin   0.9 %  sodium chloride infusion  500 mL Intravenous Once Manoj Enriquez, [name], MD        Allergies as of 11/28/2022   (No Known Allergies)    Family History  Problem Relation Age of Onset   Breast cancer Mother    Colon cancer Neg Hx     Stomach cancer Neg Hx    Rectal cancer Neg Hx    Liver cancer Neg Hx    Esophageal cancer Neg Hx     Social History   Socioeconomic History   Marital status: Widowed    Spouse name: Not on file   Number of children: 6   Years of education: Not on file   Highest education level: Not on file  Occupational  History   Occupation: retired  Tobacco Use   Smoking status: Never   Smokeless tobacco: Never  Vaping Use   Vaping Use: Never used  Substance and Sexual Activity   Alcohol use: No    Comment: 6 or more per day in the past.    Drug use: No   Sexual activity: Not Currently  Other Topics Concern   Not on file  Social History Narrative   Social History       Diet? Regular      Do you drink/eat things with caffeine? 1 cup      Marital status?                        married            What year were you married?      Do you live in a house, apartment, assisted living, condo, trailer, etc.? townhouse      Is it one or more stories? no      How many persons live in your home? two      Do you have any pets in your home? (please list) no      Highest level of education completed? High school      Current or past profession: realtor      Do you exercise?      no                                Type & how often?      Advanced Directives      Do you have a living will? yes      Do you have a DNR form?      yes                            If not, do you want to discuss one?      Do you have signed POA/HPOA for forms? yes      Functional Status      Do you have difficulty bathing or dressing yourself? no      Do you have difficulty preparing food or eating? no      Do you have difficulty managing your medications? no      Do you have difficulty managing your finances? no      Do you have difficulty affording your medications? no   Social Determinants of Health   Financial Resource Strain: Low Risk  (03/19/2018)   Overall Financial Resource Strain (CARDIA)     Difficulty of Paying Living Expenses: Not hard at all  Food Insecurity: No Food Insecurity (03/19/2018)   Hunger Vital Sign    Worried About Running Out of Food in the Last Year: Never true    [place] in the Last Year: Never true  Transportation Needs: No Transportation Needs (03/19/2018)   PRAPARE - Hydrologist (Medical): No    Lack of Transportation (Non-Medical): No  Physical Activity: Insufficiently Active (03/19/2018)   Exercise Vital Sign    Days of Exercise per Week: 7 days    Minutes of Exercise per Session: 20 min  Stress: Stress Concern Present (03/19/2018)   [place]    Feeling of Stress : To some extent  Social Connections: Moderately Isolated (  03/19/2018)   Social Connection and Isolation Panel [NHANES]    Frequency of Communication with Friends and Family: More than three times a week    Frequency of Social Gatherings with Friends and Family: More than three times a week    Attends Religious Services: Never    Marine scientist or Organizations: No    Attends Archivist Meetings: Never    Marital Status: Widowed  Intimate Partner Violence: Not At Risk (03/19/2018)   Humiliation, Afraid, Rape, and Kick questionnaire    Fear of Current or Ex-Partner: No    Emotionally Abused: No    Physically Abused: No    Sexually Abused: No    Review of Systems: All other review of systems negative except as mentioned in the HPI.  Physical Exam: Vital signs Temp (!) 96.8 F (36 C)   General:   Alert,  Well-developed, pleasant and cooperative in NAD Lungs:  Clear throughout to auscultation.   Heart:  Regular rate and rhythm Abdomen:  Soft, nontender and nondistended.   Neuro/Psych:  Alert and cooperative. Normal mood and affect. A and O x 3  Jolly Mango, MD [facility]

## 2022-11-28 NOTE — Progress Notes (Signed)
Sedate, gd SR, tolerated procedure well, VSS, report to RN 

## 2022-11-28 NOTE — Op Note (Addendum)
[place] Patient Name: Christina [name] Procedure Date: 11/28/2022 3:40 PM MRN: [id number] Endoscopist: Remo Lipps P. Havery Moros , MD, [id number] Age: 76 Referring MD:  Date of Birth: 1946-06-18 Gender: Female Account #: [id number] Procedure:                Colonoscopy Indications:              High risk colon cancer surveillance: Personal                            history of colonic polyps - 8 adenomas / sessile                            serrated polyps removed 08/2018 Medicines:                Monitored Anesthesia Care Procedure:                Pre-Anesthesia Assessment:                           - Prior to the procedure, a History and Physical                            was performed, and patient medications and                            allergies were reviewed. The patient's tolerance of                            previous anesthesia was also reviewed. The risks                            and benefits of the procedure and the sedation                            options and risks were discussed with the patient.                            All questions were answered, and informed consent                            was obtained. Prior Anticoagulants: The patient has                            taken no anticoagulant or antiplatelet agents. ASA                            Grade Assessment: III - A patient with severe                            systemic disease. After reviewing the risks and                            benefits, the patient was deemed in satisfactory  condition to undergo the procedure.                           After obtaining informed consent, the colonoscope                            was passed under direct vision. Throughout the                            procedure, the patient's blood pressure, pulse, and                            oxygen saturations were monitored continuously. The                            PCF-HQ190L  Colonoscope was introduced through the                            anus and advanced to the the cecum, identified by                            appendiceal orifice and ileocecal valve. The                            colonoscopy was performed without difficulty. The                            patient tolerated the procedure well. The quality                            of the bowel preparation was adequate. The                            ileocecal valve, appendiceal orifice, and rectum                            were photographed. Scope In: 3:48:24 PM Scope Out: 4:07:48 PM Scope Withdrawal Time: 0 hours 14 minutes 34 seconds  Total Procedure Duration: 0 hours 19 minutes 24 seconds  Findings:                 The perianal and digital rectal examinations were                            normal.                           Many medium-mouthed diverticula were found in the                            entire colon.                           A 2 to 3 mm polyp was found in the transverse  colon. The polyp was sessile. The polyp was removed                            with a cold snare. Resection and retrieval were                            complete.                           A 4 mm polyp was found in the sigmoid colon. The                            polyp was sessile. The polyp was removed with a                            cold snare. Resection and retrieval were complete.                           Internal hemorrhoids were found during                            retroflexion. The hemorrhoids were small.                           The exam was otherwise without abnormality. Complications:            No immediate complications. Estimated blood loss:                            Minimal. Estimated Blood Loss:     Estimated blood loss was minimal. Impression:               - Diverticulosis in the entire examined colon.                           - One 2 to 3 mm polyp in the  transverse colon,                            removed with a cold snare. Resected and retrieved.                           - One 4 mm polyp in the sigmoid colon, removed with                            a cold snare. Resected and retrieved.                           - Internal hemorrhoids.                           - The examination was otherwise normal. Recommendation:           - Patient has a contact number available for  emergencies. The signs and symptoms of potential                            delayed complications were discussed with the                            patient. Return to normal activities tomorrow.                            Written discharge instructions were provided to the                            patient.                           - Resume previous diet.                           - Continue present medications.                           - Await pathology results.                           - No further surveillance recommended given age and                            no high risk lesions on this exam [name]. Schyler Counsell, MD 11/28/2022 4:13:04 PM This report has been signed electronically.

## 2022-11-29 ENCOUNTER — Telehealth: Payer: Self-pay | Admitting: *Deleted

## 2022-11-29 NOTE — Telephone Encounter (Signed)
  Follow up Call-     11/28/2022    3:20 PM  Call back number  Post procedure Call Back phone  # [phone]  Permission to leave phone message Yes     Patient questions:   Message left to call if necessary.

## 2022-11-30 ENCOUNTER — Ambulatory Visit: Payer: Medicare Other | Admitting: Nurse Practitioner

## 2022-12-15 ENCOUNTER — Other Ambulatory Visit: Payer: Self-pay | Admitting: Gastroenterology

## 2022-12-20 ENCOUNTER — Encounter: Payer: Self-pay | Admitting: Nurse Practitioner

## 2022-12-21 ENCOUNTER — Encounter: Payer: Self-pay | Admitting: Nurse Practitioner

## 2022-12-21 ENCOUNTER — Ambulatory Visit (INDEPENDENT_AMBULATORY_CARE_PROVIDER_SITE_OTHER): Payer: Medicare Other | Admitting: Nurse Practitioner

## 2022-12-21 VITALS — BP 124/72 | HR 64 | Temp 97.1°F | Ht <= 58 in | Wt 196.0 lb

## 2022-12-21 DIAGNOSIS — C8800 Waldenstrom macroglobulinemia not having achieved remission: Secondary | ICD-10-CM

## 2022-12-21 DIAGNOSIS — F331 Major depressive disorder, recurrent, moderate: Secondary | ICD-10-CM

## 2022-12-21 DIAGNOSIS — G603 Idiopathic progressive neuropathy: Secondary | ICD-10-CM | POA: Diagnosis not present

## 2022-12-21 DIAGNOSIS — K7682 Hepatic encephalopathy: Secondary | ICD-10-CM | POA: Diagnosis not present

## 2022-12-21 DIAGNOSIS — E890 Postprocedural hypothyroidism: Secondary | ICD-10-CM

## 2022-12-21 DIAGNOSIS — Z23 Encounter for immunization: Secondary | ICD-10-CM

## 2022-12-21 NOTE — Progress Notes (Signed)
Careteam: Patient Care Team: [name], NP as PCP - General (Geriatric Medicine) Armbruster, [name], MD as Consulting Physician (Gastroenterology) [name], MD as Consulting Physician (General Surgery)  PLACE OF SERVICE:  [facility] Directive information Does Patient Have a Medical Advance Directive?: Yes, Type of Advance Directive: Out of facility DNR (pink MOST or yellow form), Pre-existing out of facility DNR order (yellow form or pink MOST form): Pink MOST form placed in chart (order not valid for inpatient use);Yellow form placed in chart (order not valid for inpatient use), Does patient want to make changes to medical advance directive?: No - Patient declined  No Known Allergies  Chief Complaint  Patient presents with   Medical Management of Chronic Issues    6 month follow-up. Discuss need for mammogram and additional covid boosters or post pone if patient refuses. Flu vaccine today. Scored 9 on depression screening, would like to discuss depression medication     HPI: Patient is a 76 y.o. female for routine follow up.  She is on effexor for depression- does not feel like it is helpful like it used to be. Feels like she is going down a rabit hole and it is sucking her in. So HI or SI  Recently had colonoscopy. Recommended follow up in 5 years  Neuropathy- feet fall asleep if she stays still for any length of time, gradually worsening.  Has a hard time holding things.   Denies swelling.   Continues to follow up with GI due to cirrhosis, plans to get repeat US liver.   Review of Systems:  Review of Systems  Constitutional:  Negative for chills, fever and weight loss.  HENT:  Negative for tinnitus.   Respiratory:  Negative for cough, sputum production and shortness of breath.   Cardiovascular:  Negative for chest pain, palpitations and leg swelling.  Gastrointestinal:  Negative for abdominal pain, constipation, diarrhea and heartburn.   Genitourinary:  Negative for dysuria, frequency and urgency.  Musculoskeletal:  Negative for back pain, falls, joint pain and myalgias.  Skin: Negative.   Neurological:  Positive for tingling. Negative for dizziness and headaches.  Psychiatric/Behavioral:  Positive for depression. Negative for memory loss. The patient does not have insomnia.     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 13)   Anemia    Anxiety    Arthritis    Ascites    Back pain    Breast mass    Chronic low back pain    Complete right rotator cuff tear    Complication of anesthesia    during first surgery- woke up during surgery many years ago   Compression fracture of L4 lumbar vertebra    Dermatitis, eczematoid    Difficulty breathing    Gallstones    History of adenocarcinoma of breast    Hypercholesterolemia    Hyperkalemia    Hypertension    Hypokalemia    Hypothyroidism    Insomnia    Knee pain    Leukocytosis    Lymphadenopathy    Macrocytosis    Memory loss or impairment    Menopause    MGUS (monoclonal gammopathy of unknown significance)    Osteoarthritis    Osteoarthritis of right knee    Other cirrhosis  of liver ([place])    Renal insufficiency syndrome    Right knee DJD    Solitary thyroid nodule    Unspecified lump in the left breast, unspecified quadrant    Vitamin B 12 deficiency    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[place])    Past Surgical History:  Procedure Laterality Date   BREAST SURGERY  2014   Removed lymphnodes   EYE SURGERY  2006   bilateral cataract   JOINT REPLACEMENT     right  (2017)and left knee   LAPAROSCOPIC CHOLECYSTECTOMY SINGLE SITE WITH INTRAOPERATIVE CHOLANGIOGRAM N/A 02/19/2018   Procedure: LAPAROSCOPIC CHOLECYSTECTOMY;  Surgeon: [name], MD;  Location: [facility];  Service: General;   Laterality: N/A;   LESION REMOVAL  09/2015   tubular adenoma-4 subcentimeter lesions   LIVER BIOPSY  02/19/2018   Procedure: LIVER BIOPSY;  Surgeon: [name], MD;  Location: [facility];  Service: General;;   right thumb surgery     SKIN CANCER EXCISION Right    Arm   TONSILLECTOMY     TOTAL KNEE ARTHROPLASTY Right 58/08/9832   UMBILICAL HERNIA REPAIR  02/19/2018   Procedure: REPAIR OF INCARCERATED UMBILICAL HERNIA;  Surgeon: [name], MD;  Location: [facility];  Service: General;;   Social History:   reports that she has never smoked. She has never used smokeless tobacco. She reports that she does not drink alcohol and does not use drugs.  Family History  Problem Relation Age of Onset   Breast cancer Mother    Colon cancer Neg Hx    Stomach cancer Neg Hx    Rectal cancer Neg Hx    Liver cancer Neg Hx    Esophageal cancer Neg Hx     Medications: Patient's Medications  New Prescriptions   No medications on file  Previous Medications   CHOLECALCIFEROL (VITAMIN D3) 2000 UNITS TABS    Take 1 tablet by mouth every morning.   FUROSEMIDE (LASIX) 20 MG TABLET    Take 0.5 tablets (10 mg total) by mouth daily.   GABAPENTIN (NEURONTIN) 300 MG CAPSULE    Take 1 capsule (300 mg total) by mouth 2 (two) times daily.   IBUPROFEN (ADVIL) 200 MG TABLET    Take 200 mg by mouth as needed.   KETOCONAZOLE (NIZORAL) 2 % SHAMPOO    APPLY TO AFFECTED AREA(S) 2 TIMES PER WEEK   LACTULOSE (CHRONULAC) 10 GM/15ML SOLUTION    Take 15 mLs (10 g total) by mouth daily.   LEVOTHYROXINE (SYNTHROID) 100 MCG TABLET    TAKE ONE TABLET BY MOUTH DAILY   MULTIPLE VITAMIN (MULTIVITAMIN) TABLET    Take 1 tablet by mouth daily.   PROPRANOLOL (INDERAL) 10 MG TABLET    Take 1 tablet (10 mg total) by mouth 2 (two) times daily.   SPIRONOLACTONE (ALDACTONE) 25 MG TABLET    TAKE 1 TABLET BY MOUTH DAILY   VENLAFAXINE XR (EFFEXOR-XR) 75 MG 24 HR CAPSULE    TAKE 1 CAPSULE BY MOUTH EVERY MORNING WITH BREAKFAST   VITAMIN B-12  (CYANOCOBALAMIN) 1000 MCG TABLET    Take 1,000 mcg by mouth every morning.  Modified Medications   No medications on file  Discontinued Medications   No medications on file    Physical Exam:  Vitals:   12/20/22 1439  BP: 124/72  Pulse: 64  Temp: (!) 97.1 F (36.2 C)  TempSrc: Temporal  SpO2: 96%  Weight: 196 lb (88.9 kg)  Height: 4' 10"  (1.473 m)   Body mass index is 40.96  kg/m. Wt Readings from Last 3 Encounters:  12/20/22 196 lb (88.9 kg)  10/24/22 202 lb 12.8 oz (92 kg)  08/31/22 198 lb (89.8 kg)    Physical Exam Constitutional:      General: She is not in acute distress.    Appearance: She is well-developed. She is not diaphoretic.  HENT:     Head: Normocephalic and atraumatic.     Mouth/Throat:     Pharynx: No oropharyngeal exudate.  Eyes:     Conjunctiva/sclera: Conjunctivae normal.     Pupils: Pupils are equal, round, and reactive to light.  Cardiovascular:     Rate and Rhythm: Normal rate and regular rhythm.     Heart sounds: Normal heart sounds.  Pulmonary:     Effort: Pulmonary effort is normal.     Breath sounds: Normal breath sounds.  Abdominal:     General: Bowel sounds are normal.     Palpations: Abdomen is soft.  Musculoskeletal:     Cervical back: Normal range of motion and neck supple.     Right lower leg: No edema.     Left lower leg: No edema.  Skin:    General: Skin is warm and dry.  Neurological:     Mental Status: She is alert and oriented to person, place, and time.  Psychiatric:        Mood and Affect: Mood normal.     Labs reviewed: Basic Metabolic Panel: Recent Labs    04/02/22 0917 10/24/22 1207  NA 139 137  K 3.9 4.0  CL 102 98  CO2 30 32  GLUCOSE 91 90  BUN 14 15  CREATININE 0.73 0.70  CALCIUM 9.3 9.4  TSH 3.96  --    Liver Function Tests: Recent Labs    04/02/22 0917 10/24/22 1207  AST 19 21  ALT 12 13  ALKPHOS  --  54  BILITOT 0.6 0.7  PROT 7.1 7.6  ALBUMIN  --  3.8   No results for input(s):  "LIPASE", "AMYLASE" in the last 8760 hours. No results for input(s): "AMMONIA" in the last 8760 hours. CBC: Recent Labs    04/02/22 0917 10/24/22 1207  WBC 7.1 8.5  NEUTROABS 4,367 4.9  HGB 12.0 11.8*  HCT 36.2 35.0*  MCV 93.1 92.8  PLT 194 224.0   Lipid Panel: Recent Labs    04/02/22 0917  CHOL 187  HDL 67  LDLCALC 100*  TRIG 102  CHOLHDL 2.8   TSH: Recent Labs    04/02/22 0917  TSH 3.96   A1C: No results found for: "HGBA1C"   Assessment/Plan 1. Postoperative hypothyroidism -continues on synthroid 100 mcg daily, follow up TSH with next labs  2. Depression, major, recurrent, moderate ([place]) -worse at this time, discussed meeting with a therapist, information given. She is willing to try this.  -will continue effexor at this time but may need to try another medication if depression doesn't improve with therapy.  3. Waldenstrom's macroglobulinemia ([place]) -monitored by GI  4. Hepatic encephalopathy (H[facility]) Stable, has upcoming [place] ordered  5. Need for influenza vaccination - Flu Vaccine QUAD High Dose(Fluad)  6. Idiopathic progressive neuropathy -ongoing, continues on gabapentin   Return in about 4 months (around 04/22/2023) for routine follow up . Carlos American. [place], [facility] [phone]

## 2022-12-21 NOTE — Patient Instructions (Addendum)
[facility]  [address] [place], [place] [address] Phone: [place] [address] [place], [place] [address] Phone: [phone]  [facility] [address] [place], [place] [address] Phone [phone]  [facility] [place], [place], [place], [place] [address] [place] [address] [place], [place] [address] [phone] In home visit available with prior arrangement   Call [facility] imaging to schedule bone density- [phone]

## 2023-01-08 ENCOUNTER — Other Ambulatory Visit: Payer: Self-pay | Admitting: Gastroenterology

## 2023-02-09 ENCOUNTER — Telehealth: Payer: Self-pay | Admitting: Adult Health

## 2023-02-09 ENCOUNTER — Encounter: Payer: Self-pay | Admitting: Adult Health

## 2023-02-09 DIAGNOSIS — U071 COVID-19: Secondary | ICD-10-CM

## 2023-02-09 MED ORDER — NIRMATRELVIR/RITONAVIR (PAXLOVID)TABLET: 3.0000 | ORAL_TABLET | Freq: Two times a day (BID) | ORAL | 0 refills | Status: AC

## 2023-02-09 NOTE — Telephone Encounter (Signed)
Pt reports cough, runny nose, sore throat symptoms present for two days. Did at home covid test and its positive. Pt at risk for severe disease. Does have hepatic impairment but normal LFTs. GFR ok. Prescription sent.

## 2023-02-18 ENCOUNTER — Encounter: Payer: Self-pay | Admitting: Orthopedic Surgery

## 2023-02-18 ENCOUNTER — Telehealth (INDEPENDENT_AMBULATORY_CARE_PROVIDER_SITE_OTHER): Payer: Medicare Other | Admitting: Orthopedic Surgery

## 2023-02-18 DIAGNOSIS — J40 Bronchitis, not specified as acute or chronic: Secondary | ICD-10-CM | POA: Diagnosis not present

## 2023-02-18 DIAGNOSIS — U071 COVID-19: Secondary | ICD-10-CM | POA: Diagnosis not present

## 2023-02-18 DIAGNOSIS — R0981 Nasal congestion: Secondary | ICD-10-CM | POA: Diagnosis not present

## 2023-02-18 MED ORDER — PREDNISONE 10 MG PO TABS: ORAL_TABLET | ORAL | 0 refills | Status: AC

## 2023-02-18 NOTE — Patient Instructions (Signed)
Please take prednisone in the morning  Try Zyrtec or Claritin- one tablet at night x 14 days> helps dry nasal secretions  May also try saline nasal spray to help with nasal secretions   Contact [facility] if symptoms worsen or do not resolve

## 2023-02-18 NOTE — Progress Notes (Signed)
   This service is provided via telemedicine  No vital signs collected/recorded due to the encounter was a telemedicine visit.   Location of patient (ex: home, work):  Home  Patient consents to a telephone visit:  Yes  Location of the provider (ex: office, home):  [facility]  Name of any referring provider:  [name], NP   Names of all persons participating in the telemedicine service and their role in the encounter:  Patient, Heriberto Ant[place], [place], [name], NP.    Time spent on call:  8 minutes spent on the phone with Medical Assistant.

## 2023-02-18 NOTE — Progress Notes (Signed)
Careteam: Patient Care Team: Christina Chandler, NP as PCP - General (Geriatric Medicine) Armbruster, Christina Raspberry, MD as Consulting Physician (Gastroenterology) Christina Boston, MD as Consulting Physician (General Surgery)  Seen by: Christina [name], AGNP-C  PLACE OF SERVICE:  [facility] Advanced Directive information Does Patient Have a Medical Advance Directive?: Yes, Type of Advance Directive: Out of facility DNR (pink MOST or yellow form);[name];Living will, Does patient want to make changes to medical advance directive?: No - Patient declined  No Known Allergies  Chief Complaint  Patient presents with   Acute Visit    Patient complains of testing Positive for Covid-19 8 days ago. Patient states she's fatigued, coughing, and having runny nose. Patient states she's still testing positive.     HPI: Patient is a 77 y.o. female seen today for acute visit via virtual visit due to recent covid.   02/17 she tested positive for covid. She was prescribed paxloid> completed. She continues to have clear nasal congestion, dry cough and fatigue. She feels tired because cough is keeping her up at night. She retested herself for covid and was still positive. She has not tried any OTC remedies for symptoms. Afebrile. Denies chest pain, sob, fever, sore throat, and ear pain.   Review of Systems:  Review of Systems  Constitutional:  Positive for malaise/fatigue. Negative for chills and fever.  HENT:  Positive for congestion. Negative for ear pain, sinus pain and sore throat.   Eyes:  Negative for blurred vision and double vision.  Respiratory:  Positive for cough. Negative for sputum production, shortness of breath and wheezing.   Cardiovascular:  Negative for chest pain and leg swelling.  Gastrointestinal:  Negative for diarrhea, nausea and vomiting.  Musculoskeletal:  Negative for myalgias.  Neurological:  Negative for dizziness and headaches.   Psychiatric/Behavioral:  Negative for depression. The patient is not nervous/anxious.     Past Medical History:  Diagnosis Date   Abnormal EKG    Abnormal finding on thyroid function test    Abnormal liver function test    Acute edema    Alcoholic cirrhosis ([place]) 123456   Possible NASH overlap (MELD 13)   Anemia    Anxiety    Arthritis    Ascites    Back pain    Breast mass    Chronic low back pain    Complete right rotator cuff tear    Complication of anesthesia    during first surgery- woke up during surgery many years ago   Compression fracture of L4 lumbar vertebra    Dermatitis, eczematoid    Difficulty breathing    Gallstones    History of adenocarcinoma of breast    Hypercholesterolemia    Hyperkalemia    Hypertension    Hypokalemia    Hypothyroidism    Insomnia    Knee pain    Leukocytosis    Lymphadenopathy    Macrocytosis    Memory loss or impairment    Menopause    MGUS (monoclonal gammopathy of unknown significance)    Osteoarthritis    Osteoarthritis of right knee    Other cirrhosis of liver (HCC)    Renal insufficiency syndrome    Right knee DJD    Solitary thyroid nodule    Unspecified lump in the left breast, unspecified quadrant    Vitamin B 12 deficiency    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HCC)    Past Surgical History:  Procedure Laterality Date   BREAST SURGERY  2014   Removed lymphnodes   EYE SURGERY  2006   bilateral cataract   JOINT REPLACEMENT     right  (2017)and left knee   LAPAROSCOPIC CHOLECYSTECTOMY SINGLE SITE WITH INTRAOPERATIVE CHOLANGIOGRAM N/A 02/19/2018   Procedure: LAPAROSCOPIC CHOLECYSTECTOMY;  Surgeon: Christina Boston, MD;  Location: [facility];  Service: General;  Laterality: N/A;   LESION REMOVAL  09/2015   tubular adenoma-4 subcentimeter lesions   LIVER BIOPSY  02/19/2018   Procedure: LIVER BIOPSY;  Surgeon: Christina Boston, MD;  Location: [facility];  Service: General;;   right thumb surgery     SKIN CANCER EXCISION  Right    Arm   TONSILLECTOMY     TOTAL KNEE ARTHROPLASTY Right [id number]   UMBILICAL HERNIA REPAIR  02/19/2018   Procedure: REPAIR OF INCARCERATED UMBILICAL HERNIA;  Surgeon: Christina Boston, MD;  Location: [facility];  Service: General;;   Social History:   reports that she has never smoked. She has never used smokeless tobacco. She reports that she does not drink alcohol and does not use drugs.  Family History  Problem Relation Age of Onset   Breast cancer Mother    Colon cancer Neg Hx    Stomach cancer Neg Hx    Rectal cancer Neg Hx    Liver cancer Neg Hx    Esophageal cancer Neg Hx     Medications: Patient's Medications  New Prescriptions   No medications on file  Previous Medications   CHOLECALCIFEROL (VITAMIN D3) 2000 UNITS TABS    Take 1 tablet by mouth every morning.   FUROSEMIDE (LASIX) 20 MG TABLET    Take 0.5 tablets (10 mg total) by mouth daily.   GABAPENTIN (NEURONTIN) 300 MG CAPSULE    Take 1 capsule (300 mg total) by mouth 2 (two) times daily.   IBUPROFEN (ADVIL) 200 MG TABLET    Take 200 mg by mouth as needed.   KETOCONAZOLE (NIZORAL) 2 % SHAMPOO    APPLY TO AFFECTED AREA(S) 2 TIMES PER WEEK   LACTULOSE (CHRONULAC) 10 GM/15ML SOLUTION    TAKE 15 ML'S  BY MOUTH DAILY   LEVOTHYROXINE (SYNTHROID) 100 MCG TABLET    TAKE ONE TABLET BY MOUTH DAILY   MULTIPLE VITAMIN (MULTIVITAMIN) TABLET    Take 1 tablet by mouth daily.   PROPRANOLOL (INDERAL) 10 MG TABLET    Take 1 tablet (10 mg total) by mouth 2 (two) times daily.   SPIRONOLACTONE (ALDACTONE) 25 MG TABLET    TAKE 1 TABLET BY MOUTH DAILY   VENLAFAXINE XR (EFFEXOR-XR) 75 MG 24 HR CAPSULE    TAKE 1 CAPSULE BY MOUTH EVERY MORNING WITH BREAKFAST   VITAMIN B-12 (CYANOCOBALAMIN) 1000 MCG TABLET    Take 1,000 mcg by mouth every morning.  Modified Medications   No medications on file  Discontinued Medications   No medications on file    Physical Exam:  There were no vitals filed for this visit. There is no height or weight  on file to calculate BMI. Wt Readings from Last 3 Encounters:  12/20/22 196 lb (88.9 kg)  10/24/22 202 lb 12.8 oz (92 kg)  08/31/22 198 lb (89.8 kg)    Physical Exam Vitals (Exam limited due to virtual visit) reviewed.  Constitutional:      General: She is not in acute distress. HENT:     Head: Normocephalic.  Neurological:     Mental Status: She is alert.     Labs reviewed: Basic Metabolic Panel: Recent Labs    04/02/22  [id number] 10/24/22 1207  NA 139 137  K 3.9 4.0  CL 102 98  CO2 30 32  GLUCOSE 91 90  BUN 14 15  CREATININE 0.73 0.70  CALCIUM 9.3 9.4  TSH 3.96  --    Liver Function Tests: Recent Labs    04/02/22 0917 10/24/22 1207  AST 19 21  ALT 12 13  ALKPHOS  --  54  BILITOT 0.6 0.7  PROT 7.1 7.6  ALBUMIN  --  3.8   No results for input(s): "LIPASE", "AMYLASE" in the last 8760 hours. No results for input(s): "AMMONIA" in the last 8760 hours. CBC: Recent Labs    04/02/22 0917 10/24/22 1207  WBC 7.1 8.5  NEUTROABS 4,367 4.9  HGB 12.0 11.8*  HCT 36.2 35.0*  MCV 93.1 92.8  PLT 194 224.0   Lipid Panel: Recent Labs    04/02/22 0917  CHOL 187  HDL 67  LDLCALC 100*  TRIG 102  CHOLHDL 2.8   TSH: Recent Labs    04/02/22 0917  TSH 3.96   A1C: No results found for: "HGBA1C"   Assessment/Plan 1. Bronchitis - 02/17 tested positive for covid> completed paxlovid - continues to have dry cough, keeping her up at night - ? Rebound bronchitis vs rebound paxlovid - start Prednisone taper - advised to contact PCP if symptoms worsen or do not resolve> will need in person visit  2. Nasal congestion - see aboove - advised to try Zyrtec or Claritin qhs x 14 days - may also use saline nasal spray prn  3. COVID-19 virus infection - see above  Virtual Visit   I connected with Christina [name] and verified that I am speaking with the correct person using two identifiers.  Location: [facility] Patient: Christina [name] Provider:Mulan Adan Adria Dill, NP    I discussed the limitations, risks, security and privacy concerns of performing an evaluation and management service by telephone and the availability of in person appointments. I also discussed with the patient that there may be a patient responsible charge related to this service. The patient expressed understanding and agreed to proceed.   I discussed the assessment and treatment plan with the patient. The patient was provided an opportunity to ask questions and all were answered. The patient agreed with the plan and demonstrated an understanding of the instructions.   The patient was advised to call back or seek an in-person evaluation if the symptoms worsen or if the condition fails to improve as anticipated.  I provided 11 minutes of face-to-face time during this encounter.  Tyron Manetta Cleophas Dunker, [place] printed and mailed    Next appt: none Parnell Spieler [place], [facility] [phone]

## 2023-03-28 ENCOUNTER — Ambulatory Visit: Payer: Medicare Other | Admitting: Podiatry

## 2023-03-28 DIAGNOSIS — M79674 Pain in right toe(s): Secondary | ICD-10-CM | POA: Diagnosis not present

## 2023-03-28 DIAGNOSIS — L84 Corns and callosities: Secondary | ICD-10-CM | POA: Diagnosis not present

## 2023-03-28 DIAGNOSIS — B351 Tinea unguium: Secondary | ICD-10-CM

## 2023-03-28 NOTE — Patient Instructions (Signed)

## 2023-03-28 NOTE — Progress Notes (Signed)
Subjective:   Patient ID: Christina [name], female   DOB: 77 y.o.   MRN: [id number]   HPI Patient presents with several problems with partial detachment of the right hallux nail with thickness of the bed and on the left is noted to have severe lesions of the left hallux painful when pressed.  Patient does not smoke likes to be active   Review of Systems  All other systems reviewed and are negative.       Objective:  Physical Exam Vitals and nursing note reviewed.  Constitutional:      Appearance: She is well-developed.  Pulmonary:     Effort: Pulmonary effort is normal.  Musculoskeletal:        General: Normal range of motion.  Skin:    General: Skin is warm.  Neurological:     Mental Status: She is alert.     Neurovascular status was found to be intact muscle strength found to be adequate range of motion adequate heel pain in the past is resolved currently with patient found to have severe keratotic lesion subleft hallux very painful and a damaged right hallux nail bed partially detached thick and painful.  Good digital perfusion well-oriented     Assessment:  Chronic keratotic lesion subthird metatarsal left with pain and partially detached traumatized nailbed right hallux     Plan:  H&P reviewed both conditions and sterile removal debridement of the nailbed right accomplished debridement lesion left courtesy advised on wider shoes discussed offloading and reappoint as symptoms indicate

## 2023-04-04 ENCOUNTER — Other Ambulatory Visit: Payer: Self-pay | Admitting: Gastroenterology

## 2023-04-04 DIAGNOSIS — I8510 Secondary esophageal varices without bleeding: Secondary | ICD-10-CM

## 2023-04-08 IMAGING — US US ABDOMEN LIMITED
1 series · 14 of 25 positions shown · non-contrast
Comparison: Ultrasound dated 03/17/2021.

CLINICAL DATA: Cirrhosis.  HCC screening.

EXAM:
ULTRASOUND ABDOMEN LIMITED RIGHT UPPER QUADRANT

[Series 1: us abdomen limited · 14 of 27 slices shown]
[im 1/27]
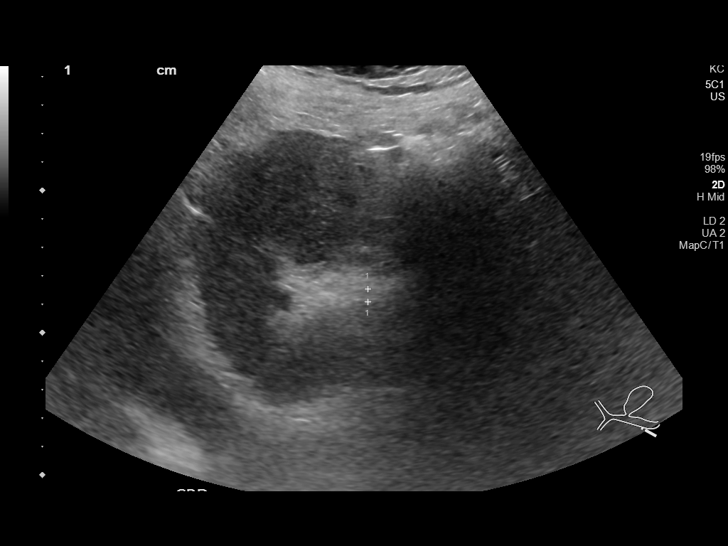
[im 3/27]
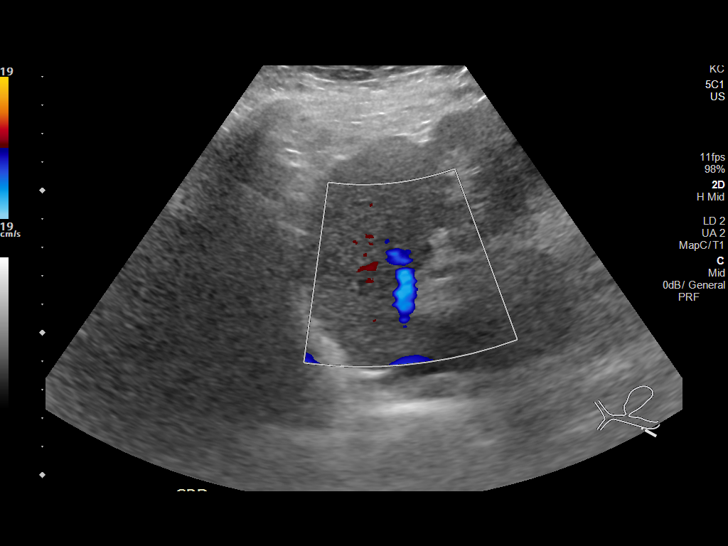
[im 5/27]
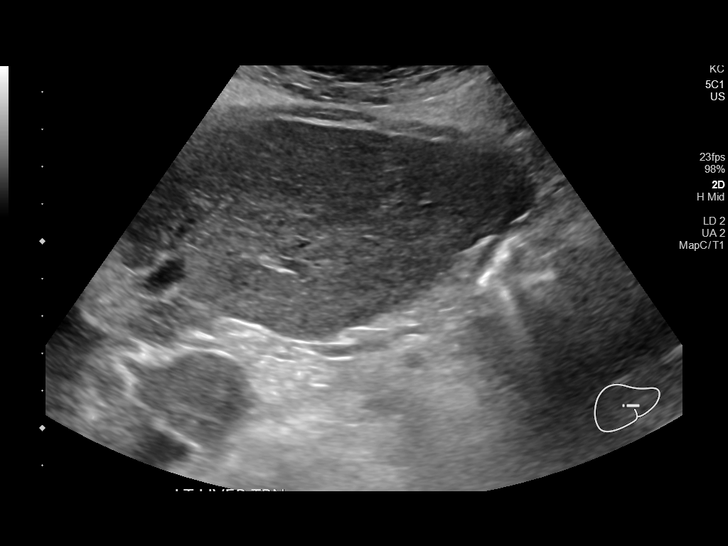
[im 7/27]
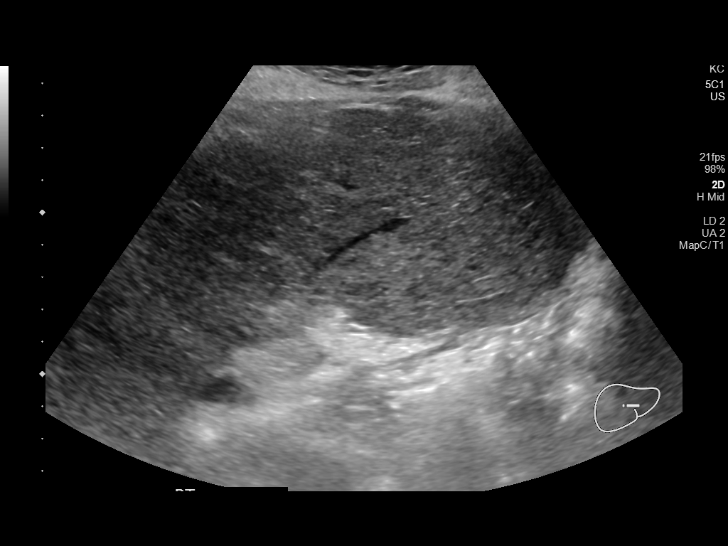
[im 9/27]
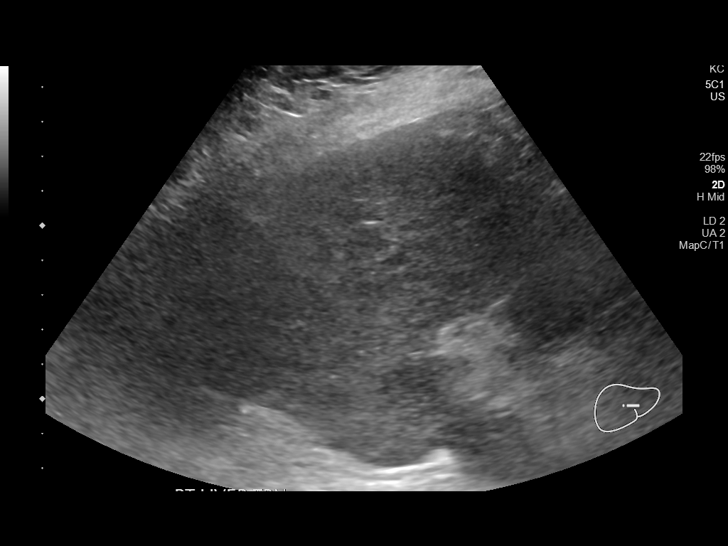
[im 10/27]
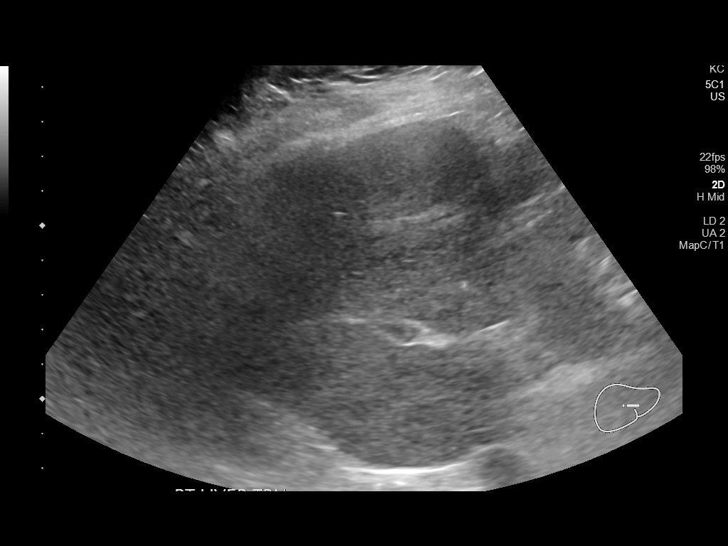
[im 12/27]
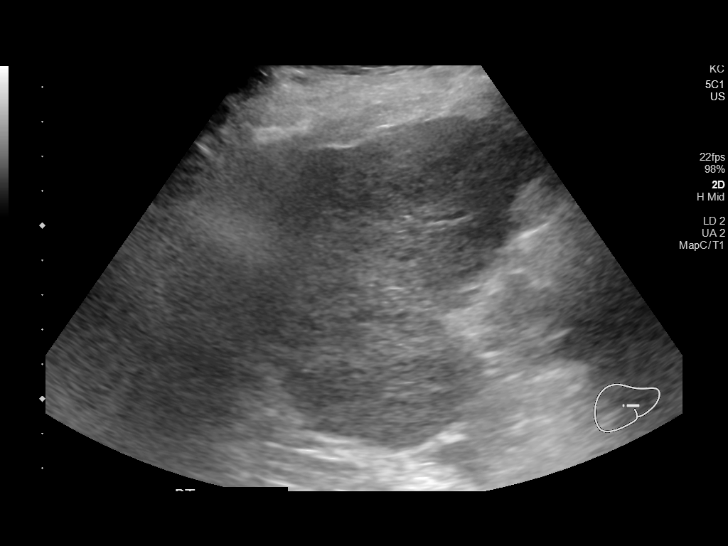
[im 15/27]
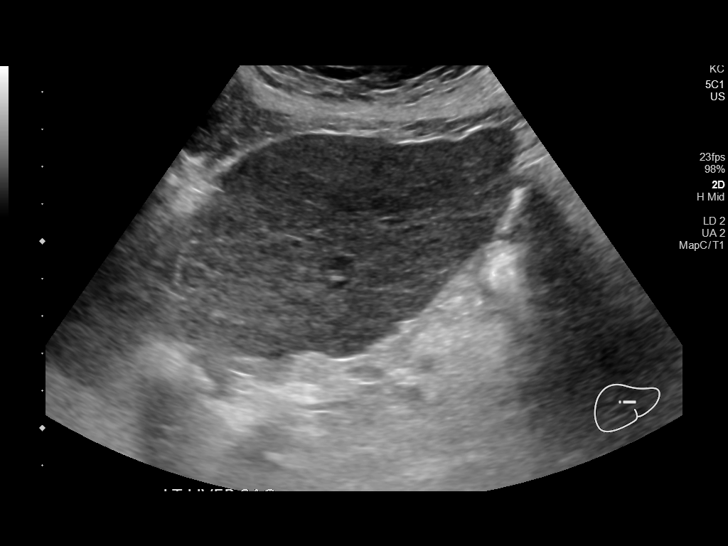
[im 17/27]
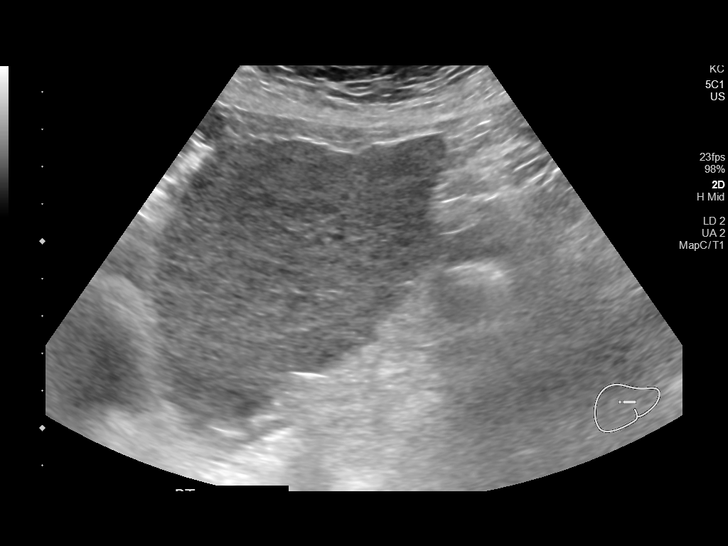
[im 18/27]
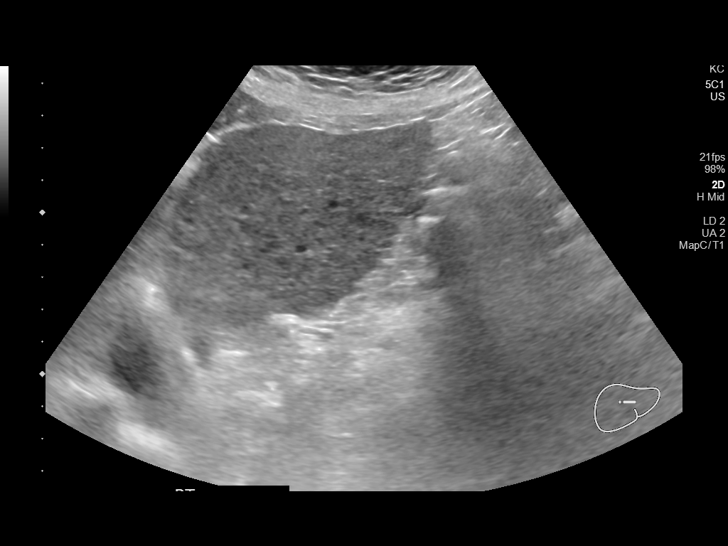
[im 20/27]
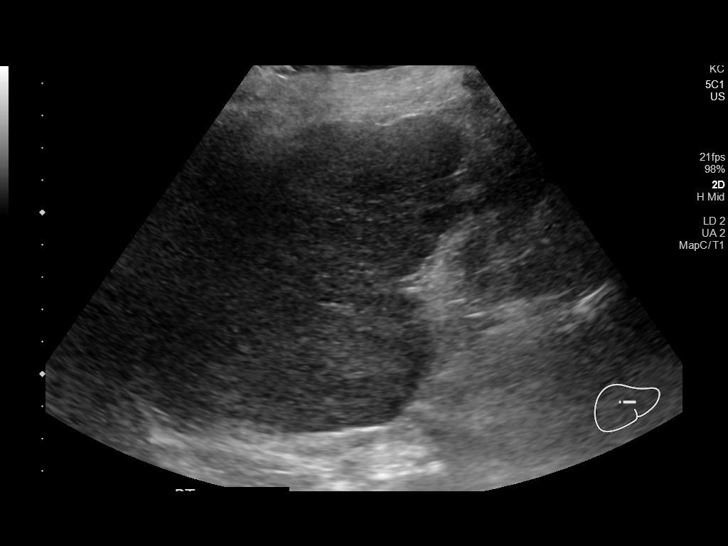
[im 22/27]
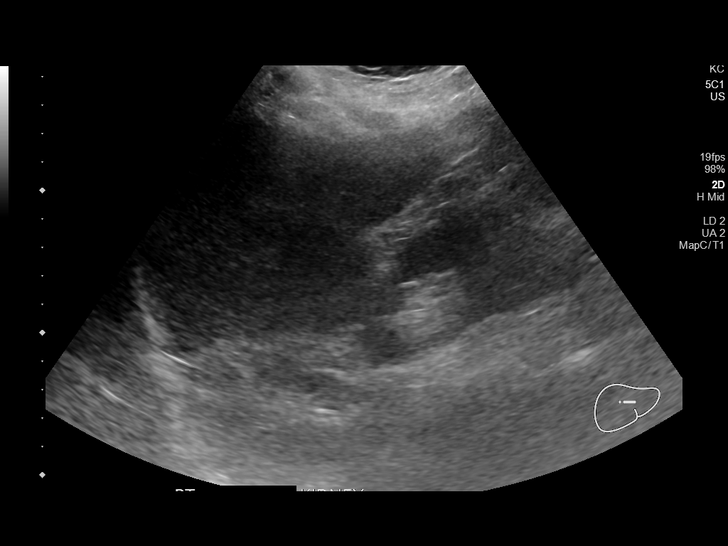
[im 24/27]
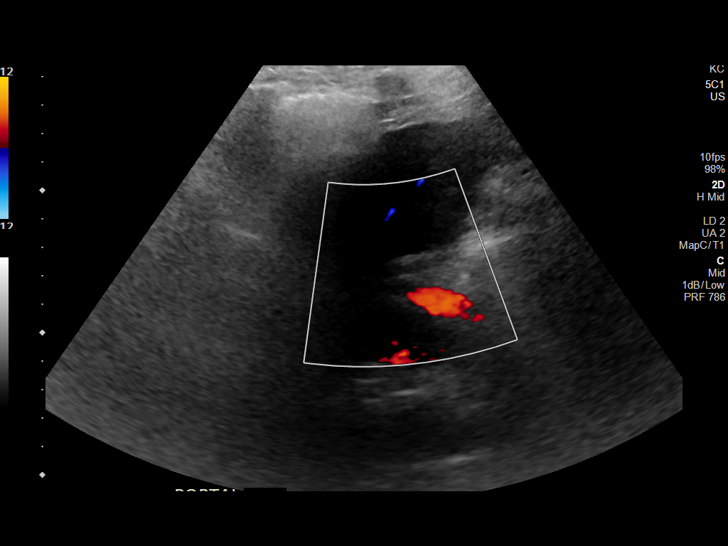
[im 27/27]
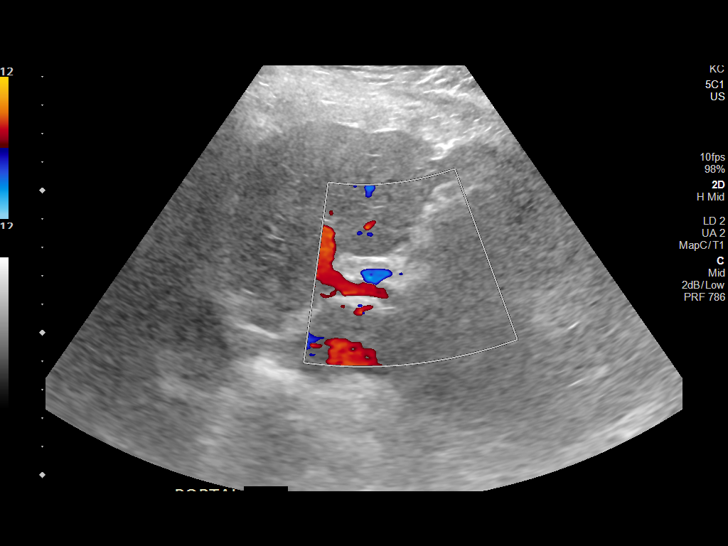

[14 of 25 positions shown; findings below may reference images not displayed]

FINDINGS: Gallbladder:

Cholecystectomy.

Common bile duct:

Diameter: 5 mm

Liver:

Morphologic changes of cirrhosis. No discrete mass. Portal vein is
patent on color Doppler imaging with normal direction of blood flow
towards the liver.

Other: None.
IMPRESSION: 1. Cirrhosis.  No discrete mass.
2. Patent main portal vein with hepatopetal flow.
3. No ascites.
4. Cholecystectomy.

## 2023-04-22 ENCOUNTER — Ambulatory Visit (INDEPENDENT_AMBULATORY_CARE_PROVIDER_SITE_OTHER): Payer: Medicare Other | Admitting: Nurse Practitioner

## 2023-04-22 ENCOUNTER — Encounter: Payer: Self-pay | Admitting: Nurse Practitioner

## 2023-04-22 VITALS — BP 120/84 | HR 70 | Temp 97.0°F | Ht <= 58 in | Wt 194.4 lb

## 2023-04-22 DIAGNOSIS — I700 Atherosclerosis of aorta: Secondary | ICD-10-CM | POA: Diagnosis not present

## 2023-04-22 DIAGNOSIS — G603 Idiopathic progressive neuropathy: Secondary | ICD-10-CM

## 2023-04-22 DIAGNOSIS — E785 Hyperlipidemia, unspecified: Secondary | ICD-10-CM

## 2023-04-22 DIAGNOSIS — E890 Postprocedural hypothyroidism: Secondary | ICD-10-CM

## 2023-04-22 DIAGNOSIS — F331 Major depressive disorder, recurrent, moderate: Secondary | ICD-10-CM | POA: Diagnosis not present

## 2023-04-22 DIAGNOSIS — M542 Cervicalgia: Secondary | ICD-10-CM | POA: Diagnosis not present

## 2023-04-22 DIAGNOSIS — R0602 Shortness of breath: Secondary | ICD-10-CM | POA: Diagnosis not present

## 2023-04-22 DIAGNOSIS — Z6841 Body Mass Index (BMI) 40.0 and over, adult: Secondary | ICD-10-CM

## 2023-04-22 NOTE — Progress Notes (Signed)
Careteam: Patient Care Team: Sharon Seller, NP as PCP - General (Geriatric Medicine) Armbruster, Willaim Rayas, MD as Consulting Physician (Gastroenterology) Karie Soda, MD as Consulting Physician (General Surgery)  PLACE OF SERVICE:  [facility]  Advanced Directive information    No Known Allergies  Chief Complaint  Patient presents with   Medical Management of Chronic Issues    4 month follow-up. Discuss need for mammogram and additional covid boosters or post pone if patient refuses or is not a candidate. AWV is pending for June 2024.      HPI: Patient is a 77 y.o. female for routine follow up. Reports she did not find a therapist but feels like she is holding her own.  Sews for joy  Reports she had COVID a month ago and still finding her self very fatigue. She is having more shortness of breath without activity and with activities.  No chest pains or palpitations.  No dark stools or blood in stools No abdominal pain.   Neuropathy- ongoing, hands/nerves hurt. Left hand has gotten worse.  Having neck pain, always there, sometimes worse, being nerves or uptight.  No recent imaging.   Eats 1-2 meals a day. No energy.  Review of Systems:  Review of Systems  Constitutional:  Positive for malaise/fatigue. Negative for chills, fever and weight loss.  HENT:  Negative for tinnitus.   Respiratory:  Negative for cough, sputum production and shortness of breath.   Cardiovascular:  Negative for chest pain, palpitations and leg swelling.  Gastrointestinal:  Negative for abdominal pain, constipation, diarrhea and heartburn.  Genitourinary:  Negative for dysuria, frequency and urgency.  Musculoskeletal:  Negative for back pain, falls, joint pain and myalgias.  Skin: Negative.   Neurological:  Negative for dizziness and headaches.  Psychiatric/Behavioral:  Negative for depression and memory loss. The patient does not have insomnia.     Past Medical History:  Diagnosis Date    Abnormal EKG    Abnormal finding on thyroid function test    Abnormal liver function test    Acute edema    Alcoholic cirrhosis (HCC) 07/02/2016   Possible NASH overlap (MELD 13)   Anemia    Anxiety    Arthritis    Ascites    Back pain    Breast mass    Chronic low back pain    Complete right rotator cuff tear    Complication of anesthesia    during first surgery- woke up during surgery many years ago   Compression fracture of L4 lumbar vertebra    Dermatitis, eczematoid    Difficulty breathing    Gallstones    History of adenocarcinoma of breast    Hypercholesterolemia    Hyperkalemia    Hypertension    Hypokalemia    Hypothyroidism    Insomnia    Knee pain    Leukocytosis    Lymphadenopathy    Macrocytosis    Memory loss or impairment    Menopause    MGUS (monoclonal gammopathy of unknown significance)    Osteoarthritis    Osteoarthritis of right knee    Other cirrhosis of liver (HCC)    Renal insufficiency syndrome    Right knee DJD    Solitary thyroid nodule    Unspecified lump in the left breast, unspecified quadrant    Vitamin B 12 deficiency    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HCC)    Past Surgical History:  Procedure Laterality Date   BREAST SURGERY  2014   Removed  lymphnodes   EYE SURGERY  2006   bilateral cataract   JOINT REPLACEMENT     right  (2017)and left knee   LAPAROSCOPIC CHOLECYSTECTOMY SINGLE SITE WITH INTRAOPERATIVE CHOLANGIOGRAM N/A 02/19/2018   Procedure: LAPAROSCOPIC CHOLECYSTECTOMY;  Surgeon: Karie Soda, MD;  Location: [facility];  Service: General;  Laterality: N/A;   LESION REMOVAL  09/2015   tubular adenoma-4 subcentimeter lesions   LIVER BIOPSY  02/19/2018   Procedure: LIVER BIOPSY;  Surgeon: Karie Soda, MD;  Location: [facility];  Service: General;;   right thumb surgery     SKIN CANCER EXCISION Right    Arm   TONSILLECTOMY     TOTAL KNEE ARTHROPLASTY Right 09/05/2016   UMBILICAL HERNIA REPAIR  02/19/2018   Procedure: REPAIR  OF INCARCERATED UMBILICAL HERNIA;  Surgeon: Karie Soda, MD;  Location: [facility];  Service: General;;   Social History:   reports that she has never smoked. She has never used smokeless tobacco. She reports that she does not drink alcohol and does not use drugs.  Family History  Problem Relation Age of Onset   Breast cancer Mother    Colon cancer Neg Hx    Stomach cancer Neg Hx    Rectal cancer Neg Hx    Liver cancer Neg Hx    Esophageal cancer Neg Hx     Medications: Patient's Medications  New Prescriptions   No medications on file  Previous Medications   CHOLECALCIFEROL (VITAMIN D3) 2000 UNITS TABS    Take 1 tablet by mouth every morning.   FUROSEMIDE (LASIX) 20 MG TABLET    Take 0.5 tablets (10 mg total) by mouth daily.   GABAPENTIN (NEURONTIN) 300 MG CAPSULE    Take 1 capsule (300 mg total) by mouth 2 (two) times daily.   IBUPROFEN (ADVIL) 200 MG TABLET    Take 200 mg by mouth as needed.   KETOCONAZOLE (NIZORAL) 2 % SHAMPOO    APPLY TO AFFECTED AREA(S) 2 TIMES PER WEEK   LACTULOSE (CHRONULAC) 10 GM/15ML SOLUTION    TAKE 15 ML'S  BY MOUTH DAILY   LEVOTHYROXINE (SYNTHROID) 100 MCG TABLET    TAKE ONE TABLET BY MOUTH DAILY   MULTIPLE VITAMIN (MULTIVITAMIN) TABLET    Take 1 tablet by mouth daily.   PROPRANOLOL (INDERAL) 10 MG TABLET    Take 1 tablet (10 mg total) by mouth 2 (two) times daily. Please call to schedule an office visit with Dr. Adela Lank:  [phone]. Thank you   SPIRONOLACTONE (ALDACTONE) 25 MG TABLET    TAKE 1 TABLET BY MOUTH DAILY   VENLAFAXINE XR (EFFEXOR-XR) 75 MG 24 HR CAPSULE    TAKE 1 CAPSULE BY MOUTH EVERY MORNING WITH BREAKFAST   VITAMIN B-12 (CYANOCOBALAMIN) 1000 MCG TABLET    Take 1,000 mcg by mouth every morning.  Modified Medications   No medications on file  Discontinued Medications   No medications on file    Physical Exam:  Vitals:   04/22/23 1036  BP: 120/84  Pulse: 70  Temp: (!) 97 F (36.1 C)  SpO2: 98%  Weight: 194 lb 6.4 oz (88.2 kg)   Height: 4\' 10"  (1.473 m)   Body mass index is 40.63 kg/m. Wt Readings from Last 3 Encounters:  04/22/23 194 lb 6.4 oz (88.2 kg)  12/20/22 196 lb (88.9 kg)  10/24/22 202 lb 12.8 oz (92 kg)    Physical Exam Constitutional:      General: She is not in acute distress.    Appearance: She is  well-developed. She is not diaphoretic.  HENT:     Head: Normocephalic and atraumatic.     Mouth/Throat:     Pharynx: No oropharyngeal exudate.  Eyes:     Conjunctiva/sclera: Conjunctivae normal.     Pupils: Pupils are equal, round, and reactive to light.  Cardiovascular:     Rate and Rhythm: Normal rate and regular rhythm.     Heart sounds: Normal heart sounds.  Pulmonary:     Effort: Pulmonary effort is normal.     Breath sounds: Normal breath sounds.  Abdominal:     General: Bowel sounds are normal.     Palpations: Abdomen is soft.  Musculoskeletal:     Cervical back: Normal range of motion and neck supple.     Right lower leg: No edema.     Left lower leg: No edema.  Skin:    General: Skin is warm and dry.  Neurological:     Mental Status: She is alert.  Psychiatric:        Mood and Affect: Mood normal.     Labs reviewed: Basic Metabolic Panel: Recent Labs    10/24/22 1207  NA 137  K 4.0  CL 98  CO2 32  GLUCOSE 90  BUN 15  CREATININE 0.70  CALCIUM 9.4   Liver Function Tests: Recent Labs    10/24/22 1207  AST 21  ALT 13  ALKPHOS 54  BILITOT 0.7  PROT 7.6  ALBUMIN 3.8   No results for input(s): "LIPASE", "AMYLASE" in the last 8760 hours. No results for input(s): "AMMONIA" in the last 8760 hours. CBC: Recent Labs    10/24/22 1207  WBC 8.5  NEUTROABS 4.9  HGB 11.8*  HCT 35.0*  MCV 92.8  PLT 224.0   Lipid Panel: No results for input(s): "CHOL", "HDL", "LDLCALC", "TRIG", "CHOLHDL", "LDLDIRECT" in the last 8760 hours. TSH: No results for input(s): "TSH" in the last 8760 hours. A1C: No results found for: "HGBA1C"   Assessment/Plan 1. Postoperative  hypothyroidism Will follow up labs to make sure TSH at goal Continues on synthroid 100 mcg daily - TSH  2. Depression, major, recurrent, moderate (HCC) Ongoing/stable. Discussed psych for ongoing management but she feels like she is doing okay without making appt.  Encouraged to do so if symptoms worsen.  Continues on effexor   3. Aortic atherosclerosis (HCC) -noted on imaging - Lipid panel  4. Idiopathic progressive neuropathy Continues on gabapentin - CBC with Differential/Platelet  5. Class 3 severe obesity due to excess calories without serious comorbidity with body mass index (BMI) of 40.0 to 44.9 in adult [facility]) --education provided on healthy weight loss through increase in physical activity and proper nutrition .  6. Neck pain Reports ongoing neck pain, limited ROM and numbness in left hand. Will get xray to further evaluate to see if this is contributing to hand pain - DG Cervical Spine Complete; Future  7. Hyperlipidemia, unspecified hyperlipidemia type Continues with dietary modifications  - Lipid panel - COMPLETE METABOLIC PANEL WITH GFR  8. Shortness of breath Reports shortness of breath with minimal activity She is sedentary which is likely contributing factor but will get imaging for further evalution  - DG Chest 2 View; Future  - EKG 12-Lead stable from previous Will get labs and chest xray at this time.    Return in about 4 months (around 08/22/2023) for routine follow up.  Janene Harvey. Biagio Borg [facility] [phone]

## 2023-04-23 LAB — COMPLETE METABOLIC PANEL WITHOUT GFR
AG Ratio: 1.1 (calc) (ref 1.0–2.5)
ALT: 12 U/L (ref 6–29)
AST: 19 U/L (ref 10–35)
Albumin: 3.7 g/dL (ref 3.6–5.1)
Alkaline phosphatase (APISO): 62 U/L (ref 37–153)
BUN: 20 mg/dL (ref 7–25)
CO2: 31 mmol/L (ref 20–32)
Calcium: 9.5 mg/dL (ref 8.6–10.4)
Chloride: 102 mmol/L (ref 98–110)
Creat: 0.88 mg/dL (ref 0.60–1.00)
Globulin: 3.4 g/dL (ref 1.9–3.7)
Glucose, Bld: 96 mg/dL (ref 65–99)
Potassium: 4 mmol/L (ref 3.5–5.3)
Sodium: 142 mmol/L (ref 135–146)
Total Bilirubin: 0.7 mg/dL (ref 0.2–1.2)
Total Protein: 7.1 g/dL (ref 6.1–8.1)
eGFR: 67 mL/min/{1.73_m2}

## 2023-04-23 LAB — CBC WITH DIFFERENTIAL/PLATELET
Absolute Monocytes: 683 cells/uL (ref 200–950)
Basophils Absolute: 41 cells/uL (ref 0–200)
Basophils Relative: 0.6 %
Eosinophils Absolute: 69 cells/uL (ref 15–500)
Eosinophils Relative: 1 %
HCT: 36 % (ref 35.0–45.0)
Hemoglobin: 12.2 g/dL (ref 11.7–15.5)
Lymphs Abs: 2015 cells/uL (ref 850–3900)
MCH: 30.9 pg (ref 27.0–33.0)
MCHC: 33.9 g/dL (ref 32.0–36.0)
MCV: 91.1 fL (ref 80.0–100.0)
MPV: 10 fL (ref 7.5–12.5)
Monocytes Relative: 9.9 %
Neutro Abs: 4092 cells/uL (ref 1500–7800)
Neutrophils Relative %: 59.3 %
Platelets: 211 10*3/uL (ref 140–400)
RBC: 3.95 10*6/uL (ref 3.80–5.10)
RDW: 11.8 % (ref 11.0–15.0)
Total Lymphocyte: 29.2 %
WBC: 6.9 10*3/uL (ref 3.8–10.8)

## 2023-04-23 LAB — LIPID PANEL
Cholesterol: 203 mg/dL — ABNORMAL HIGH
HDL: 74 mg/dL
LDL Cholesterol (Calc): 111 mg/dL — ABNORMAL HIGH
Non-HDL Cholesterol (Calc): 129 mg/dL
Total CHOL/HDL Ratio: 2.7 (calc)
Triglycerides: 87 mg/dL

## 2023-04-23 LAB — TSH: TSH: 4.63 mIU/L — ABNORMAL HIGH (ref 0.40–4.50)

## 2023-05-06 DIAGNOSIS — L578 Other skin changes due to chronic exposure to nonionizing radiation: Secondary | ICD-10-CM | POA: Diagnosis not present

## 2023-05-06 DIAGNOSIS — Z85828 Personal history of other malignant neoplasm of skin: Secondary | ICD-10-CM | POA: Diagnosis not present

## 2023-05-06 DIAGNOSIS — L570 Actinic keratosis: Secondary | ICD-10-CM | POA: Diagnosis not present

## 2023-05-06 DIAGNOSIS — L218 Other seborrheic dermatitis: Secondary | ICD-10-CM | POA: Diagnosis not present

## 2023-05-06 DIAGNOSIS — L853 Xerosis cutis: Secondary | ICD-10-CM | POA: Diagnosis not present

## 2023-05-06 DIAGNOSIS — L820 Inflamed seborrheic keratosis: Secondary | ICD-10-CM | POA: Diagnosis not present

## 2023-06-03 ENCOUNTER — Other Ambulatory Visit: Payer: Self-pay | Admitting: Nurse Practitioner

## 2023-06-03 DIAGNOSIS — F331 Major depressive disorder, recurrent, moderate: Secondary | ICD-10-CM

## 2023-06-05 ENCOUNTER — Other Ambulatory Visit: Payer: Self-pay | Admitting: Gastroenterology

## 2023-06-05 DIAGNOSIS — I8510 Secondary esophageal varices without bleeding: Secondary | ICD-10-CM

## 2023-06-11 ENCOUNTER — Encounter: Payer: Medicare Other | Admitting: Nurse Practitioner

## 2023-06-14 ENCOUNTER — Encounter: Payer: Medicare Other | Admitting: Nurse Practitioner

## 2023-06-18 ENCOUNTER — Ambulatory Visit (INDEPENDENT_AMBULATORY_CARE_PROVIDER_SITE_OTHER): Payer: Medicare Other | Admitting: Nurse Practitioner

## 2023-06-18 ENCOUNTER — Encounter: Payer: Self-pay | Admitting: Nurse Practitioner

## 2023-06-18 DIAGNOSIS — E2839 Other primary ovarian failure: Secondary | ICD-10-CM | POA: Diagnosis not present

## 2023-06-18 DIAGNOSIS — Z1231 Encounter for screening mammogram for malignant neoplasm of breast: Secondary | ICD-10-CM

## 2023-06-18 DIAGNOSIS — Z0000 Encounter for general adult medical examination without abnormal findings: Secondary | ICD-10-CM | POA: Diagnosis not present

## 2023-06-18 NOTE — Progress Notes (Signed)
Subjective:   Christina [name] is a 77 y.o. female who presents for Medicare Annual (Subsequent) preventive examination.  Visit Complete: Virtual  I connected with  Fabio Asa on 06/18/23 by a video and audio enabled telemedicine application and verified that I am speaking with the correct person using two identifiers.  Patient Location: Home  Provider Location: Office/Clinic  I discussed the limitations of evaluation and management by telemedicine. The patient expressed understanding and agreed to proceed.  Patient Medicare AWV questionnaire was completed by the patient on 06/18/23; I have confirmed that all information answered by patient is correct and no changes since this date.  Review of Systems     Cardiac Risk Factors include: advanced age (>92men, >26 women);sedentary lifestyle;hypertension;obesity (BMI >30kg/m2)     Objective:    There were no vitals filed for this visit. There is no height or weight on file to calculate BMI.     06/18/2023    9:08 AM 02/18/2023    1:51 PM 12/21/2022   10:25 AM 06/05/2022    2:16 PM 04/02/2022    8:31 AM 09/22/2021   10:31 AM 09/22/2021    8:47 AM  Advanced Directives  Does Patient Have a Medical Advance Directive? Yes Yes Yes Yes Yes Yes Yes  Type of Advance Directive Out of facility DNR (pink MOST or yellow form) Out of facility DNR (pink MOST or yellow form);Healthcare Power of [place];Living will Out of facility DNR (pink MOST or yellow form) Out of facility DNR (pink MOST or yellow form) Out of facility DNR (pink MOST or yellow form) Out of facility DNR (pink MOST or yellow form) Out of facility DNR (pink MOST or yellow form)  Does patient want to make changes to medical advance directive? No - Patient declined No - Patient declined No - Patient declined No - Patient declined No - Patient declined No - Patient declined No - Patient declined  Copy of Healthcare Power of Attorney in Chart?  No - copy requested       Pre-existing  out of facility DNR order (yellow form or pink MOST form)   Pink MOST form placed in chart (order not valid for inpatient use);Yellow form placed in chart (order not valid for inpatient use) Yellow form placed in chart (order not valid for inpatient use);Pink MOST form placed in chart (order not valid for inpatient use) Yellow form placed in chart (order not valid for inpatient use) Yellow form placed in chart (order not valid for inpatient use);Pink MOST form placed in chart (order not valid for inpatient use) Yellow form placed in chart (order not valid for inpatient use);Pink MOST form placed in chart (order not valid for inpatient use)    Current Medications (verified) Outpatient Encounter Medications as of 06/18/2023  Medication Sig   Cholecalciferol (VITAMIN D3) 2000 units TABS Take 1 tablet by mouth every morning.   furosemide (LASIX) 20 MG tablet Take 0.5 tablets (10 mg total) by mouth daily.   gabapentin (NEURONTIN) 300 MG capsule Take 1 capsule (300 mg total) by mouth 2 (two) times daily.   ibuprofen (ADVIL) 200 MG tablet Take 200 mg by mouth as needed.   ketoconazole (NIZORAL) 2 % shampoo APPLY TO AFFECTED AREA(S) 2 TIMES PER WEEK   lactulose (CHRONULAC) 10 GM/15ML solution TAKE 15 ML'S  BY MOUTH DAILY   levothyroxine (SYNTHROID) 100 MCG tablet TAKE ONE TABLET BY MOUTH DAILY   Multiple Vitamin (MULTIVITAMIN) tablet Take 1 tablet by mouth daily.  propranolol (INDERAL) 10 MG tablet Take 1 tablet (10 mg total) by mouth 2 (two) times daily. You are overdue for an OV. Please call to schedule an office visit as soon as possible.   spironolactone (ALDACTONE) 25 MG tablet TAKE 1 TABLET BY MOUTH DAILY   venlafaxine XR (EFFEXOR-XR) 75 MG 24 hr capsule TAKE ONE CAPSULE BY MOUTH EVERY MORNING WITH BREAKFAST   vitamin B-12 (CYANOCOBALAMIN) 1000 MCG tablet Take 1,000 mcg by mouth every morning.   No facility-administered encounter medications on file as of 06/18/2023.    Allergies  (verified) Patient has no known allergies.   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   Possible NASH overlap (MELD 13)   Anemia    Anxiety    Arthritis    Ascites    Back pain    Breast mass    Chronic low back pain    Complete right rotator cuff tear    Complication of anesthesia    during first surgery- woke up during surgery many years ago   Compression fracture of L4 lumbar vertebra    Dermatitis, eczematoid    Difficulty breathing    Gallstones    History of adenocarcinoma of breast    Hypercholesterolemia    Hyperkalemia    Hypertension    Hypokalemia    Hypothyroidism    Insomnia    Knee pain    Leukocytosis    Lymphadenopathy    Macrocytosis    Memory loss or impairment    Menopause    MGUS (monoclonal gammopathy of unknown significance)    Osteoarthritis    Osteoarthritis of right knee    Other cirrhosis of liver (HCC)    Renal insufficiency syndrome    Right knee DJD    Solitary thyroid nodule    Unspecified lump in the left breast, unspecified quadrant    Vitamin B 12 deficiency    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HCC)    Past Surgical History:  Procedure Laterality Date   BREAST SURGERY  2014   Removed lymphnodes   EYE SURGERY  2006   bilateral cataract   JOINT REPLACEMENT     right  (2017)and left knee   LAPAROSCOPIC CHOLECYSTECTOMY SINGLE SITE WITH INTRAOPERATIVE CHOLANGIOGRAM N/A 02/19/2018   Procedure: LAPAROSCOPIC CHOLECYSTECTOMY;  Surgeon: Karie Soda, MD;  Location: [facility];  Service: General;  Laterality: N/A;   LESION REMOVAL  09/2015   tubular adenoma-4 subcentimeter lesions   LIVER BIOPSY  02/19/2018   Procedure: LIVER BIOPSY;  Surgeon: Karie Soda, MD;  Location: [facility];  Service: General;;   right thumb surgery     SKIN CANCER EXCISION Right    Arm   TONSILLECTOMY      TOTAL KNEE ARTHROPLASTY Right 09/05/2016   UMBILICAL HERNIA REPAIR  02/19/2018   Procedure: REPAIR OF INCARCERATED UMBILICAL HERNIA;  Surgeon: Karie Soda, MD;  Location: [facility];  Service: General;;   Family History  Problem Relation Age of Onset   Breast cancer Mother    Colon cancer Neg Hx    Stomach cancer Neg Hx    Rectal cancer Neg Hx    Liver cancer Neg Hx    Esophageal cancer Neg Hx    Social History   Socioeconomic History   Marital status: Widowed    Spouse name: Not on file  Number of children: 6   Years of education: Not on file   Highest education level: Not on file  Occupational History   Occupation: retired  Tobacco Use   Smoking status: Never   Smokeless tobacco: Never  Vaping Use   Vaping Use: Never used  Substance and Sexual Activity   Alcohol use: No    Comment: 6 or more per day in the past.    Drug use: No   Sexual activity: Not Currently  Other Topics Concern   Not on file  Social History Narrative   Social History       Diet? Regular      Do you drink/eat things with caffeine? 1 cup      Marital status?                        married            What year were you married?      Do you live in a house, apartment, assisted living, condo, trailer, etc.? townhouse      Is it one or more stories? no      How many persons live in your home? two      Do you have any pets in your home? (please list) no      Highest level of education completed? High school      Current or past profession: realtor      Do you exercise?      no                                Type & how often?      Advanced Directives      Do you have a living will? yes      Do you have a DNR form?      yes                            If not, do you want to discuss one?      Do you have signed POA/HPOA for forms? yes      Functional Status      Do you have difficulty bathing or dressing yourself? no      Do you have difficulty preparing food or eating? no      Do you  have difficulty managing your medications? no      Do you have difficulty managing your finances? no      Do you have difficulty affording your medications? no   Social Determinants of Health   Financial Resource Strain: Low Risk  (03/19/2018)   Overall Financial Resource Strain (CARDIA)    Difficulty of Paying Living Expenses: Not hard at all  Food Insecurity: No Food Insecurity (03/19/2018)   Hunger Vital Sign    Worried About Running Out of Food in the Last Year: Never true    Ran Out of Food in the Last Year: Never true  Transportation Needs: No Transportation Needs (03/19/2018)   PRAPARE - Administrator, Civil Service (Medical): No    Lack of Transportation (Non-Medical): No  Physical Activity: Insufficiently Active (03/19/2018)   Exercise Vital Sign    Days of Exercise per Week: 7 days    Minutes of Exercise per Session: 20 min  Stress: Stress Concern Present (03/19/2018)   [employer]  of Occupational Health - Occupational Stress Questionnaire    Feeling of Stress : To some extent  Social Connections: Moderately Isolated (03/19/2018)   Social Connection and Isolation Panel [NHANES]    Frequency of Communication with Friends and Family: More than three times a week    Frequency of Social Gatherings with Friends and Family: More than three times a week    Attends Religious Services: Never    Database administrator or Organizations: No    Attends Banker Meetings: Never    Marital Status: Widowed    Tobacco Counseling Counseling given: Not Answered   Clinical Intake:     Pain : No/denies pain     BMI - recorded: 40 Nutritional Status: BMI > 30  Obese Nutritional Risks: None Diabetes: No  How often do you need to have someone help you when you read instructions, pamphlets, or other written materials from your doctor or pharmacy?: 3 - Sometimes         Activities of Daily Living    06/18/2023    9:08 AM  In your present state of  health, do you have any difficulty performing the following activities:  Hearing? 0  Vision? 0  Difficulty concentrating or making decisions? 0  Walking or climbing stairs? 1  Dressing or bathing? 0  Doing errands, shopping? 0  Preparing Food and eating ? N  Using the Toilet? N  In the past six months, have you accidently leaked urine? Y  Do you have problems with loss of bowel control? N  Managing your Medications? N  Managing your Finances? N  Housekeeping or managing your Housekeeping? N    Patient Care Team: Sharon Seller, NP as PCP - General (Geriatric Medicine) Armbruster, Willaim Rayas, MD as Consulting Physician (Gastroenterology) Karie Soda, MD as Consulting Physician (General Surgery)  Indicate any recent Medical Services you may have received from other than [facility] providers in the past year (date may be approximate).     Assessment:   This is a routine wellness examination for Christina [name].  Hearing/Vision screen Vision Screening - Comments:: Not sure the name of Eye Dr. Patient does see him yearly  Dietary issues and exercise activities discussed:     Goals Addressed   None    Depression Screen    06/18/2023    9:11 AM 12/21/2022   10:21 AM 06/05/2022    2:17 PM 09/22/2021   10:31 AM 06/01/2021   10:56 AM 03/22/2021   11:36 AM 05/05/2020    2:04 PM  PHQ 2/9 Scores  PHQ - 2 Score 4 3 0 0 0 4 0  PHQ- 9 Score 12 9    13      Fall Risk    06/18/2023    9:11 AM 04/22/2023   10:39 AM 02/18/2023    1:51 PM 12/21/2022   10:21 AM 08/28/2022   10:11 AM  Fall Risk   Falls in the past year? 0 0 0 1 1  Number falls in past yr: 0 0 0 0 1  Injury with Fall? 0 0 0 0 1  Risk for fall due to :  No Fall Risks No Fall Risks History of fall(s) History of fall(s)  Follow up  Falls evaluation completed Falls evaluation completed Falls evaluation completed Falls evaluation completed;Education provided    MEDICARE RISK AT HOME:   TIMED UP AND GO:  Was the test performed?   No    Cognitive Function:    03/19/2018  10:17 AM  MMSE - Mini Mental State Exam  Orientation to time 5  Orientation to Place 5  Registration 3  Attention/ Calculation 5  Recall 3  Language- name 2 objects 2  Language- repeat 1  Language- follow 3 step command 3  Language- read & follow direction 1  Write a sentence 1  Copy design 1  Total score 30        06/18/2023    9:13 AM 06/05/2022    2:18 PM 06/01/2021   11:00 AM 05/05/2020    1:59 PM 05/04/2019    4:03 PM  6CIT Screen  What Year?  0 points 0 points 0 points 0 points  What month?  0 points 0 points 0 points 0 points  What time? 0 points 3 points 0 points 0 points 0 points  Count back from 20 0 points 0 points 0 points 0 points 0 points  Months in reverse 0 points 0 points 0 points 2 points 2 points  Repeat phrase 0 points 0 points 0 points 2 points 0 points  Total Score  3 points 0 points 4 points 2 points    Immunizations Immunization History  Administered Date(s) Administered   Fluad Quad(high Dose 65+) 09/10/2019, 09/12/2020, 12/21/2022   Hep A / Hep B 01/13/2018, 02/12/2018, 07/14/2018   Influenza Inj Mdck Quad Pf 10/12/2013   Influenza, High Dose Seasonal PF 09/09/2017, 10/06/2018   Influenza-Unspecified 09/10/2007, 09/22/2008, 10/15/2012, 09/29/2014, 08/24/2016   PFIZER(Purple Top)SARS-COV-2 Vaccination 02/19/2020, 03/16/2020, 09/30/2020, 03/26/2023   Pneumococcal Conjugate-13 12/30/2017   Pneumococcal Polysaccharide-23 06/25/2013   Tdap 09/02/2015   Zoster Recombinat (Shingrix) 06/25/2017, 10/03/2017   Zoster, Live 12/28/2013    TDAP status: Up to date  Flu Vaccine status: Up to date  Pneumococcal vaccine status: Up to date  Covid-19 vaccine status: Information provided on how to obtain vaccines.   Qualifies for Shingles Vaccine? Yes   Zostavax completed No   Shingrix Completed?: Yes  Screening Tests Health Maintenance  Topic Date Due   DEXA SCAN  08/26/2021   COVID-19 Vaccine (5 - 2023-24  season) 05/21/2023   INFLUENZA VACCINE  07/25/2023   Medicare Annual Wellness (AWV)  06/17/2024   DTaP/Tdap/Td (2 - Td or Tdap) 09/01/2025   Colonoscopy  11/28/2025   Pneumonia Vaccine 17+ Years old  Completed   Hepatitis C Screening  Completed   Zoster Vaccines- Shingrix  Completed   HPV VACCINES  Aged Out    Health Maintenance  Health Maintenance Due  Topic Date Due   DEXA SCAN  08/26/2021   COVID-19 Vaccine (5 - 2023-24 season) 05/21/2023    Colorectal cancer screening: Type of screening: Colonoscopy. Completed 2023. Repeat every 3 years  Mammogram status: Ordered and plans to schedule. Pt provided with contact info and advised to call to schedule appt.   Bone Density status: Ordered today. Pt provided with contact info and advised to call to schedule appt.  Lung Cancer Screening: (Low Dose CT Chest recommended if Age 62-80 years, 20 pack-year currently smoking OR have quit w/in 15years.) does not qualify.   Lung Cancer Screening Referral: na  Additional Screening:  Hepatitis C Screening: does qualify; Completed   Vision Screening: Recommended annual ophthalmology exams for early detection of glaucoma and other disorders of the eye. Is the patient up to date with their annual eye exam?  Yes  Who is the provider or what is the name of the office in which the patient attends annual eye exams? unknown If pt is  not established with a provider, would they like to be referred to a provider to establish care? No .   Dental Screening: Recommended annual dental exams for proper oral hygiene   Community Resource Referral / Chronic Care Management: CRR required this visit?  No   CCM required this visit?  No     Plan:     I have personally reviewed and noted the following in the patient's chart:   Medical and social history Use of alcohol, tobacco or illicit drugs  Current medications and supplements including opioid prescriptions. Patient is not currently taking opioid  prescriptions. Functional ability and status Nutritional status Physical activity Advanced directives List of other physicians Hospitalizations, surgeries, and ER visits in previous 12 months Vitals Screenings to include cognitive, depression, and falls Referrals and appointments  In addition, I have reviewed and discussed with patient certain preventive protocols, quality metrics, and best practice recommendations. A written personalized care plan for preventive services as well as general preventive health recommendations were provided to patient.     Sharon Seller, NP   06/18/2023   After Visit Summary: (MyChart) Due to this being a telephonic visit, the after visit summary with patients personalized plan was offered to patient via MyChart

## 2023-06-18 NOTE — Patient Instructions (Signed)
  Christina [name] , Thank you for taking time to come for your Medicare Wellness Visit. I appreciate your ongoing commitment to your health goals. Please review the following plan we discussed and let me know if I can assist you in the future.   These are the goals we discussed:  Goals      Patient Stated     To maintain current level of health     Patient Stated     Being able to walk better         This is a list of the screening recommended for you and due dates:  Health Maintenance  Topic Date Due   DEXA scan (bone density measurement)  08/26/2021   COVID-19 Vaccine (5 - 2023-24 season) 05/21/2023   Flu Shot  07/25/2023   Medicare Annual Wellness Visit  06/17/2024   DTaP/Tdap/Td vaccine (2 - Td or Tdap) 09/01/2025   Colon Cancer Screening  11/28/2025   Pneumonia Vaccine  Completed   Hepatitis C Screening  Completed   Zoster (Shingles) Vaccine  Completed   HPV Vaccine  Aged Out

## 2023-06-18 NOTE — Progress Notes (Signed)
  This service is provided via telemedicine  No vital signs collected/recorded due to the encounter was a telemedicine visit.   Location of patient (ex: home, work):  Home  Patient consents to a telephone visit:  Yes  Location of the provider (ex: office, home):  Office [place].   Name of any referring provider:  na  Names of all persons participating in the telemedicine service and their role in the encounter:  Vicie Mutters, Patient, Nelda Severe, CMA, Abbey Chatters, NP  Time spent on call:  7:50

## 2023-06-20 ENCOUNTER — Other Ambulatory Visit: Payer: Self-pay | Admitting: Gastroenterology

## 2023-06-20 ENCOUNTER — Other Ambulatory Visit: Payer: Self-pay | Admitting: Nurse Practitioner

## 2023-06-20 DIAGNOSIS — G603 Idiopathic progressive neuropathy: Secondary | ICD-10-CM

## 2023-06-20 NOTE — Telephone Encounter (Signed)
Okay to refill.  Thanks.

## 2023-07-02 ENCOUNTER — Other Ambulatory Visit: Payer: Self-pay | Admitting: Nurse Practitioner

## 2023-07-02 DIAGNOSIS — E890 Postprocedural hypothyroidism: Secondary | ICD-10-CM

## 2023-08-03 ENCOUNTER — Other Ambulatory Visit: Payer: Self-pay | Admitting: Gastroenterology

## 2023-08-03 DIAGNOSIS — I8510 Secondary esophageal varices without bleeding: Secondary | ICD-10-CM

## 2023-08-16 ENCOUNTER — Telehealth: Payer: Self-pay

## 2023-08-16 DIAGNOSIS — G603 Idiopathic progressive neuropathy: Secondary | ICD-10-CM

## 2023-08-16 NOTE — Telephone Encounter (Signed)
Patient called stating that the gabapentin doesn't seem to be helping with her neuropathy. Is there any suggestion of what she should do ot anything else she can take.  Message sent to Abbey Chatters, NP

## 2023-08-19 NOTE — Telephone Encounter (Signed)
 Has she tried lyrica?

## 2023-08-20 MED ORDER — PREGABALIN 50 MG PO CAPS
50.0000 mg | ORAL_CAPSULE | Freq: Two times a day (BID) | ORAL | 0 refills | Status: DC
Start: 2023-08-20 — End: 2023-11-05

## 2023-08-20 NOTE — Telephone Encounter (Signed)
I spoke with patient and she sated that she has not tried lyrica.

## 2023-08-20 NOTE — Telephone Encounter (Signed)
To stop gabapentin and start lyrica 50 mg by mouth twice daily, new Rx sent to pharmacy

## 2023-08-20 NOTE — Telephone Encounter (Signed)
I spoke with patient and she was made aware of medication changes. She verbalized her understanding and agreed.

## 2023-09-02 ENCOUNTER — Ambulatory Visit (INDEPENDENT_AMBULATORY_CARE_PROVIDER_SITE_OTHER): Payer: Medicare Other | Admitting: Nurse Practitioner

## 2023-09-02 ENCOUNTER — Encounter: Payer: Self-pay | Admitting: Nurse Practitioner

## 2023-09-02 VITALS — BP 132/74 | HR 60 | Temp 97.1°F | Ht <= 58 in | Wt 203.0 lb

## 2023-09-02 DIAGNOSIS — F331 Major depressive disorder, recurrent, moderate: Secondary | ICD-10-CM

## 2023-09-02 DIAGNOSIS — E890 Postprocedural hypothyroidism: Secondary | ICD-10-CM

## 2023-09-02 DIAGNOSIS — I700 Atherosclerosis of aorta: Secondary | ICD-10-CM | POA: Diagnosis not present

## 2023-09-02 DIAGNOSIS — Z23 Encounter for immunization: Secondary | ICD-10-CM | POA: Diagnosis not present

## 2023-09-02 DIAGNOSIS — E785 Hyperlipidemia, unspecified: Secondary | ICD-10-CM | POA: Diagnosis not present

## 2023-09-02 DIAGNOSIS — K7682 Hepatic encephalopathy: Secondary | ICD-10-CM

## 2023-09-02 DIAGNOSIS — K7031 Alcoholic cirrhosis of liver with ascites: Secondary | ICD-10-CM

## 2023-09-02 DIAGNOSIS — G603 Idiopathic progressive neuropathy: Secondary | ICD-10-CM

## 2023-09-02 DIAGNOSIS — Z6841 Body Mass Index (BMI) 40.0 and over, adult: Secondary | ICD-10-CM

## 2023-09-02 NOTE — Progress Notes (Signed)
Careteam: Patient Care Team: Sharon Seller, NP as PCP - General (Geriatric Medicine) Armbruster, Willaim Rayas, MD as Consulting Physician (Gastroenterology) Karie Soda, MD as Consulting Physician (General Surgery)  PLACE OF SERVICE:  [facility]  Advanced Directive information Does Patient Have a Medical Advance Directive?: Yes, Type of Advance Directive: Out of facility DNR (pink MOST or yellow form), Pre-existing out of facility DNR order (yellow form or pink MOST form): Yellow form placed in chart (order not valid for inpatient use);Pink MOST form placed in chart (order not valid for inpatient use), Does patient want to make changes to medical advance directive?: No - Patient declined  No Known Allergies  Chief Complaint  Patient presents with   Medical Management of Chronic Issues    4 month follow-up. Discuss need for covid boosters and flu vaccine. Positive depression screening, scored 14      HPI: Patient is a 77 y.o. female for routine follow up  She has not seen psychologist or psychiatrist. Thought she could handle depression without. She does feel like she would benefit  Continues on effexor  She has been munching too much- has gained weight.  Wonders if has put on more fluid around her liver. She is to call and set up appt for abdominal US to make sure she does not have worsening ascites   Has seen the dentist- her teeth are loose, bone is disappearing  Wanting to pull teeth and get dentures.   Neuropathy is better on lyrica. She is not waking up in so much pain.  Always has neck pain. Continues to have neuropathy in hands.   Did not get chest xray after last visit- reports shortness of breath has not worsen and overall better. Mostly with activity and stops when she rest.   Got a new sewing machine and now making custom purses.  Review of Systems:  Review of Systems  Constitutional:  Negative for chills, fever and weight loss.  HENT:  Negative for  tinnitus.   Respiratory:  Negative for cough, sputum production and shortness of breath.   Cardiovascular:  Negative for chest pain, palpitations and leg swelling.  Gastrointestinal:  Negative for abdominal pain, constipation, diarrhea and heartburn.  Genitourinary:  Negative for dysuria, frequency and urgency.  Musculoskeletal:  Negative for back pain, falls, joint pain and myalgias.  Skin: Negative.   Neurological:  Positive for tingling and sensory change. Negative for dizziness and headaches.  Psychiatric/Behavioral:  Positive for depression. Negative for memory loss. The patient does not have insomnia.     Past Medical History:  Diagnosis Date   Abnormal EKG    Abnormal finding on thyroid function test    Abnormal liver function test    Acute edema    Alcoholic cirrhosis (HCC) 07/02/2016   Possible NASH overlap (MELD 13)   Anemia    Anxiety    Arthritis    Ascites    Back pain    Breast mass    Chronic low back pain    Complete right rotator cuff tear    Complication of anesthesia    during first surgery- woke up during surgery many years ago   Compression fracture of L4 lumbar vertebra    Dermatitis, eczematoid    Difficulty breathing    Gallstones    History of adenocarcinoma of breast    Hypercholesterolemia    Hyperkalemia    Hypertension    Hypokalemia    Hypothyroidism    Insomnia  Knee pain    Leukocytosis    Lymphadenopathy    Macrocytosis    Memory loss or impairment    Menopause    MGUS (monoclonal gammopathy of unknown significance)    Osteoarthritis    Osteoarthritis of right knee    Other cirrhosis of liver (HCC)    Renal insufficiency syndrome    Right knee DJD    Solitary thyroid nodule    Unspecified lump in the left breast, unspecified quadrant    Vitamin B 12 deficiency    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HCC)    Past Surgical History:  Procedure Laterality Date   BREAST SURGERY  2014   Removed lymphnodes   EYE SURGERY  2006    bilateral cataract   JOINT REPLACEMENT     right  (2017)and left knee   LAPAROSCOPIC CHOLECYSTECTOMY SINGLE SITE WITH INTRAOPERATIVE CHOLANGIOGRAM N/A 02/19/2018   Procedure: LAPAROSCOPIC CHOLECYSTECTOMY;  Surgeon: Karie Soda, MD;  Location: [facility];  Service: General;  Laterality: N/A;   LESION REMOVAL  09/2015   tubular adenoma-4 subcentimeter lesions   LIVER BIOPSY  02/19/2018   Procedure: LIVER BIOPSY;  Surgeon: Karie Soda, MD;  Location: [facility];  Service: General;;   right thumb surgery     SKIN CANCER EXCISION Right    Arm   TONSILLECTOMY     TOTAL KNEE ARTHROPLASTY Right 09/05/2016   UMBILICAL HERNIA REPAIR  02/19/2018   Procedure: REPAIR OF INCARCERATED UMBILICAL HERNIA;  Surgeon: Karie Soda, MD;  Location: [facility];  Service: General;;   Social History:   reports that she has never smoked. She has never used smokeless tobacco. She reports current alcohol use. She reports that she does not use drugs.  Family History  Problem Relation Age of Onset   Breast cancer Mother    Colon cancer Neg Hx    Stomach cancer Neg Hx    Rectal cancer Neg Hx    Liver cancer Neg Hx    Esophageal cancer Neg Hx     Medications: Patient's Medications  New Prescriptions   No medications on file  Previous Medications   CHOLECALCIFEROL (VITAMIN D3) 2000 UNITS TABS    Take 1 tablet by mouth every morning.   FUROSEMIDE (LASIX) 20 MG TABLET    Take 0.5 tablets (10 mg total) by mouth daily.   IBUPROFEN (ADVIL) 200 MG TABLET    Take 200 mg by mouth as needed.   KETOCONAZOLE (NIZORAL) 2 % SHAMPOO    APPLY TO AFFECTED AREA(S) 2 TIMES PER WEEK   LACTULOSE (CHRONULAC) 10 GM/15ML SOLUTION    TAKE 15 ML'S  BY MOUTH DAILY   LEVOTHYROXINE (SYNTHROID) 100 MCG TABLET    TAKE 1 TABLET BY MOUTH DAILY   MULTIPLE VITAMIN (MULTIVITAMIN) TABLET    Take 1 tablet by mouth daily.   PREGABALIN (LYRICA) 50 MG CAPSULE    Take 1 capsule (50 mg total) by mouth 2 (two) times daily.   PROPRANOLOL (INDERAL) 10 MG  TABLET    Take 1 tablet (10 mg total) by mouth 2 (two) times daily. Please keep your October appointment for further refills. Thank you   SPIRONOLACTONE (ALDACTONE) 25 MG TABLET    TAKE 1 TABLET BY MOUTH DAILY   VENLAFAXINE XR (EFFEXOR-XR) 75 MG 24 HR CAPSULE    TAKE ONE CAPSULE BY MOUTH EVERY MORNING WITH BREAKFAST   VITAMIN B-12 (CYANOCOBALAMIN) 1000 MCG TABLET    Take 1,000 mcg by mouth every morning.  Modified Medications   No medications on file  Discontinued Medications   No medications on file    Physical Exam:  Vitals:   09/02/23 0953  BP: 132/74  Pulse: 60  Temp: (!) 97.1 F (36.2 C)  TempSrc: Temporal  SpO2: 99%  Weight: 203 lb (92.1 kg)  Height: 4\' 10"  (1.473 m)   Body mass index is 42.43 kg/m. Wt Readings from Last 3 Encounters:  09/02/23 203 lb (92.1 kg)  04/22/23 194 lb 6.4 oz (88.2 kg)  12/20/22 196 lb (88.9 kg)    Physical Exam Constitutional:      General: She is not in acute distress.    Appearance: She is well-developed. She is not diaphoretic.  HENT:     Head: Normocephalic and atraumatic.     Mouth/Throat:     Pharynx: No oropharyngeal exudate.  Eyes:     Conjunctiva/sclera: Conjunctivae normal.     Pupils: Pupils are equal, round, and reactive to light.  Cardiovascular:     Rate and Rhythm: Normal rate and regular rhythm.     Heart sounds: Normal heart sounds.  Pulmonary:     Effort: Pulmonary effort is normal.     Breath sounds: Normal breath sounds.  Abdominal:     General: Bowel sounds are normal.     Palpations: Abdomen is soft.  Musculoskeletal:     Cervical back: Normal range of motion and neck supple.     Right lower leg: No edema.     Left lower leg: No edema.  Skin:    General: Skin is warm and dry.  Neurological:     Mental Status: She is alert and oriented to person, place, and time.  Psychiatric:        Mood and Affect: Mood normal.    Labs reviewed: Basic Metabolic Panel: Recent Labs    10/24/22 1207 04/22/23 1135   NA 137 142  K 4.0 4.0  CL 98 102  CO2 32 31  GLUCOSE 90 96  BUN 15 20  CREATININE 0.70 0.88  CALCIUM 9.4 9.5  TSH  --  4.63*   Liver Function Tests: Recent Labs    10/24/22 1207 04/22/23 1135  AST 21 19  ALT 13 12  ALKPHOS 54  --   BILITOT 0.7 0.7  PROT 7.6 7.1  ALBUMIN 3.8  --    No results for input(s): "LIPASE", "AMYLASE" in the last 8760 hours. No results for input(s): "AMMONIA" in the last 8760 hours. CBC: Recent Labs    10/24/22 1207 04/22/23 1135  WBC 8.5 6.9  NEUTROABS 4.9 4,092  HGB 11.8* 12.2  HCT 35.0* 36.0  MCV 92.8 91.1  PLT 224.0 211   Lipid Panel: Recent Labs    04/22/23 1135  CHOL 203*  HDL 74  LDLCALC 111*  TRIG 87  CHOLHDL 2.7   TSH: Recent Labs    04/22/23 1135  TSH 4.63*   A1C: No results found for: "HGBA1C"   Assessment/Plan 1. Postoperative hypothyroidism -continues on synthroid 100 mcg - TSH  2. Class 3 severe obesity due to excess calories without serious comorbidity with body mass index (BMI) of 40.0 to 44.9 in adult [facility]) -suspect some weight gain due to ascites which she is planing on getting [place] -education provided on healthy weight loss through increase in physical activity and proper nutrition   3. Hyperlipidemia, unspecified hyperlipidemia type - continue dietary modifications -Lipid panel - COMPLETE METABOLIC PANEL WITH GFR  4. Idiopathic progressive neuropathy Improved with lyrica.   5. Need for influenza vaccination - Flu  Vaccine Trivalent High Dose (Fluad)  6. Depression, major, recurrent, moderate (HCC) Ongoing, she is ready to see a therapist at this time.  Will place referral to psychology - Ambulatory referral to Psychology  7. Aortic atherosclerosis (HCC) Noted on imaging. Unable to take ASA due to increase bleeding risk due to cirrhosis - CBC with Differential/Platelet  8. Hepatic encephalopathy (HCC) -continues on lactulose.   9. Alcoholic cirrhosis of liver with ascites (HCC) Feeling  like she is holding onto more fluid in the abdomen. She is planning to schedule abdominal US for further evaluation.     Return in about 6 months (around 03/01/2024) for routine follow up .  Janene Harvey. Biagio Borg [facility] [phone]

## 2023-09-02 NOTE — Patient Instructions (Signed)
Call [facility] FOR MAMMOGRAM and BONE DENSITY

## 2023-09-03 LAB — COMPLETE METABOLIC PANEL WITH GFR
AG Ratio: 1.1 (calc) (ref 1.0–2.5)
ALT: 11 U/L (ref 6–29)
AST: 19 U/L (ref 10–35)
Albumin: 3.9 g/dL (ref 3.6–5.1)
Alkaline phosphatase (APISO): 64 U/L (ref 37–153)
BUN: 12 mg/dL (ref 7–25)
CO2: 33 mmol/L — ABNORMAL HIGH (ref 20–32)
Calcium: 10.3 mg/dL (ref 8.6–10.4)
Chloride: 100 mmol/L (ref 98–110)
Creat: 0.78 mg/dL (ref 0.60–1.00)
Globulin: 3.4 g/dL (ref 1.9–3.7)
Glucose, Bld: 99 mg/dL (ref 65–99)
Potassium: 4.6 mmol/L (ref 3.5–5.3)
Sodium: 140 mmol/L (ref 135–146)
Total Bilirubin: 0.5 mg/dL (ref 0.2–1.2)
Total Protein: 7.3 g/dL (ref 6.1–8.1)
eGFR: 78 mL/min/{1.73_m2} (ref >=60)

## 2023-09-03 LAB — LIPID PANEL
Cholesterol: 224 mg/dL — ABNORMAL HIGH (ref <200)
HDL: 80 mg/dL (ref >=50)
LDL Cholesterol (Calc): 127 mg/dL — ABNORMAL HIGH
Non-HDL Cholesterol (Calc): 144 mg/dL — ABNORMAL HIGH (ref <130)
Total CHOL/HDL Ratio: 2.8 (calc) (ref <5.0)
Triglycerides: 80 mg/dL (ref <150)

## 2023-09-03 LAB — CBC WITH DIFFERENTIAL/PLATELET
Absolute Monocytes: 703 {cells}/uL (ref 200–950)
Basophils Absolute: 50 {cells}/uL (ref 0–200)
Basophils Relative: 0.7 %
Eosinophils Absolute: 114 {cells}/uL (ref 15–500)
Eosinophils Relative: 1.6 %
HCT: 37.6 % (ref 35.0–45.0)
Hemoglobin: 12.3 g/dL (ref 11.7–15.5)
Lymphs Abs: 2016 {cells}/uL (ref 850–3900)
MCH: 30.9 pg (ref 27.0–33.0)
MCHC: 32.7 g/dL (ref 32.0–36.0)
MCV: 94.5 fL (ref 80.0–100.0)
MPV: 10.8 fL (ref 7.5–12.5)
Monocytes Relative: 9.9 %
Neutro Abs: 4217 {cells}/uL (ref 1500–7800)
Neutrophils Relative %: 59.4 %
Platelets: 192 10*3/uL (ref 140–400)
RBC: 3.98 10*6/uL (ref 3.80–5.10)
RDW: 11.8 % (ref 11.0–15.0)
Total Lymphocyte: 28.4 %
WBC: 7.1 10*3/uL (ref 3.8–10.8)

## 2023-09-03 LAB — TSH: TSH: 2.76 m[IU]/L (ref 0.40–4.50)

## 2023-09-12 ENCOUNTER — Encounter: Payer: Self-pay | Admitting: Nurse Practitioner

## 2023-09-12 DIAGNOSIS — R4589 Other symptoms and signs involving emotional state: Secondary | ICD-10-CM

## 2023-09-12 NOTE — Telephone Encounter (Signed)
Message routed to PCP Janyth Contes, Janene Harvey, NP

## 2023-09-13 MED ORDER — LORAZEPAM 0.5 MG PO TABS
0.5000 mg | ORAL_TABLET | Freq: Every day | ORAL | 0 refills | Status: AC | PRN
Start: 2023-09-13 — End: ?

## 2023-09-13 NOTE — Telephone Encounter (Signed)
Message routed to PCP Janyth Contes, Janene Harvey, NP again. Patient keeps calling

## 2023-09-17 ENCOUNTER — Telehealth: Payer: Medicare Other

## 2023-09-17 NOTE — Telephone Encounter (Signed)
Patient called to say the dentist never received paperwork and asked that we re-fax (see media tab). Form was re-submitted today at 1:06 pm to [phone]. I received confirmation that fax successfully went through.

## 2023-10-04 ENCOUNTER — Ambulatory Visit: Payer: Medicare Other | Admitting: Gastroenterology

## 2023-10-25 HISTORY — PX: MOUTH SURGERY: SHX715

## 2023-11-04 ENCOUNTER — Other Ambulatory Visit: Payer: Self-pay | Admitting: Gastroenterology

## 2023-11-04 ENCOUNTER — Other Ambulatory Visit: Payer: Self-pay | Admitting: Nurse Practitioner

## 2023-11-04 DIAGNOSIS — I8510 Secondary esophageal varices without bleeding: Secondary | ICD-10-CM

## 2023-11-04 DIAGNOSIS — G603 Idiopathic progressive neuropathy: Secondary | ICD-10-CM

## 2023-11-05 NOTE — Telephone Encounter (Signed)
Patient has request refill on medication Lyrica. Patient medication last refilled 08/20/2023. Patient has No Contract on file. Medication pend and sent to PCP Janyth Contes Janene Harvey, NP for approval.

## 2023-12-09 ENCOUNTER — Other Ambulatory Visit: Payer: Self-pay | Admitting: Gastroenterology

## 2023-12-09 DIAGNOSIS — I8510 Secondary esophageal varices without bleeding: Secondary | ICD-10-CM

## 2023-12-10 ENCOUNTER — Other Ambulatory Visit: Payer: Self-pay

## 2023-12-10 DIAGNOSIS — G603 Idiopathic progressive neuropathy: Secondary | ICD-10-CM

## 2023-12-10 MED ORDER — PREGABALIN 50 MG PO CAPS
50.0000 mg | ORAL_CAPSULE | Freq: Two times a day (BID) | ORAL | 3 refills | Status: AC
Start: 2023-12-10 — End: ?

## 2023-12-10 NOTE — Telephone Encounter (Signed)
Incoming fax received from pharmacy stating patient prefers a 90 days supply versus recent approval for 30 days at a time   Please review and approve if warranted (controlled substance)

## 2023-12-17 ENCOUNTER — Other Ambulatory Visit: Payer: Self-pay | Admitting: Gastroenterology

## 2023-12-25 DIAGNOSIS — U071 COVID-19: Secondary | ICD-10-CM

## 2023-12-25 HISTORY — DX: COVID-19: U07.1

## 2023-12-28 ENCOUNTER — Other Ambulatory Visit: Payer: Self-pay | Admitting: Nurse Practitioner

## 2023-12-28 DIAGNOSIS — E890 Postprocedural hypothyroidism: Secondary | ICD-10-CM

## 2024-01-03 ENCOUNTER — Ambulatory Visit: Payer: Medicare Other | Admitting: Physician Assistant

## 2024-01-07 ENCOUNTER — Ambulatory Visit: Payer: Medicare Other | Admitting: Gastroenterology

## 2024-01-26 ENCOUNTER — Other Ambulatory Visit: Payer: Self-pay | Admitting: Gastroenterology

## 2024-01-31 DIAGNOSIS — L570 Actinic keratosis: Secondary | ICD-10-CM | POA: Diagnosis not present

## 2024-01-31 DIAGNOSIS — L821 Other seborrheic keratosis: Secondary | ICD-10-CM | POA: Diagnosis not present

## 2024-01-31 DIAGNOSIS — D2239 Melanocytic nevi of other parts of face: Secondary | ICD-10-CM | POA: Diagnosis not present

## 2024-01-31 DIAGNOSIS — L820 Inflamed seborrheic keratosis: Secondary | ICD-10-CM | POA: Diagnosis not present

## 2024-01-31 DIAGNOSIS — Z85828 Personal history of other malignant neoplasm of skin: Secondary | ICD-10-CM | POA: Diagnosis not present

## 2024-01-31 DIAGNOSIS — C44629 Squamous cell carcinoma of skin of left upper limb, including shoulder: Secondary | ICD-10-CM | POA: Diagnosis not present

## 2024-02-04 ENCOUNTER — Telehealth: Payer: Self-pay

## 2024-02-04 NOTE — Telephone Encounter (Signed)
Patient scheduled to see Sharon Seller, NP tomorrow

## 2024-02-04 NOTE — Telephone Encounter (Signed)
Patient called to say she was exposed to the flu on Sunday and Sunday evening she began having fever (now subsided), stuffy at time, congestion, and productive cough (clear). Patient would like a rx for tamiflu sent to [facility]. No availability for a video or in person visit

## 2024-02-04 NOTE — Telephone Encounter (Signed)
Would recommend her coming in and getting tested for flu (or she can get go get test OTC) prior to prescribing tamiflu.  She can be seen tomorrow

## 2024-02-05 ENCOUNTER — Telehealth (INDEPENDENT_AMBULATORY_CARE_PROVIDER_SITE_OTHER): Payer: Medicare Other | Admitting: Nurse Practitioner

## 2024-02-05 ENCOUNTER — Encounter: Payer: Self-pay | Admitting: Nurse Practitioner

## 2024-02-05 DIAGNOSIS — J069 Acute upper respiratory infection, unspecified: Secondary | ICD-10-CM

## 2024-02-05 MED ORDER — ALBUTEROL SULFATE HFA 108 (90 BASE) MCG/ACT IN AERS: 2.0000 | INHALATION_SPRAY | Freq: Four times a day (QID) | RESPIRATORY_TRACT | 2 refills | Status: AC | PRN

## 2024-02-05 NOTE — Progress Notes (Signed)
Careteam: Patient Care Team: Sharon Seller, NP as PCP - General (Geriatric Medicine) Armbruster, Willaim Rayas, MD as Consulting Physician (Gastroenterology) Karie Soda, MD as Consulting Physician (General Surgery)  Advanced Directive information Does Patient Have a Medical Advance Directive?: Yes, Type of Advance Directive: Healthcare Power of [place];Living will;Out of facility DNR (pink MOST or yellow form), Pre-existing out of facility DNR order (yellow form or pink MOST form): Pink MOST/Yellow Form most recent copy in chart - Physician notified to receive inpatient order, Does patient want to make changes to medical advance directive?: No - Patient declined  No Known Allergies  Chief Complaint  Patient presents with   URI    Acute visit     HPI: Patient is a 78 y.o. female for a cold for 2 days Reports she had head congestion and chest congestion.  She has a cough and ribs hurt from cough  Nonproductive.  Taking mucinex sinus max Had fever one day but none since.  She is able to drink tea, water and soda. Having soup  Shortness of breath with cough Has some wheezing.   Review of Systems:  Review of Systems  Constitutional:  Positive for malaise/fatigue. Negative for chills and fever.  HENT:  Positive for sore throat. Negative for congestion.   Eyes:  Positive for double vision.  Respiratory:  Positive for cough. Negative for sputum production.   Neurological:  Positive for weakness.   Past Medical History:  Diagnosis Date   Abnormal EKG    Abnormal finding on thyroid function test    Abnormal liver function test    Acute edema    Alcoholic cirrhosis (HCC) 07/02/2016   Possible NASH overlap (MELD 13)   Anemia    Anxiety    Arthritis    Ascites    Back pain    Breast mass    Chronic low back pain    Complete right rotator cuff tear    Complication of anesthesia    during first surgery- woke up during surgery many years ago   Compression fracture of  L4 lumbar vertebra    COVID-19 12/2023   Dermatitis, eczematoid    Difficulty breathing    Gallstones    History of adenocarcinoma of breast    Hypercholesterolemia    Hyperkalemia    Hypertension    Hypokalemia    Hypothyroidism    Insomnia    Knee pain    Leukocytosis    Lymphadenopathy    Macrocytosis    Memory loss or impairment    Menopause    MGUS (monoclonal gammopathy of unknown significance)    Osteoarthritis    Osteoarthritis of right knee    Other cirrhosis of liver (HCC)    Renal insufficiency syndrome    Right knee DJD    Solitary thyroid nodule    Unspecified lump in the left breast, unspecified quadrant    Vitamin B 12 deficiency    Waldenstrom macroglobulinemia    Past Surgical History:  Procedure Laterality Date   BREAST SURGERY  2014   Removed lymphnodes   EYE SURGERY  2006   bilateral cataract   JOINT REPLACEMENT     right  (2017)and left knee   LAPAROSCOPIC CHOLECYSTECTOMY SINGLE SITE WITH INTRAOPERATIVE CHOLANGIOGRAM N/A 02/19/2018   Procedure: LAPAROSCOPIC CHOLECYSTECTOMY;  Surgeon: Karie Soda, MD;  Location: [facility];  Service: General;  Laterality: N/A;   LESION REMOVAL  09/2015   tubular adenoma-4 subcentimeter lesions   LIVER BIOPSY  02/19/2018  Procedure: LIVER BIOPSY;  Surgeon: Karie Soda, MD;  Location: [facility];  Service: General;;   right thumb surgery     SKIN CANCER EXCISION Right    Arm   TONSILLECTOMY     TOTAL KNEE ARTHROPLASTY Right 09/05/2016   UMBILICAL HERNIA REPAIR  02/19/2018   Procedure: REPAIR OF INCARCERATED UMBILICAL HERNIA;  Surgeon: Karie Soda, MD;  Location: [facility];  Service: General;;   Social History:   reports that she has never smoked. She has never used smokeless tobacco. She reports current alcohol use. She reports that she does not use drugs.  Family History  Problem Relation Age of Onset   Breast cancer Mother    Colon cancer Neg Hx    Stomach cancer Neg Hx    Rectal cancer Neg Hx    Liver  cancer Neg Hx    Esophageal cancer Neg Hx     Medications: Patient's Medications  New Prescriptions   No medications on file  Previous Medications   CHOLECALCIFEROL (VITAMIN D3) 2000 UNITS TABS    Take 1 tablet by mouth every morning.   FUROSEMIDE (LASIX) 20 MG TABLET    Take 0.5 tablets (10 mg total) by mouth daily.   IBUPROFEN (ADVIL) 200 MG TABLET    Take 200 mg by mouth as needed.   KETOCONAZOLE (NIZORAL) 2 % SHAMPOO    APPLY TO AFFECTED AREA(S) 2 TIMES PER WEEK   LACTULOSE (CHRONULAC) 10 GM/15ML SOLUTION    TAKE 15 ML'S  BY MOUTH DAILY   LEVOTHYROXINE (SYNTHROID) 100 MCG TABLET    TAKE 1 TABLET BY MOUTH DAILY   LORAZEPAM (ATIVAN) 0.5 MG TABLET    Take 1 tablet (0.5 mg total) by mouth daily as needed for anxiety (prior to dental procedure).   MULTIPLE VITAMIN (MULTIVITAMIN) TABLET    Take 1 tablet by mouth daily.   PREGABALIN (LYRICA) 50 MG CAPSULE    Take 1 capsule (50 mg total) by mouth 2 (two) times daily.   PROPRANOLOL (INDERAL) 10 MG TABLET    Take 1 tablet (10 mg total) by mouth 2 (two) times daily. Please keep your January appointment for further refills.   SPIRONOLACTONE (ALDACTONE) 25 MG TABLET    Take 1 tablet (25 mg total) by mouth daily. Please keep your January appointment for further refills. Thank you   VENLAFAXINE XR (EFFEXOR-XR) 75 MG 24 HR CAPSULE    TAKE ONE CAPSULE BY MOUTH EVERY MORNING WITH BREAKFAST   VITAMIN B-12 (CYANOCOBALAMIN) 1000 MCG TABLET    Take 1,000 mcg by mouth every morning.  Modified Medications   No medications on file  Discontinued Medications   No medications on file    Physical Exam:  There were no vitals filed for this visit. There is no height or weight on file to calculate BMI. Wt Readings from Last 3 Encounters:  09/02/23 203 lb (92.1 kg)  04/22/23 194 lb 6.4 oz (88.2 kg)  12/20/22 196 lb (88.9 kg)    Physical Exam Constitutional:      Appearance: Normal appearance.  Pulmonary:     Effort: Pulmonary effort is normal.      Comments: Coughing  Neurological:     Mental Status: She is alert. Mental status is at baseline.  Psychiatric:        Mood and Affect: Mood normal.     Labs reviewed: Basic Metabolic Panel: Recent Labs    04/22/23 1135 09/02/23 1037  NA 142 140  K 4.0 4.6  CL 102 100  CO2 31 33*  GLUCOSE 96 99  BUN 20 12  CREATININE 0.88 0.78  CALCIUM 9.5 10.3  TSH 4.63* 2.76   Liver Function Tests: Recent Labs    04/22/23 1135 09/02/23 1037  AST 19 19  ALT 12 11  BILITOT 0.7 0.5  PROT 7.1 7.3   No results for input(s): "LIPASE", "AMYLASE" in the last 8760 hours. No results for input(s): "AMMONIA" in the last 8760 hours. CBC: Recent Labs    04/22/23 1135 09/02/23 1037  WBC 6.9 7.1  NEUTROABS 4,092 4,217  HGB 12.2 12.3  HCT 36.0 37.6  MCV 91.1 94.5  PLT 211 192   Lipid Panel: Recent Labs    04/22/23 1135 09/02/23 1037  CHOL 203* 224*  HDL 74 80  LDLCALC 111* 127*  TRIG 87 80  CHOLHDL 2.7 2.8   TSH: Recent Labs    04/22/23 1135 09/02/23 1037  TSH 4.63* 2.76   A1C: No results found for: "HGBA1C"   Assessment/Plan 1. Upper respiratory tract infection, unspecified type (Primary) Get an over the counter covid and flu test to check to see if you have flu or covid.  Notify if either are positive May use tylenol 325 mg 2 tablets every 8 hours as needed aches and pains or sore throat humidifier in the home to help with the dry air Stop mucinex sinus max start Mucinex DM by mouth twice daily as needed for cough and chest congestion with full glass of water  Keep well hydrated Proper nutrition  Vit c 1000 mg daily - albuterol (VENTOLIN HFA) 108 (90 Base) MCG/ACT inhaler; Inhale 2 puffs into the lungs every 6 (six) hours as needed for wheezing or shortness of breath.  Dispense: 8 g; Refill: 2 Strict follow up precautions given and to go to ED if shortness of breath worsens, increase in fatigue/lethargy occurs, fever  Christina [name] K. Biagio Borg  [facility] [phone]    Virtual Visit via video  I connected with patient on 02/05/24 at  2:20 PM EST by mychart and verified that I am speaking with the correct person using two identifiers.  Location: Patient: home Provider: [facility]   I discussed the limitations, risks, security and privacy concerns of performing an evaluation and management service by telephone and the availability of in person appointments. I also discussed with the patient that there may be a patient responsible charge related to this service. The patient expressed understanding and agreed to proceed.   I discussed the assessment and treatment plan with the patient. The patient was provided an opportunity to ask questions and all were answered. The patient agreed with the plan and demonstrated an understanding of the instructions.   The patient was advised to call back or seek an in-person evaluation if the symptoms worsen or if the condition fails to improve as anticipated.  I provided 15 minutes of non-face-to-face time during this encounter.  Janene Harvey. Biagio Borg Av[facility] printed and mailed

## 2024-02-05 NOTE — Progress Notes (Signed)
   This service is provided via telemedicine  No vital signs collected/recorded due to the encounter was a telemedicine visit.   Location of patient (ex: home, work):  Home  Patient consents to a telephone visit: Yes  Location of the provider (ex: office, home):  [facility], Office   Name of any referring provider:  N/A  Names of all persons participating in the telemedicine service and their role in the encounter:  Ronald Pippins, CMA, Patient, and   Time spent on call:  9 min with medical assistant  Janyth Contes Janene Harvey, NP

## 2024-02-05 NOTE — Patient Instructions (Addendum)
  May use tylenol 325 mg 2 tablets every 8 hours as needed aches and pains or sore throat humidifier in the home to help with the dry air Mucinex DM by mouth twice daily as needed for cough and chest congestion with full glass of water  Keep well hydrated Proper nutrition  Vit c 1000 mg daily  Get an over the counter covid and flu test to check to see if you have flu or covid.  Notify if either is positive

## 2024-02-06 ENCOUNTER — Telehealth: Payer: Self-pay

## 2024-02-06 MED ORDER — NIRMATRELVIR/RITONAVIR (PAXLOVID)TABLET
3.0000 | ORAL_TABLET | Freq: Two times a day (BID) | ORAL | 0 refills | Status: AC
Start: 2024-02-06 — End: 2024-02-11

## 2024-02-06 NOTE — Telephone Encounter (Signed)
Patient states she was told to call back with her covid and flu home test. Patient states she tested positive for covid. Patient states the inhaler eased up the cough and helped her sleep

## 2024-02-06 NOTE — Telephone Encounter (Signed)
Patient is okay with taking that medication

## 2024-02-06 NOTE — Telephone Encounter (Signed)
Can call her in medication if she would like to take paxlovid.

## 2024-02-06 NOTE — Telephone Encounter (Signed)
Patient Notified and agreed. No questions nor concerns.

## 2024-02-06 NOTE — Telephone Encounter (Signed)
Rx sent to the pharmacy. Please notify pt and see if she has any questions for me

## 2024-02-24 ENCOUNTER — Other Ambulatory Visit: Payer: Self-pay | Admitting: Gastroenterology

## 2024-02-24 DIAGNOSIS — I8510 Secondary esophageal varices without bleeding: Secondary | ICD-10-CM

## 2024-02-24 NOTE — Telephone Encounter (Signed)
 Christina [name] has an appointment in early April Sir. May I send in refill?

## 2024-02-24 NOTE — Telephone Encounter (Signed)
 Yes, can refill, thank you PJ.

## 2024-03-02 ENCOUNTER — Ambulatory Visit: Payer: Medicare Other | Admitting: Nurse Practitioner

## 2024-03-16 ENCOUNTER — Ambulatory Visit (INDEPENDENT_AMBULATORY_CARE_PROVIDER_SITE_OTHER): Admitting: Nurse Practitioner

## 2024-03-16 ENCOUNTER — Encounter: Payer: Self-pay | Admitting: Nurse Practitioner

## 2024-03-16 VITALS — BP 138/84 | HR 80 | Temp 97.2°F | Ht <= 58 in | Wt 194.4 lb

## 2024-03-16 DIAGNOSIS — E890 Postprocedural hypothyroidism: Secondary | ICD-10-CM

## 2024-03-16 DIAGNOSIS — K7031 Alcoholic cirrhosis of liver with ascites: Secondary | ICD-10-CM

## 2024-03-16 DIAGNOSIS — E66813 Obesity, class 3: Secondary | ICD-10-CM | POA: Diagnosis not present

## 2024-03-16 DIAGNOSIS — G603 Idiopathic progressive neuropathy: Secondary | ICD-10-CM

## 2024-03-16 DIAGNOSIS — M5441 Lumbago with sciatica, right side: Secondary | ICD-10-CM

## 2024-03-16 DIAGNOSIS — F331 Major depressive disorder, recurrent, moderate: Secondary | ICD-10-CM | POA: Diagnosis not present

## 2024-03-16 DIAGNOSIS — G8929 Other chronic pain: Secondary | ICD-10-CM | POA: Diagnosis not present

## 2024-03-16 DIAGNOSIS — E785 Hyperlipidemia, unspecified: Secondary | ICD-10-CM

## 2024-03-16 DIAGNOSIS — Z6841 Body Mass Index (BMI) 40.0 and over, adult: Secondary | ICD-10-CM

## 2024-03-16 MED ORDER — VENLAFAXINE HCL ER 150 MG PO CP24: 150.0000 mg | ORAL_CAPSULE | Freq: Every day | ORAL | 1 refills | Status: DC

## 2024-03-16 NOTE — Progress Notes (Unsigned)
 Careteam: Patient Care Team: Sharon Seller, NP as PCP - General (Geriatric Medicine) Armbruster, Willaim Rayas, MD as Consulting Physician (Gastroenterology) Karie Soda, MD as Consulting Physician (General Surgery)  PLACE OF SERVICE:  [facility]  Advanced Directive information    No Known Allergies  No chief complaint on file.   HPI: Patient is a 78 y.o. female who comes in today for her routine 6 month follow up of chronic illness. She recently tested positive for COVID 19 in February 2025 and has finished her course of Paxlovid. She still has some fatigue and low energy.   She states she is sleeping around 12 hours each night and waking up with low energy and motivation.   She has lost about 9 lbs since her last visit. Her weight is 194 lbs today, down from 203 lbs in September 2024.   She states her mood is about the same as it was before and that she cannot see any improvement on Effexor. She had spoken to a psychologist via telehealth twice but stopped due to lack of insurance coverage for counseling.   She states she can't get out of her depression, even though she  has many tasks to complete. She is lacking motivation which is one of the primary reasons for her depression. She was prescribed antidepressants after her husband passed away.  For sleep she is taking OTC sleep medicine, Sleep Aid, which she says helps to relax her because she has chronically had problems falling asleep.   She also had all of her teeth removed and is not wearing upper and lower dentures. She is still trying to get used to them, especially for chewing and speaking. She will follow up with oral surgeon in April 2025.  She needs to get a DEXA scan and states she will make the appointment- her last one was completed in September 2020.   She complains of right hip pain, exacerbated by prolonged laying or sitting. This issue started after her fall last year. She uses a cane at baseline to get  around. She mentions that Lyrica is not helping with her nerve pain on both hands.  She is due for routine labs today but is not fasting.   She lives alone but has an older tenant  who lives in one of her rooms.  She gets chronic headaches for which she finds relief with OTC Ibuprofen. When she was younger she used to get migraines.   She denies any vision changes, chest pain, nausea, vomiting, lower extremity swelling, dizziness, constipation, and changes to urination.  She has occasional shortness of breath with walking up the stairs which is a chronic issue for her. She continues to live independently and care for her home, grocery shopping, and cooking with no issues. She does sit down more than usual to rest.  She denies any tobacco use but does drink wine occasionally.   Review of Systems:  Review of Systems  Constitutional:  Positive for malaise/fatigue.  HENT: Negative.    Eyes: Negative.   Respiratory:  Positive for shortness of breath.   Cardiovascular: Negative.   Gastrointestinal: Negative.   Genitourinary: Negative.   Musculoskeletal:  Positive for joint pain and myalgias.  Neurological:  Positive for headaches.  Psychiatric/Behavioral:  Positive for depression.     Past Medical History:  Diagnosis Date   Abnormal EKG    Abnormal finding on thyroid function test    Abnormal liver function test    Acute edema  Alcoholic cirrhosis (HCC) 07/02/2016   Possible NASH overlap (MELD 13)   Anemia    Anxiety    Arthritis    Ascites    Back pain    Breast mass    Chronic low back pain    Complete right rotator cuff tear    Complication of anesthesia    during first surgery- woke up during surgery many years ago   Compression fracture of L4 lumbar vertebra    COVID-19 12/2023   Dermatitis, eczematoid    Difficulty breathing    Gallstones    History of adenocarcinoma of breast    Hypercholesterolemia    Hyperkalemia    Hypertension    Hypokalemia     Hypothyroidism    Insomnia    Knee pain    Leukocytosis    Lymphadenopathy    Macrocytosis    Memory loss or impairment    Menopause    MGUS (monoclonal gammopathy of unknown significance)    Osteoarthritis    Osteoarthritis of right knee    Other cirrhosis of liver (HCC)    Renal insufficiency syndrome    Right knee DJD    Solitary thyroid nodule    Unspecified lump in the left breast, unspecified quadrant    Vitamin B 12 deficiency    Waldenstrom macroglobulinemia    Past Surgical History:  Procedure Laterality Date   BREAST SURGERY  2014   Removed lymphnodes   EYE SURGERY  2006   bilateral cataract   JOINT REPLACEMENT     right  (2017)and left knee   LAPAROSCOPIC CHOLECYSTECTOMY SINGLE SITE WITH INTRAOPERATIVE CHOLANGIOGRAM N/A 02/19/2018   Procedure: LAPAROSCOPIC CHOLECYSTECTOMY;  Surgeon: Karie Soda, MD;  Location: [facility];  Service: General;  Laterality: N/A;   LESION REMOVAL  09/2015   tubular adenoma-4 subcentimeter lesions   LIVER BIOPSY  02/19/2018   Procedure: LIVER BIOPSY;  Surgeon: Karie Soda, MD;  Location: [facility];  Service: General;;   MOUTH SURGERY  10/25/2023   Took all teeth out.   right thumb surgery     SKIN CANCER EXCISION Right    Arm   TONSILLECTOMY     TOTAL KNEE ARTHROPLASTY Right 09/05/2016   UMBILICAL HERNIA REPAIR  02/19/2018   Procedure: REPAIR OF INCARCERATED UMBILICAL HERNIA;  Surgeon: Karie Soda, MD;  Location: [facility];  Service: General;;   Social History:   reports that she has never smoked. She has never used smokeless tobacco. She reports current alcohol use. She reports that she does not use drugs.  Family History  Problem Relation Age of Onset   Breast cancer Mother    Colon cancer Neg Hx    Stomach cancer Neg Hx    Rectal cancer Neg Hx    Liver cancer Neg Hx    Esophageal cancer Neg Hx     Medications: Patient's Medications  New Prescriptions   No medications on file  Previous Medications   ALBUTEROL (VENTOLIN  HFA) 108 (90 BASE) MCG/ACT INHALER    Inhale 2 puffs into the lungs every 6 (six) hours as needed for wheezing or shortness of breath.   CHOLECALCIFEROL (VITAMIN D3) 2000 UNITS TABS    Take 1 tablet by mouth every morning.   FUROSEMIDE (LASIX) 20 MG TABLET    Take 0.5 tablets (10 mg total) by mouth daily.   IBUPROFEN (ADVIL) 200 MG TABLET    Take 200 mg by mouth as needed.   KETOCONAZOLE (NIZORAL) 2 % SHAMPOO    APPLY TO  AFFECTED AREA(S) 2 TIMES PER WEEK   LACTULOSE (CHRONULAC) 10 GM/15ML SOLUTION    TAKE 15 ML'S  BY MOUTH DAILY   LEVOTHYROXINE (SYNTHROID) 100 MCG TABLET    TAKE 1 TABLET BY MOUTH DAILY   LORAZEPAM (ATIVAN) 0.5 MG TABLET    Take 1 tablet (0.5 mg total) by mouth daily as needed for anxiety (prior to dental procedure).   MULTIPLE VITAMIN (MULTIVITAMIN) TABLET    Take 1 tablet by mouth daily.   PREGABALIN (LYRICA) 50 MG CAPSULE    Take 1 capsule (50 mg total) by mouth 2 (two) times daily.   PROPRANOLOL (INDERAL) 10 MG TABLET    Take 1 tablet (10 mg total) by mouth 2 (two) times daily. Please keep your April appointment for further refills. Thank you   SPIRONOLACTONE (ALDACTONE) 25 MG TABLET    Take 1 tablet (25 mg total) by mouth daily. Please keep your January appointment for further refills. Thank you   VITAMIN B-12 (CYANOCOBALAMIN) 1000 MCG TABLET    Take 1,000 mcg by mouth every morning.  Modified Medications   Modified Medication Previous Medication   VENLAFAXINE XR (EFFEXOR-XR) 150 MG 24 HR CAPSULE venlafaxine XR (EFFEXOR-XR) 75 MG 24 hr capsule      Take 1 capsule (150 mg total) by mouth daily with breakfast.    TAKE ONE CAPSULE BY MOUTH EVERY MORNING WITH BREAKFAST  Discontinued Medications   No medications on file    Physical Exam:  Vitals:   03/16/24 1237  BP: 138/84  Pulse: 80  Temp: (!) 97.2 F (36.2 C)  SpO2: 98%  Weight: 194 lb 6.4 oz (88.2 kg)  Height: 4\' 10"  (1.473 m)   Body mass index is 40.63 kg/m. Wt Readings from Last 3 Encounters:  03/16/24 194 lb  6.4 oz (88.2 kg)  09/02/23 203 lb (92.1 kg)  04/22/23 194 lb 6.4 oz (88.2 kg)    Physical Exam Constitutional:      Appearance: Normal appearance. She is obese.  HENT:     Head: Normocephalic and atraumatic.     Right Ear: External ear normal.     Left Ear: External ear normal.     Nose: Nose normal.     Mouth/Throat:     Mouth: Mucous membranes are moist.     Pharynx: Oropharynx is clear.  Eyes:     Conjunctiva/sclera: Conjunctivae normal.  Cardiovascular:     Rate and Rhythm: Normal rate and regular rhythm.     Pulses: Normal pulses.     Heart sounds: Normal heart sounds.  Pulmonary:     Effort: Pulmonary effort is normal.     Breath sounds: Normal breath sounds.  Abdominal:     General: Bowel sounds are normal.     Palpations: Abdomen is soft.  Musculoskeletal:        General: Tenderness (right hip/right lower back) present.     Right hip: Tenderness present. Decreased range of motion. Decreased strength.  Skin:    General: Skin is warm and dry.     Comments: Dry and flaky skin, bilateral arms and legs  Neurological:     Mental Status: She is alert and oriented to person, place, and time. Mental status is at baseline.     Motor: Weakness present.  Psychiatric:        Attention and Perception: Attention and perception normal.        Mood and Affect: Mood is depressed.        Speech: Speech normal.  Behavior: Behavior normal. Behavior is cooperative.        Thought Content: Thought content normal.        Cognition and Memory: Cognition normal.        Judgment: Judgment normal.     Labs reviewed: Basic Metabolic Panel: Recent Labs    04/22/23 1135 09/02/23 1037  NA 142 140  K 4.0 4.6  CL 102 100  CO2 31 33*  GLUCOSE 96 99  BUN 20 12  CREATININE 0.88 0.78  CALCIUM 9.5 10.3  TSH 4.63* 2.76   Liver Function Tests: Recent Labs    04/22/23 1135 09/02/23 1037  AST 19 19  ALT 12 11  BILITOT 0.7 0.5  PROT 7.1 7.3   No results for input(s):  "LIPASE", "AMYLASE" in the last 8760 hours. No results for input(s): "AMMONIA" in the last 8760 hours. CBC: Recent Labs    04/22/23 1135 09/02/23 1037  WBC 6.9 7.1  NEUTROABS 4,092 4,217  HGB 12.2 12.3  HCT 36.0 37.6  MCV 91.1 94.5  PLT 211 192   Lipid Panel: Recent Labs    04/22/23 1135 09/02/23 1037  CHOL 203* 224*  HDL 74 80  LDLCALC 111* 127*  TRIG 87 80  CHOLHDL 2.7 2.8   TSH: Recent Labs    04/22/23 1135 09/02/23 1037  TSH 4.63* 2.76   A1C: No results found for: "HGBA1C"  Assessment/Plan  1. Hyperlipidemia, unspecified hyperlipidemia type (Primary)  - Lipid panel ordered to be collected  - Lipid panel from September 2024 showed elevated cholesterol 224 and elevated LDL 127  - COMPLETE METABOLIC PANEL WITH GFR ordered to be collected in clinic today - CBC with Differential/Platelet ordered to be collected in clinic today   2. Alcoholic cirrhosis of liver with ascites (HCC)  - COMPLETE METABOLIC PANEL WITH GFR ordered to be collected in clinic today - CBC with Differential/Platelet ordered to be collected in clinic today - Discussed complete cessation of alcohol use due to liver function - Followed by Gastroenterology   3. Class 3 severe obesity due to excess calories without serious comorbidity with body mass index (BMI) of 40.0 to 44.9 in adult [facility])  - BMI 40.6 today, she has lost 9 lbs since last visit in September 2024 - Discussed dietary modifications and importance of moderate exercise  4. Depression, major, recurrent, moderate (HCC)  - Increase Effexor from 75 mg capsule daily to 150 mg capsule daily - venlafaxine XR (EFFEXOR-XR) 150 MG 24 hr capsule; Take 1 capsule (150 mg total) by mouth daily with breakfast.  Dispense: 90 capsule; Refill: 1 - Discussed calling insurance company to look at counseling options that are more affordable and covered by insurance - Discussed lifestyle modifications for mental health including journaling, getting  frequent fresh air, and increasing physical activity  5. Postoperative hypothyroidism  - TSH from September 2024 normal - Continue Levothyroxine 100 mcg tablet daily as prescribed  6. Chronic right-sided low back pain with right-sided sciatica  - DG Lumbar Spine Complete ordered to be performed - Ambulatory referral to Physical Therapy ordered for pain relief - Continue OTC Ibuprofen as needed for pain relief - Continue Lyrica 50 mg capsule BID as prescribed for pain relief   7. Idiopathic progressive neuropathy  - Stable, continue Lyrica 50 mg capsule BID as prescribed for pain relief    Return in 6 months for routine follow up for management of chronic illnesses.  Lenord Fellers, RN DNP-AGPCNP Student *** Janene Harvey. [facility] [phone]

## 2024-03-16 NOTE — Patient Instructions (Addendum)
 Will increase effexor to 150 mg daily Journal daily Make sure to get outside for 30 mins every days.  Try to exercise 30 mins/5 days.    Also make 6 month follow up on chronic conditions

## 2024-03-17 ENCOUNTER — Encounter: Payer: Self-pay | Admitting: Nurse Practitioner

## 2024-03-17 LAB — LIPID PANEL
Cholesterol: 218 mg/dL — ABNORMAL HIGH (ref <200)
HDL: 77 mg/dL (ref >=50)
LDL Cholesterol (Calc): 120 mg/dL — ABNORMAL HIGH
Non-HDL Cholesterol (Calc): 141 mg/dL — ABNORMAL HIGH (ref <130)
Total CHOL/HDL Ratio: 2.8 (calc) (ref <5.0)
Triglycerides: 103 mg/dL (ref <150)

## 2024-03-17 LAB — CBC WITH DIFFERENTIAL/PLATELET
Absolute Lymphocytes: 1771 {cells}/uL (ref 850–3900)
Absolute Monocytes: 763 {cells}/uL (ref 200–950)
Basophils Absolute: 72 {cells}/uL (ref 0–200)
Basophils Relative: 1 %
Eosinophils Absolute: 122 {cells}/uL (ref 15–500)
Eosinophils Relative: 1.7 %
HCT: 39.2 % (ref 35.0–45.0)
Hemoglobin: 13 g/dL (ref 11.7–15.5)
MCH: 31 pg (ref 27.0–33.0)
MCHC: 33.2 g/dL (ref 32.0–36.0)
MCV: 93.6 fL (ref 80.0–100.0)
MPV: 10.2 fL (ref 7.5–12.5)
Monocytes Relative: 10.6 %
Neutro Abs: 4471 {cells}/uL (ref 1500–7800)
Neutrophils Relative %: 62.1 %
Platelets: 243 10*3/uL (ref 140–400)
RBC: 4.19 10*6/uL (ref 3.80–5.10)
RDW: 12 % (ref 11.0–15.0)
Total Lymphocyte: 24.6 %
WBC: 7.2 10*3/uL (ref 3.8–10.8)

## 2024-03-17 LAB — COMPLETE METABOLIC PANEL WITH GFR
AG Ratio: 1.1 (calc) (ref 1.0–2.5)
ALT: 12 U/L (ref 6–29)
AST: 22 U/L (ref 10–35)
Albumin: 4 g/dL (ref 3.6–5.1)
Alkaline phosphatase (APISO): 59 U/L (ref 37–153)
BUN: 14 mg/dL (ref 7–25)
CO2: 30 mmol/L (ref 20–32)
Calcium: 10 mg/dL (ref 8.6–10.4)
Chloride: 99 mmol/L (ref 98–110)
Creat: 0.72 mg/dL (ref 0.60–1.00)
Globulin: 3.5 g/dL (ref 1.9–3.7)
Glucose, Bld: 77 mg/dL (ref 65–99)
Potassium: 4.6 mmol/L (ref 3.5–5.3)
Sodium: 137 mmol/L (ref 135–146)
Total Bilirubin: 0.6 mg/dL (ref 0.2–1.2)
Total Protein: 7.5 g/dL (ref 6.1–8.1)

## 2024-03-31 ENCOUNTER — Encounter: Payer: Self-pay | Admitting: Gastroenterology

## 2024-03-31 ENCOUNTER — Ambulatory Visit: Payer: Medicare Other | Admitting: Gastroenterology

## 2024-03-31 ENCOUNTER — Other Ambulatory Visit (INDEPENDENT_AMBULATORY_CARE_PROVIDER_SITE_OTHER)

## 2024-03-31 VITALS — BP 122/64 | HR 70 | Ht <= 58 in | Wt 195.5 lb

## 2024-03-31 DIAGNOSIS — F1090 Alcohol use, unspecified, uncomplicated: Secondary | ICD-10-CM | POA: Diagnosis not present

## 2024-03-31 DIAGNOSIS — K7030 Alcoholic cirrhosis of liver without ascites: Secondary | ICD-10-CM | POA: Diagnosis not present

## 2024-03-31 DIAGNOSIS — I8500 Esophageal varices without bleeding: Secondary | ICD-10-CM | POA: Diagnosis not present

## 2024-03-31 DIAGNOSIS — K7290 Hepatic failure, unspecified without coma: Secondary | ICD-10-CM

## 2024-03-31 DIAGNOSIS — K7682 Hepatic encephalopathy: Secondary | ICD-10-CM

## 2024-03-31 DIAGNOSIS — I8510 Secondary esophageal varices without bleeding: Secondary | ICD-10-CM | POA: Diagnosis not present

## 2024-03-31 DIAGNOSIS — K7460 Unspecified cirrhosis of liver: Secondary | ICD-10-CM

## 2024-03-31 DIAGNOSIS — R1310 Dysphagia, unspecified: Secondary | ICD-10-CM | POA: Diagnosis not present

## 2024-03-31 LAB — PROTIME-INR
INR: 1.1 ratio — ABNORMAL HIGH (ref 0.8–1.0)
Prothrombin Time: 11.5 s (ref 9.6–13.1)

## 2024-03-31 MED ORDER — FUROSEMIDE 20 MG PO TABS: 10.0000 mg | ORAL_TABLET | Freq: Every day | ORAL | 3 refills | Status: AC

## 2024-03-31 MED ORDER — PROPRANOLOL HCL 10 MG PO TABS
10.0000 mg | ORAL_TABLET | Freq: Two times a day (BID) | ORAL | 3 refills | Status: AC
Start: 2024-03-31 — End: ?

## 2024-03-31 MED ORDER — SPIRONOLACTONE 25 MG PO TABS: 25.0000 mg | ORAL_TABLET | Freq: Every day | ORAL | 3 refills | Status: AC

## 2024-03-31 MED ORDER — LACTULOSE 10 GM/15ML PO SOLN: 10.0000 g | Freq: Every day | ORAL | 3 refills | Status: AC

## 2024-03-31 NOTE — Progress Notes (Signed)
 HPI :  78 year old female here for follow-up visit for cirrhosis, thought to be related to alcohol.  Recall she was diagnosed with cirrhosis many years ago when she lived in [place], in the setting of heavy alcohol use when caring for her husband who had dementia at the time.  She had a liver biopsy done at the time of cholecystectomy per Dr. Michaell Cowing which showed cirrhosis and Mallory bodies on the biopsy with a negative serologic workup.  She has clinically been compensated and doing well for the past several years.  She does have a history of small esophageal varices on EGD in the past but has been maintained on propranolol so we have not repeated that.  She remains on low-dose Aldactone and Lasix for fluid retention.  She has had a history of mild hepatic encephalopathy in the past controlled with lactulose.  I have not seen her for the past 2 years.  She denies any problems with her health in general that bother her.  She had oral surgery and significant amount of teeth pulled in recent months.  She has new dentures and is getting used to that.  She has sense of dysphagia when swallowing solids, states is because she cannot chew very well.  She has no dysphagia with liquids.  Her prior EGD showed no evidence of a stenosis or stricture.  Otherwise she states diuretics are working to control edema.  No ascites.  She takes lactulose perhaps every other day because taking it daily causes a significant mount of loose stools which she does not tolerate.  She is compliant with propranolol and takes that routinely as well as her diuretics.  She has no complaints today.  She has not been screened with an ultrasound for the past 2 years or so, we discussed the importance of that.  I did ask her about her alcohol use and she states she has an occasional wine a few times per week.  We reviewed risks of alcohol use.    Prior endoscopic workup  Colonoscopy - 10/13/2015 - Dr. Lennart Pall at [facility]. - exam was a poor prep which was aborted in the left colon, told to repeat with 2 day prep.  Colonoscopy - 10/20/2015 - Dr. Lennart Pall - 3 ascending polyps 5-5mm in size, one small sigmoid polyp removed, diverticulosis - fair bowel prep - recommended 3 year follow up Colonoscopy 09/10/2018 - 8 small polyps - largest 8mm in size, diverticulosis, hemorrhoids - Repeat 3 years   2016 - EGD - small esophageal varices, mild PHG    MRI liver 09/11/19 - IMPRESSION: 1. Hepatic cirrhosis with fibrosis and architectural distortion. No liver mass identified 2. Splenorenal shunting indicating portal venous hypertension. 3. Descending and sigmoid colon diverticulosis. 4.  Aortic Atherosclerosis (ICD10-I70.0).      US abdomen 03/17/21 - stable cirrhosis changes     RUQ [place] 04/03/22: IMPRESSION: 1. Cirrhotic changes in the liver.  No liver mass identified. 2. No other significant abnormalities.     Colonoscopy 11/2022: - The perianal and digital rectal examinations were normal. - Many medium-mouthed diverticula were found in the entire colon. - A 2 to 3 mm polyp was found in the transverse colon. The polyp was sessile. The polyp was removed with a cold snare. Resection and retrieval were complete. - A 4 mm polyp was found in the sigmoid colon. The polyp was sessile. The polyp was removed with a cold snare. Resection and retrieval were complete. - Internal hemorrhoids were  found during retroflexion. The hemorrhoids were small. - The exam was otherwise without abnormality.  Surgical [P], colon, sigmoid and transverse, polyp (2) - TUBULAR ADENOMA WITHOUT HIGH-GRADE DYSPLASIA OR MALIGNANCY - HYPERPLASTIC POLYP - MULTIPLE STEP SECTIONS WERE EXAMINED   RUQ [place] 03/2022: IMPRESSION: 1. Cirrhotic changes in the liver.  No liver mass identified. 2. No other significant abnormalities.        Past Medical History:  Diagnosis Date   Abnormal EKG    Abnormal finding on thyroid function test    Abnormal  liver function test    Acute edema    Alcoholic cirrhosis (HCC) 07/02/2016   Possible NASH overlap (MELD 13)   Anemia    Anxiety    Arthritis    Ascites    Back pain    Breast mass    Chronic low back pain    Complete right rotator cuff tear    Complication of anesthesia    during first surgery- woke up during surgery many years ago   Compression fracture of L4 lumbar vertebra    COVID-19 12/2023   Dermatitis, eczematoid    Difficulty breathing    Gallstones    History of adenocarcinoma of breast    Hypercholesterolemia    Hyperkalemia    Hypertension    Hypokalemia    Hypothyroidism    Insomnia    Knee pain    Leukocytosis    Lymphadenopathy    Macrocytosis    Memory loss or impairment    Menopause    MGUS (monoclonal gammopathy of unknown significance)    Osteoarthritis    Osteoarthritis of right knee    Other cirrhosis of liver (HCC)    Renal insufficiency syndrome    Right knee DJD    Solitary thyroid nodule    Unspecified lump in the left breast, unspecified quadrant    Vitamin B 12 deficiency    Waldenstrom macroglobulinemia      Past Surgical History:  Procedure Laterality Date   BREAST SURGERY  2014   Removed lymphnodes   EYE SURGERY  2006   bilateral cataract   JOINT REPLACEMENT     right  (2017)and left knee   LAPAROSCOPIC CHOLECYSTECTOMY SINGLE SITE WITH INTRAOPERATIVE CHOLANGIOGRAM N/A 02/19/2018   Procedure: LAPAROSCOPIC CHOLECYSTECTOMY;  Surgeon: Karie Soda, MD;  Location: [facility];  Service: General;  Laterality: N/A;   LESION REMOVAL  09/2015   tubular adenoma-4 subcentimeter lesions   LIVER BIOPSY  02/19/2018   Procedure: LIVER BIOPSY;  Surgeon: Karie Soda, MD;  Location: [facility];  Service: General;;   MOUTH SURGERY  10/25/2023   Took all teeth out.   right thumb surgery     SKIN CANCER EXCISION Right    Arm   TONSILLECTOMY     TOTAL KNEE ARTHROPLASTY Right 09/05/2016   UMBILICAL HERNIA REPAIR  02/19/2018   Procedure: REPAIR OF  INCARCERATED UMBILICAL HERNIA;  Surgeon: Karie Soda, MD;  Location: [facility];  Service: General;;   Family History  Problem Relation Age of Onset   Breast cancer Mother    Colon cancer Neg Hx    Stomach cancer Neg Hx    Rectal cancer Neg Hx    Liver cancer Neg Hx    Esophageal cancer Neg Hx    Social History   Tobacco Use   Smoking status: Never   Smokeless tobacco: Never  Vaping Use   Vaping status: Never Used  Substance Use Topics   Alcohol use: Yes    Comment:  wine now and then per patient   Drug use: No   Current Outpatient Medications  Medication Sig Dispense Refill   albuterol (VENTOLIN HFA) 108 (90 Base) MCG/ACT inhaler Inhale 2 puffs into the lungs every 6 (six) hours as needed for wheezing or shortness of breath. 8 g 2   Cholecalciferol (VITAMIN D3) 2000 units TABS Take 1 tablet by mouth every morning.     furosemide (LASIX) 20 MG tablet Take 0.5 tablets (10 mg total) by mouth daily. 45 tablet 3   ibuprofen (ADVIL) 200 MG tablet Take 200 mg by mouth as needed.     ketoconazole (NIZORAL) 2 % shampoo APPLY TO AFFECTED AREA(S) 2 TIMES PER WEEK 120 mL 2   lactulose (CHRONULAC) 10 GM/15ML solution TAKE 15 ML'S  BY MOUTH DAILY (Patient taking differently: every other day.) 1350 mL 1   levothyroxine (SYNTHROID) 100 MCG tablet TAKE 1 TABLET BY MOUTH DAILY 90 tablet 3   LORazepam (ATIVAN) 0.5 MG tablet Take 1 tablet (0.5 mg total) by mouth daily as needed for anxiety (prior to dental procedure). 2 tablet 0   Multiple Vitamin (MULTIVITAMIN) tablet Take 1 tablet by mouth daily.     pregabalin (LYRICA) 50 MG capsule Take 1 capsule (50 mg total) by mouth 2 (two) times daily. 180 capsule 3   propranolol (INDERAL) 10 MG tablet Take 1 tablet (10 mg total) by mouth 2 (two) times daily. Please keep your April appointment for further refills. Thank you 60 tablet 2   spironolactone (ALDACTONE) 25 MG tablet Take 1 tablet (25 mg total) by mouth daily. Please keep your January appointment for  further refills. Thank you 30 tablet 0   venlafaxine XR (EFFEXOR-XR) 150 MG 24 hr capsule Take 1 capsule (150 mg total) by mouth daily with breakfast. 90 capsule 1   vitamin B-12 (CYANOCOBALAMIN) 1000 MCG tablet Take 1,000 mcg by mouth every morning.     No current facility-administered medications for this visit.   No Known Allergies   Review of Systems: All systems reviewed and negative except where noted in HPI.   Lab Results  Component Value Date   WBC 7.2 03/16/2024   HGB 13.0 03/16/2024   HCT 39.2 03/16/2024   MCV 93.6 03/16/2024   PLT 243 03/16/2024    Lab Results  Component Value Date   NA 137 03/16/2024   CL 99 03/16/2024   K 4.6 03/16/2024   CO2 30 03/16/2024   BUN 14 03/16/2024   CREATININE 0.72 03/16/2024   EGFR 78 09/02/2023   CALCIUM 10.0 03/16/2024   ALBUMIN 3.8 10/24/2022   GLUCOSE 77 03/16/2024    Lab Results  Component Value Date   ALT 12 03/16/2024   AST 22 03/16/2024   ALKPHOS 54 10/24/2022   BILITOT 0.6 03/16/2024     Physical Exam: BP 122/64   Pulse 70   Ht 4\' 10"  (1.473 m)   Wt 195 lb 8 oz (88.7 kg)   SpO2 95%   BMI 40.86 kg/m  Constitutional: Pleasant,well-developed, female in no acute distress. Neurological: Alert and oriented to person place and time. Psychiatric: Normal mood and affect. Behavior is normal.   ASSESSMENT: 78 y.o. female here for assessment of the following  1. Cirrhosis of liver without ascites, unspecified hepatic cirrhosis type (HCC)   2. Esophageal varices in alcoholic cirrhosis (HCC)   3. Hepatic encephalopathy (HCC)   4. Alcohol use    She has done really well over the past few years without any hospitalizations or  progression of her liver disease that we are aware of.  Her platelet count is normal.  We did discuss risks over time for decompensation.  She has a history of mild encephalopathy, on lactulose, which is well-controlled.  She also has history of small varices, tolerating propranolol, on lower  dose I believe due to intolerance of higher dosing in the past.  She takes low-dose diuretics daily which keep her edema at bay.  I reviewed her alcohol use with her, I do recommend she completely abstain from drinking alcohol to help reduce her risk for decompensation moving forward.  She understands this.  I counseled her on risks for [facility] and recommend continued screening with ultrasound every 6 months.  She is due for that now and will refer her.  She is otherwise due for INR as well as AFP today, her bilirubin and LFTs were recently checked and looked good.  She should continue to see me every 6 months for reassessment.  Otherwise her dysphagia I suspect is due to her new dentures and she states she is struggling to chew well with them in place.  She wants to work on this right now.  I offered her a barium swallow to ensure no stenosis/mucosal abnormalities however her last exam did not show anything concerning.  I would be hesitant to do empiric dilation for her with history of varices.  If she finds her dysphagia persists or progresses over time we can do a barium swallow, she will keep me posted.  She declines that for now.   PLAN: - RUQ [place] for HCC screening - lab today - INR and AFP - abstain from alcohol - refill lactulose - refill propranolol - refill lasix - refill aldactone - f/u 6 months with repeat US and labs - dysphagia likely due to new dentures and reduced ability with chewing. Discussed barium swallow she declines for now, will contact me if she wishes to pursue it  Harlin Rain, MD [facility]enter[facility]

## 2024-03-31 NOTE — Patient Instructions (Signed)
 Abstain from alcohol.  We have sent the following medications to your pharmacy for you to pick up at your convenience: Aldactone, Lasix, Propranolol, and Lactulose  You have been scheduled for an abdominal ultrasound at [facility] (1st floor of hospital) on Friday, 04-03-24 at 11:00am. Please arrive 30 minutes prior to your appointment for registration. Make certain not to have anything to eat or drink 6 hours prior to your appointment. Should you need to reschedule your appointment, please contact radiology at [phone]. This test typically takes about 30 minutes to perform.  Please go to the lab in the basement of our building to have lab work done as you leave today. Hit "B" for basement when you get on the elevator.  When the doors open the lab is on your left.  We will call you with the results. Thank you.  Please follow up in 6 months.  Thank you for entrusting me with your care and for choosing [facility], Dr. Ileene Patrick    If your blood pressure at your visit was 140/90 or greater, please contact your primary care physician to follow up on this. ______________________________________________________  If you are age 74 or older, your body mass index should be between 23-30. Your Body mass index is 40.86 kg/m. If this is out of the aforementioned range listed, please consider follow up with your Primary Care Provider.  If you are age 64 or younger, your body mass index should be between 19-25. Your Body mass index is 40.86 kg/m. If this is out of the aformentioned range listed, please consider follow up with your Primary Care Provider.  ________________________________________________________  The [facility] providers would like to encourage you to use [facility] to communicate with providers for non-urgent requests or questions.  Due to long hold times on the telephone, sending your provider a message by [facility] may be a faster and more efficient way to get a  response.  Please allow 48 business hours for a response.  Please remember that this is for non-urgent requests.  _______________________________________________________  Due to recent changes in healthcare laws, you may see the results of your imaging and laboratory studies on MyChart before your provider has had a chance to review them.  We understand that in some cases there may be results that are confusing or concerning to you. Not all laboratory results come back in the same time frame and the provider may be waiting for multiple results in order to interpret others.  Please give [place] 48 hours in order for your provider to thoroughly review all the results before contacting the office for clarification of your results.

## 2024-04-01 ENCOUNTER — Encounter: Payer: Self-pay | Admitting: Nurse Practitioner

## 2024-04-01 LAB — AFP TUMOR MARKER: AFP-Tumor Marker: 1.9 ng/mL

## 2024-04-03 ENCOUNTER — Ambulatory Visit (HOSPITAL_COMMUNITY)
Admission: RE | Admit: 2024-04-03 | Discharge: 2024-04-03 | Disposition: A | Discharge status: Home / Self Care | Source: Ambulatory Visit | Attending: Gastroenterology | Admitting: Gastroenterology

## 2024-04-03 ENCOUNTER — Other Ambulatory Visit: Payer: Self-pay

## 2024-04-03 ENCOUNTER — Encounter: Payer: Self-pay | Admitting: Gastroenterology

## 2024-04-03 DIAGNOSIS — I8510 Secondary esophageal varices without bleeding: Secondary | ICD-10-CM | POA: Insufficient documentation

## 2024-04-03 DIAGNOSIS — K7682 Hepatic encephalopathy: Secondary | ICD-10-CM | POA: Insufficient documentation

## 2024-04-03 DIAGNOSIS — K7460 Unspecified cirrhosis of liver: Secondary | ICD-10-CM

## 2024-04-03 DIAGNOSIS — F1090 Alcohol use, unspecified, uncomplicated: Secondary | ICD-10-CM | POA: Insufficient documentation

## 2024-04-03 DIAGNOSIS — R1310 Dysphagia, unspecified: Secondary | ICD-10-CM | POA: Diagnosis not present

## 2024-04-03 DIAGNOSIS — Z9049 Acquired absence of other specified parts of digestive tract: Secondary | ICD-10-CM | POA: Diagnosis not present

## 2024-04-03 DIAGNOSIS — K7030 Alcoholic cirrhosis of liver without ascites: Secondary | ICD-10-CM | POA: Insufficient documentation

## 2024-04-13 ENCOUNTER — Ambulatory Visit: Admitting: Nurse Practitioner

## 2024-06-04 ENCOUNTER — Other Ambulatory Visit: Payer: Self-pay | Admitting: Nurse Practitioner

## 2024-06-04 DIAGNOSIS — F331 Major depressive disorder, recurrent, moderate: Secondary | ICD-10-CM

## 2024-06-05 ENCOUNTER — Ambulatory Visit: Payer: Self-pay

## 2024-06-05 NOTE — Telephone Encounter (Signed)
 MyChart message sent to patient.

## 2024-06-05 NOTE — Telephone Encounter (Signed)
 Message routed to PCP Roselie Conger, [place] Com[place], NP Appointment offered, appointment declined by patient. She would like provider to call in medication for the symptoms.

## 2024-06-05 NOTE — Telephone Encounter (Signed)
 FYI Only or Action Required?: Action required by provider  Patient was last seen in primary care on 03/16/2024 by Verma Gobble, NP. Called Nurse Triage reporting Eye Drainage. Symptoms began 2 days ago. Interventions attempted: Nothing. Symptoms are: unchanged.  Triage Disposition: Call PCP Within 24 Hours  Patient/caregiver understands and will follow disposition?: No, wishes to speak with PCP            Copied from CRM [id number]. Topic: Clinical - Medical Advice >> Jun 05, 2024 10:29 AM Adrianna P wrote: Reason for CRM: Patient believes she has an eye infection, she has a yellow/ green  discharge coming from her eye, for 2 days now, and would like some eye drops called in from her. Can be reached at [phone] Reason for Disposition  [1] Eye with yellow or green discharge, or eyelashes stick together AND [2] NO PCP standing order to call in antibiotic eye drops   (Exception: [place]; continue triage.)  Answer Assessment - Initial Assessment Questions 1. EYE DISCHARGE: Is the discharge in one or both eyes? What color is it? How much is there? When did the discharge start?      Symptoms began x2 days ago, suspected pollen but symptoms progressed. 2. REDNESS OF SCLERA: Is the redness in one or both eyes? When did the redness start?      Mild, in Left eye 3. EYELIDS: Are the eyelids red or swollen? If Yes, ask: How much?      No  4. VISION: Is there any difficulty seeing clearly?      Cloudiness in left eye, now resolved.  5. PAIN: Is there any pain? If Yes, ask: How bad is it? (Scale 1-10; or mild, moderate, severe)    - MILD (1-3): doesn't interfere with normal activities     - MODERATE (4-7): interferes with normal activities or awakens from sleep    - SEVERE (8-10): excruciating pain, unable to do any normal activities       No, mild discomfort  6. CONTACT LENS: Do you wear contacts?     No 7. OTHER SYMPTOMS: Do you have any other symptoms?  (e.g., fever, runny nose, cough)     No   Appointment offered, appointment declined by patient. She would like provider to call in medication for the symptoms.  Protocols used: Eye - Pus or Discharge-A-AH

## 2024-06-05 NOTE — Telephone Encounter (Signed)
 She will need a visit prior to medication being called in, she can request a virtual visit though cone if needed

## 2024-06-23 ENCOUNTER — Encounter: Payer: Medicare Other | Admitting: Nurse Practitioner

## 2024-06-23 NOTE — Progress Notes (Signed)
 This encounter was created in error - please disregard.

## 2024-07-31 DIAGNOSIS — H04123 Dry eye syndrome of bilateral lacrimal glands: Secondary | ICD-10-CM | POA: Diagnosis not present

## 2024-07-31 DIAGNOSIS — H524 Presbyopia: Secondary | ICD-10-CM | POA: Diagnosis not present

## 2024-07-31 DIAGNOSIS — Z961 Presence of intraocular lens: Secondary | ICD-10-CM | POA: Diagnosis not present

## 2024-08-14 ENCOUNTER — Encounter: Payer: Self-pay | Admitting: Gastroenterology

## 2024-08-18 DIAGNOSIS — C44729 Squamous cell carcinoma of skin of left lower limb, including hip: Secondary | ICD-10-CM | POA: Diagnosis not present

## 2024-08-18 DIAGNOSIS — D4819 Other specified neoplasm of uncertain behavior of connective and other soft tissue: Secondary | ICD-10-CM | POA: Diagnosis not present

## 2024-08-18 DIAGNOSIS — Z85828 Personal history of other malignant neoplasm of skin: Secondary | ICD-10-CM | POA: Diagnosis not present

## 2024-09-09 ENCOUNTER — Telehealth: Payer: Self-pay

## 2024-09-09 NOTE — Telephone Encounter (Signed)
 Patient due for RUQ in mid October. Order is in. Called and scheduled appointment at [facility] for October 10th, Friday at 10:00am to arr at 9:45am. NPO after midnight.  Letter mailed to patient and MyChart message sent to patient.

## 2024-09-09 NOTE — Telephone Encounter (Signed)
-----   Message from [facility] Boone W sent at 04/03/2024  1:36 PM EDT ----- RUQ u/s for [facility] screening due mid October

## 2024-09-21 ENCOUNTER — Ambulatory Visit: Admitting: Nurse Practitioner

## 2024-09-24 ENCOUNTER — Telehealth: Payer: Self-pay

## 2024-09-24 DIAGNOSIS — I8500 Esophageal varices without bleeding: Secondary | ICD-10-CM

## 2024-09-24 DIAGNOSIS — K7682 Hepatic encephalopathy: Secondary | ICD-10-CM

## 2024-09-24 DIAGNOSIS — K7460 Unspecified cirrhosis of liver: Secondary | ICD-10-CM

## 2024-09-24 NOTE — Telephone Encounter (Signed)
-----   Message from [facility] Clarita H sent at 04/02/2024  2:13 PM EDT ----- Regarding: labs due in October CBC, CMET, AFP, INR due in October

## 2024-09-24 NOTE — Telephone Encounter (Signed)
 Labs entered. MyChart to patient to go to the lab

## 2024-10-02 ENCOUNTER — Ambulatory Visit (HOSPITAL_COMMUNITY)

## 2024-11-06 ENCOUNTER — Ambulatory Visit (HOSPITAL_COMMUNITY)

## 2024-11-09 ENCOUNTER — Ambulatory Visit: Payer: Self-pay | Admitting: Nurse Practitioner

## 2024-12-21 ENCOUNTER — Other Ambulatory Visit: Payer: Self-pay | Admitting: Nurse Practitioner

## 2024-12-21 DIAGNOSIS — F331 Major depressive disorder, recurrent, moderate: Secondary | ICD-10-CM

## 2024-12-22 ENCOUNTER — Ambulatory Visit: Payer: Self-pay

## 2024-12-22 NOTE — Telephone Encounter (Signed)
 See triage notes from triage nurse as FYI

## 2024-12-22 NOTE — Telephone Encounter (Signed)
 FYI Only or Action Required?: FYI only for provider: ED advised.  Patient was last seen in primary care on 03/16/2024 by Caro Harlene POUR, NP.  Called Nurse Triage reporting Cough.  Symptoms began several days ago.  Interventions attempted: Nothing.  Symptoms are: unchanged.  Triage Disposition: Go to ED Now (Notify PCP)  Patient/caregiver understands and will follow disposition?: Yes   Copied from CRM [id number]. Topic: Clinical - Red Word Triage >> Dec 22, 2024  9:30 AM Susanna ORN wrote: Red Word that prompted transfer to Nurse Triage: Patient has congestion & is wheezing badly. Also has a horrible cough. Wants to be seen today, if possible. Reason for Disposition  [1] MODERATE difficulty breathing (e.g., speaks in phrases, SOB even at rest, pulse 100-120) AND [2] still present when not coughing  Answer Assessment - Initial Assessment Questions Advised ED now. Patient reports will call someone to take to ED. Nurse offered to call 911, patient declined.   Advised 911 if symptoms worsen. Patient verbalized understanding.  1. ONSET: When did the cough begin?      Sunday 2. SEVERITY: How bad is the cough today?      severe 3. SPUTUM: Describe the color of your sputum (e.g., none, dry cough; clear, white, yellow, green)     no 4. HEMOPTYSIS: Are you coughing up any blood? If Yes, ask: How much? (e.g., flecks, streaks, tablespoons, etc.)     no 5. DIFFICULTY BREATHING: Are you having difficulty breathing? If Yes, ask: How bad is it? (e.g., mild, moderate, severe)      Unable to answer, I don't know how to tell the difference, I can't talk without coughing. Nurse instructed patient to use inhaler, patient unsure where medication is at. 6. FEVER: Do you have a fever? If Yes, ask: What is your temperature, how was it measured, and when did it start?     Denies fever, chills, n/v 7. CARDIAC HISTORY: Do you have any history of heart disease? (e.g., heart attack,  congestive heart failure)      no 8. LUNG HISTORY: Do you have any history of lung disease?  (e.g., pulmonary embolus, asthma, emphysema)     Hx bronchitis 9. PE RISK FACTORS: Do you have a history of blood clots? (or: recent major surgery, recent prolonged travel, bedridden)     no 10. OTHER SYMPTOMS: Do you have any other symptoms? (e.g., runny nose, wheezing, chest pain) Wheezing, chest congestion, weakness Denies runny nose, chest pain  Protocols used: Cough - Acute Non-Productive-A-AH

## 2024-12-25 ENCOUNTER — Ambulatory Visit: Admitting: Nurse Practitioner

## 2024-12-27 ENCOUNTER — Other Ambulatory Visit: Payer: Self-pay | Admitting: Nurse Practitioner

## 2024-12-27 DIAGNOSIS — E890 Postprocedural hypothyroidism: Secondary | ICD-10-CM

## 2024-12-28 ENCOUNTER — Other Ambulatory Visit: Payer: Self-pay | Admitting: Nurse Practitioner

## 2024-12-28 ENCOUNTER — Other Ambulatory Visit: Payer: Self-pay | Admitting: *Deleted

## 2024-12-28 DIAGNOSIS — E890 Postprocedural hypothyroidism: Secondary | ICD-10-CM

## 2024-12-28 NOTE — Telephone Encounter (Signed)
 Copied from CRM [id number]. Topic: Clinical - Prescription Issue >> Dec 28, 2024  3:52 PM Alfonso ORN wrote: Reason for CRM: Happiness a pharmacist at  [facility] need an  okay to dispense a new  manufacture  for the  levothyroxine  (SYNTHROID ) 100 MCG tablet , can no longer use the original manufacturer  If have any further question regarding the medication can call the [phone]

## 2024-12-29 MED ORDER — LEVOTHYROXINE SODIUM 100 MCG PO TABS: 100.0000 ug | ORAL_TABLET | Freq: Every day | ORAL | 0 refills | Status: AC

## 2025-01-01 ENCOUNTER — Encounter: Payer: Self-pay | Admitting: Nurse Practitioner

## 2025-01-01 ENCOUNTER — Ambulatory Visit: Admitting: Nurse Practitioner

## 2025-01-01 VITALS — BP 110/82 | HR 92 | Temp 96.9°F | Ht <= 58 in | Wt 200.8 lb

## 2025-01-01 DIAGNOSIS — I10 Essential (primary) hypertension: Secondary | ICD-10-CM

## 2025-01-01 DIAGNOSIS — E2839 Other primary ovarian failure: Secondary | ICD-10-CM

## 2025-01-01 DIAGNOSIS — K7031 Alcoholic cirrhosis of liver with ascites: Secondary | ICD-10-CM

## 2025-01-01 DIAGNOSIS — M8580 Other specified disorders of bone density and structure, unspecified site: Secondary | ICD-10-CM | POA: Diagnosis not present

## 2025-01-01 DIAGNOSIS — J988 Other specified respiratory disorders: Secondary | ICD-10-CM | POA: Diagnosis not present

## 2025-01-01 DIAGNOSIS — E890 Postprocedural hypothyroidism: Secondary | ICD-10-CM

## 2025-01-01 DIAGNOSIS — Z6841 Body Mass Index (BMI) 40.0 and over, adult: Secondary | ICD-10-CM | POA: Diagnosis not present

## 2025-01-01 DIAGNOSIS — M5441 Lumbago with sciatica, right side: Secondary | ICD-10-CM

## 2025-01-01 DIAGNOSIS — E785 Hyperlipidemia, unspecified: Secondary | ICD-10-CM

## 2025-01-01 DIAGNOSIS — E66813 Obesity, class 3: Secondary | ICD-10-CM

## 2025-01-01 DIAGNOSIS — F331 Major depressive disorder, recurrent, moderate: Secondary | ICD-10-CM

## 2025-01-01 DIAGNOSIS — G8929 Other chronic pain: Secondary | ICD-10-CM

## 2025-01-01 MED ORDER — BENZONATATE 200 MG PO CAPS: 200.0000 mg | ORAL_CAPSULE | Freq: Three times a day (TID) | ORAL | 0 refills | Status: AC

## 2025-01-01 NOTE — Progress Notes (Signed)
 "   Careteam: Patient Care Team: Caro Harlene POUR, NP as PCP - General (Geriatric Medicine) Armbruster, Elspeth SQUIBB, MD as Consulting Physician (Gastroenterology) Sheldon Elspeth, MD as Consulting Physician (General Surgery)  PLACE OF SERVICE:  [facility]  Advanced Directive information Does Patient Have a Medical Advance Directive?: Yes, Type of Advance Directive: Out of facility DNR (pink MOST or yellow form), Pre-existing out of facility DNR order (yellow form or pink MOST form): Yellow form placed in chart (order not valid for inpatient use);Pink MOST form placed in chart (order not valid for inpatient use), Does patient want to make changes to medical advance directive?: No - Patient declined  Allergies[1]  Chief Complaint  Patient presents with   Medical Management of Chronic Issues    6 month follow-up. Discuss need for bone density, AWV, flu vaccine( ? Ok to get today) and covid booster (pharmacy)   Cough    Cough, fatigue, and wheezing. Patient was seen at urgent care and recently finished antibiotics.    HPI:  Discussed the use of AI scribe software for clinical note transcription with the patient, who gave verbal consent to proceed.  History of Present Illness Christina [name] is an 79 year old female with cirrhosis who presents for a routine follow-up and evaluation of persistent cough and fatigue.  She began feeling unwell on December 27th while in [place] , experiencing a persistent cough and fatigue. Upon returning home, her symptoms worsened, leading her son to take her to urgent care. She was treated with amoxicillin, doxycycline , benzonatate , prednisone , and an injection. Despite completing the antibiotics and most of the cough medication, she continues to experience a cough, tiredness, and some wheezing. COVID-19 and flu tests were negative. Chest xray negative. No fever or chills noted.   She is currently taking benzonatate  for her cough, with about three pills  remaining, which helps reduce nighttime coughing. She uses an albuterol  inhaler as needed for wheezing and coughing fits. She reports no chest congestion but notes nasal discharge when blowing her nose.   Her past medical history includes cirrhosis, for which she is under the care of a gastroenterologist. She is on spironolactone  25 mg daily and propranolol  10 mg twice daily. She is on lactulose  15 mL daily and furosemide  (Lasix ) half a tablet daily, with occasional bloating but no significant swelling.   She also takes Effexor  150 mg daily for depression, which she feels is generally effective, though her mood is currently affected by her illness.  She is also on Synthroid  100 mcg for hypothyroidism, with a TSH level due for evaluation.   Additionally, she takes pregabalin  (Lyrica ) twice daily for back pain related to sciatica.  No fever, chills, or significant shortness of breath. She experiences some bloating and occasional wheezing. No constipation, urination issues, palpitations, chest pain, or diarrhea.   Review of Systems:  Review of Systems  Constitutional:  Negative for chills, fever and weight loss.  HENT:  Negative for tinnitus.   Respiratory:  Positive for cough and shortness of breath (associated with coughing). Negative for sputum production.   Cardiovascular:  Negative for chest pain, palpitations and leg swelling.  Gastrointestinal:  Negative for abdominal pain, constipation, diarrhea and heartburn.  Genitourinary:  Negative for dysuria, frequency and urgency.  Musculoskeletal:  Negative for back pain, falls, joint pain and myalgias.  Skin: Negative.   Neurological:  Negative for dizziness and headaches.  Psychiatric/Behavioral:  Negative for depression and memory loss. The patient does not have insomnia.  Past Medical History:  Diagnosis Date   Abnormal EKG    Abnormal finding on thyroid  function test    Abnormal liver function test    Acute edema    Alcoholic  cirrhosis (HCC) 07/02/2016   Possible NASH overlap (MELD 13)   Anemia    Anxiety    Arthritis    Ascites    Back pain    Breast mass    Chronic low back pain    Complete right rotator cuff tear    Complication of anesthesia    during first surgery- woke up during surgery many years ago   Compression fracture of L4 lumbar vertebra    COVID-19 12/2023   Dermatitis, eczematoid    Difficulty breathing    Gallstones    History of adenocarcinoma of breast    Hypercholesterolemia    Hyperkalemia    Hypertension    Hypokalemia    Hypothyroidism    Insomnia    Knee pain    Leukocytosis    Lymphadenopathy    Macrocytosis    Memory loss or impairment    Menopause    MGUS (monoclonal gammopathy of unknown significance)    Osteoarthritis    Osteoarthritis of right knee    Other cirrhosis of liver (HCC)    Renal insufficiency syndrome    Right knee DJD    Solitary thyroid  nodule    Unspecified lump in the left breast, unspecified quadrant    Vitamin B 12 deficiency    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HCC)    Past Surgical History:  Procedure Laterality Date   BREAST SURGERY  2014   Removed lymphnodes   EYE SURGERY  2006   bilateral cataract   JOINT REPLACEMENT     right  (2017)and left knee   LAPAROSCOPIC CHOLECYSTECTOMY SINGLE SITE WITH INTRAOPERATIVE CHOLANGIOGRAM N/A 02/19/2018   Procedure: LAPAROSCOPIC CHOLECYSTECTOMY;  Surgeon: Sheldon Standing, MD;  Location: [facility];  Service: General;  Laterality: N/A;   LESION REMOVAL  09/2015   tubular adenoma-4 subcentimeter lesions   LIVER BIOPSY  02/19/2018   Procedure: LIVER BIOPSY;  Surgeon: Sheldon Standing, MD;  Location: [facility];  Service: General;;   MOUTH SURGERY  10/25/2023   Took all teeth out.   right thumb surgery     SKIN CANCER EXCISION Right    Arm   TONSILLECTOMY     TOTAL KNEE ARTHROPLASTY Right 09/05/2016   UMBILICAL HERNIA REPAIR  02/19/2018   Procedure: REPAIR OF INCARCERATED UMBILICAL HERNIA;  Surgeon: Sheldon Standing, MD;  Location: [facility];  Service: General;;   Social History:   reports that she has never smoked. She has never used smokeless tobacco. She reports current alcohol use. She reports that she does not use drugs.  Family History  Problem Relation Age of Onset   Breast cancer Mother    Colon cancer Neg Hx    Stomach cancer Neg Hx    Rectal cancer Neg Hx    Liver cancer Neg Hx    Esophageal cancer Neg Hx     Medications: Patient's Medications  New Prescriptions   No medications on file  Previous Medications   ALBUTEROL  (VENTOLIN  HFA) 108 (90 BASE) MCG/ACT INHALER    Inhale 2 puffs into the lungs every 6 (six) hours as needed for wheezing or shortness of breath.   BENZONATATE  (TESSALON ) 200 MG CAPSULE    Take 200 mg by mouth 3 (three) times daily.   CHOLECALCIFEROL  (VITAMIN D3) 2000 UNITS TABS    Take  1 tablet by mouth every morning.   FUROSEMIDE  (LASIX ) 20 MG TABLET    Take 0.5 tablets (10 mg total) by mouth daily.   IBUPROFEN  (ADVIL ) 200 MG TABLET    Take 200 mg by mouth as needed.   KETOCONAZOLE  (NIZORAL ) 2 % SHAMPOO    APPLY TO AFFECTED AREA(S) 2 TIMES PER WEEK   LACTULOSE  (CHRONULAC ) 10 GM/15ML SOLUTION    Take 15 mLs (10 g total) by mouth daily.   LEVOTHYROXINE  (SYNTHROID ) 100 MCG TABLET    Take 1 tablet (100 mcg total) by mouth daily.   LORAZEPAM  (ATIVAN ) 0.5 MG TABLET    Take 1 tablet (0.5 mg total) by mouth daily as needed for anxiety (prior to dental procedure).   MULTIPLE VITAMIN (MULTIVITAMIN) TABLET    Take 1 tablet by mouth daily.   PREGABALIN  (LYRICA ) 50 MG CAPSULE    Take 1 capsule (50 mg total) by mouth 2 (two) times daily.   PROPRANOLOL  (INDERAL ) 10 MG TABLET    Take 1 tablet (10 mg total) by mouth 2 (two) times daily.   SPIRONOLACTONE  (ALDACTONE ) 25 MG TABLET    Take 1 tablet (25 mg total) by mouth daily.   VENLAFAXINE  XR (EFFEXOR -XR) 150 MG 24 HR CAPSULE    TAKE 1 CAPSULE BY MOUTH DAILY WITH BREAKFAST   VITAMIN B-12 (CYANOCOBALAMIN ) 1000 MCG TABLET    Take  1,000 mcg by mouth every morning.  Modified Medications   No medications on file  Discontinued Medications   AMOXICILLIN-CLAVULANATE (AUGMENTIN) 875-125 MG TABLET    Take 1 tablet by mouth 2 (two) times daily.   DOXYCYCLINE  (VIBRA -TABS) 100 MG TABLET    Take 100 mg by mouth 2 (two) times daily.   PREDNISONE  (DELTASONE ) 20 MG TABLET    Take 40 mg by mouth every morning.    Physical Exam:  Vitals:   01/01/25 0929  BP: 110/82  Pulse: 92  Temp: (!) 96.9 F (36.1 C)  SpO2: 98%  Weight: 200 lb 12.8 oz (91.1 kg)  Height: 4' 10 (1.473 m)   Body mass index is 41.97 kg/m. Wt Readings from Last 3 Encounters:  01/01/25 200 lb 12.8 oz (91.1 kg)  03/31/24 195 lb 8 oz (88.7 kg)  03/16/24 194 lb 6.4 oz (88.2 kg)    Physical Exam Constitutional:      General: She is not in acute distress.    Appearance: She is well-developed. She is not diaphoretic.  HENT:     Head: Normocephalic and atraumatic.     Right Ear: Tympanic membrane, ear canal and external ear normal.     Left Ear: Tympanic membrane, ear canal and external ear normal.     Nose: Congestion and rhinorrhea present.     Mouth/Throat:     Pharynx: No oropharyngeal exudate.  Eyes:     Conjunctiva/sclera: Conjunctivae normal.     Pupils: Pupils are equal, round, and reactive to light.  Cardiovascular:     Rate and Rhythm: Normal rate and regular rhythm.     Heart sounds: Normal heart sounds.  Pulmonary:     Effort: Pulmonary effort is normal.     Breath sounds: Normal breath sounds. No wheezing, rhonchi or rales.  Chest:     Chest wall: No tenderness.  Abdominal:     General: Bowel sounds are normal.     Palpations: Abdomen is soft.  Musculoskeletal:     Cervical back: Normal range of motion and neck supple.     Right lower leg: No  edema.     Left lower leg: No edema.  Skin:    General: Skin is warm and dry.  Neurological:     Mental Status: She is alert.  Psychiatric:        Mood and Affect: Mood normal.      Labs reviewed: Basic Metabolic Panel: Recent Labs    03/16/24 1407  NA 137  K 4.6  CL 99  CO2 30  GLUCOSE 77  BUN 14  CREATININE 0.72  CALCIUM 10.0   Liver Function Tests: Recent Labs    03/16/24 1407  AST 22  ALT 12  BILITOT 0.6  PROT 7.5   No results for input(s): LIPASE, AMYLASE in the last 8760 hours. No results for input(s): AMMONIA in the last 8760 hours. CBC: Recent Labs    03/16/24 1407  WBC 7.2  NEUTROABS 4,471  HGB 13.0  HCT 39.2  MCV 93.6  PLT 243   Lipid Panel: Recent Labs    03/16/24 1407  CHOL 218*  HDL 77  LDLCALC 120*  TRIG 103  CHOLHDL 2.8   TSH: No results for input(s): TSH in the last 8760 hours. A1C: No results found for: HGBA1C   Assessment/Plan  Assessment & Plan Acute upper respiratory infection Persistent cough and fatigue likely due to residual inflammation from a respiratory virus. Lungs clear, no pneumonia or COVID-19. - Add Mucinex DM for cough and mucus management. - Increase water  intake and use a humidifier. - Continue albuterol  inhaler as needed. - Refilled Tessalon  Perles for cough management. - Encouraged nutritional supplements like Ensure or Boost.  Alcoholic cirrhosis of liver with ascites Cirrhosis managed with lactulose , lasix , spironolactone  and propranolol . Occasional bloating noted. Ultrasound scheduled for evaluation by GI Continue current regimen - keep GI follow up  Major depressive disorder, recurrent, moderate Depression managed with Effexor . Mood affected by recent illness but overall stable. - Continue Effexor  150 mg daily.  Postprocedural hypothyroidism Hypothyroidism managed with Synthroid . TSH level evaluation due. - Ordered TSH level.  Chronic right-sided low back pain with sciatica Chronic back pain managed with Lyrica . Pain not well localized due to illness. - Continue Lyrica  for back pain management.  Osteopenia Bone density screening is due. - Schedule bone  density screening.  Hyperlipidemia Borderline hyperlipidemia managed with dietary modifications. - Ordered cholesterol level.  General Health Maintenance Routine health maintenance is due. - to get flu vaccination once you feel better - Schedule mammogram. - Schedule bone density screening.   Return in about 6 months (around 07/01/2025) for routine follow up.  [place] Arch K. Caro BODILY [facility] [phone]     [1] No Known Allergies  "

## 2025-01-01 NOTE — Patient Instructions (Addendum)
 1.) Visit local pharmacy to receive additional COVID boosters 2. ) due for flu shot- get at local pharmacy or office   2.) Schedule annual wellness visit at checkout   3.) ADD MUCINEX DM by mouth twice daily with full glass of water  -increase water  intake.  Netipot or saline wash daily Plain nasal saline spray throughout the day as needed May use tylenol  325 mg 2 tablets every 6-8 hours as needed aches and pains or sore throat humidifier in the home to help with the dry air Keep well hydrated Proper nutrition  Avoid forcefully blowing nose

## 2025-01-02 LAB — CBC WITH DIFFERENTIAL/PLATELET
Absolute Lymphocytes: 2551 {cells}/uL (ref 850–3900)
Absolute Monocytes: 1299 {cells}/uL — ABNORMAL HIGH (ref 200–950)
Basophils Absolute: 82 {cells}/uL (ref 0–200)
Basophils Relative: 0.7 %
Eosinophils Absolute: 234 {cells}/uL (ref 15–500)
Eosinophils Relative: 2 %
HCT: 38.9 % (ref 35.9–46.0)
Hemoglobin: 12.9 g/dL (ref 11.7–15.5)
MCH: 30.9 pg (ref 27.0–33.0)
MCHC: 33.2 g/dL (ref 31.6–35.4)
MCV: 93.3 fL (ref 81.4–101.7)
MPV: 10.4 fL (ref 7.5–12.5)
Monocytes Relative: 11.1 %
Neutro Abs: 7535 {cells}/uL (ref 1500–7800)
Neutrophils Relative %: 64.4 %
Platelets: 265 Thousand/uL (ref 140–400)
RBC: 4.17 Million/uL (ref 3.80–5.10)
RDW: 11.7 % (ref 11.0–15.0)
Total Lymphocyte: 21.8 %
WBC: 11.7 Thousand/uL — ABNORMAL HIGH (ref 3.8–10.8)

## 2025-01-02 LAB — COMPREHENSIVE METABOLIC PANEL WITH GFR
AG Ratio: 1.2 (calc) (ref 1.0–2.5)
ALT: 15 U/L (ref 6–29)
AST: 18 U/L (ref 10–35)
Albumin: 3.6 g/dL (ref 3.6–5.1)
Alkaline phosphatase (APISO): 66 U/L (ref 37–153)
BUN: 11 mg/dL (ref 7–25)
CO2: 31 mmol/L (ref 20–32)
Calcium: 9.4 mg/dL (ref 8.6–10.4)
Chloride: 104 mmol/L (ref 98–110)
Creat: 0.71 mg/dL (ref 0.60–0.95)
Globulin: 3.1 g/dL (ref 1.9–3.7)
Glucose, Bld: 101 mg/dL (ref 65–139)
Potassium: 4.2 mmol/L (ref 3.5–5.3)
Sodium: 142 mmol/L (ref 135–146)
Total Bilirubin: 0.6 mg/dL (ref 0.2–1.2)
Total Protein: 6.7 g/dL (ref 6.1–8.1)
eGFR: 86 mL/min/1.73m2

## 2025-01-02 LAB — LIPID PANEL
Cholesterol: 208 mg/dL — ABNORMAL HIGH
HDL: 70 mg/dL
LDL Cholesterol (Calc): 118 mg/dL — ABNORMAL HIGH
Non-HDL Cholesterol (Calc): 138 mg/dL — ABNORMAL HIGH
Total CHOL/HDL Ratio: 3 (calc)
Triglycerides: 97 mg/dL

## 2025-01-02 LAB — TSH: TSH: 9.9 m[IU]/L — ABNORMAL HIGH (ref 0.40–4.50)

## 2025-01-05 ENCOUNTER — Ambulatory Visit: Payer: Self-pay | Admitting: Nurse Practitioner

## 2025-01-05 DIAGNOSIS — E890 Postprocedural hypothyroidism: Secondary | ICD-10-CM

## 2025-02-05 ENCOUNTER — Other Ambulatory Visit

## 2025-07-12 ENCOUNTER — Ambulatory Visit: Admitting: Nurse Practitioner
